# Patient Record
Sex: Male | Born: 1950
Health system: Southern US, Community
[De-identification: ages and names within clinical notes are randomized; demographics above are authoritative.]

## PROBLEM LIST (undated history)

## (undated) DIAGNOSIS — E785 Hyperlipidemia, unspecified: Secondary | ICD-10-CM

## (undated) DIAGNOSIS — Z972 Presence of dental prosthetic device (complete) (partial): Secondary | ICD-10-CM

## (undated) DIAGNOSIS — I10 Essential (primary) hypertension: Secondary | ICD-10-CM

## (undated) DIAGNOSIS — C801 Malignant (primary) neoplasm, unspecified: Secondary | ICD-10-CM

## (undated) DIAGNOSIS — R310 Gross hematuria: Secondary | ICD-10-CM

## (undated) DIAGNOSIS — M1990 Unspecified osteoarthritis, unspecified site: Secondary | ICD-10-CM

## (undated) DIAGNOSIS — S8000XA Contusion of unspecified knee, initial encounter: Secondary | ICD-10-CM

## (undated) DIAGNOSIS — C61 Malignant neoplasm of prostate: Secondary | ICD-10-CM

## (undated) DIAGNOSIS — G629 Polyneuropathy, unspecified: Secondary | ICD-10-CM

## (undated) DIAGNOSIS — D869 Sarcoidosis, unspecified: Secondary | ICD-10-CM

## (undated) DIAGNOSIS — N261 Atrophy of kidney (terminal): Secondary | ICD-10-CM

## (undated) DIAGNOSIS — K219 Gastro-esophageal reflux disease without esophagitis: Secondary | ICD-10-CM

## (undated) DIAGNOSIS — N200 Calculus of kidney: Secondary | ICD-10-CM

## (undated) DIAGNOSIS — E119 Type 2 diabetes mellitus without complications: Secondary | ICD-10-CM

## (undated) DIAGNOSIS — Z974 Presence of external hearing-aid: Secondary | ICD-10-CM

## (undated) DIAGNOSIS — Z9889 Other specified postprocedural states: Secondary | ICD-10-CM

## (undated) DIAGNOSIS — N529 Male erectile dysfunction, unspecified: Secondary | ICD-10-CM

## (undated) DIAGNOSIS — J449 Chronic obstructive pulmonary disease, unspecified: Secondary | ICD-10-CM

## (undated) DIAGNOSIS — E041 Nontoxic single thyroid nodule: Secondary | ICD-10-CM

## (undated) DIAGNOSIS — Z903 Acquired absence of stomach [part of]: Secondary | ICD-10-CM

## (undated) DIAGNOSIS — K279 Peptic ulcer, site unspecified, unspecified as acute or chronic, without hemorrhage or perforation: Secondary | ICD-10-CM

## (undated) HISTORY — PX: APPENDECTOMY: SHX54

## (undated) HISTORY — DX: Atrophy of kidney (terminal): N26.1

## (undated) HISTORY — DX: Other specified postprocedural states: Z98.890

## (undated) HISTORY — DX: Gastro-esophageal reflux disease without esophagitis: K21.9

## (undated) HISTORY — PX: EYE SURGERY: SHX253

## (undated) HISTORY — PX: OTHER SURGICAL HISTORY: SHX169

## (undated) HISTORY — DX: Sarcoidosis, unspecified: D86.9

## (undated) HISTORY — DX: Peptic ulcer, site unspecified, unspecified as acute or chronic, without hemorrhage or perforation: K27.9

## (undated) HISTORY — DX: Acquired absence of stomach (part of): Z90.3

## (undated) HISTORY — DX: Hyperlipidemia, unspecified: E78.5

## (undated) HISTORY — DX: Contusion of unspecified knee, initial encounter: S80.00XA

## (undated) HISTORY — DX: Male erectile dysfunction, unspecified: N52.9

## (undated) HISTORY — DX: Polyneuropathy, unspecified: G62.9

## (undated) HISTORY — DX: Gross hematuria: R31.0

## (undated) HISTORY — DX: Nontoxic single thyroid nodule: E04.1

## (undated) HISTORY — DX: Essential (primary) hypertension: I10

## (undated) HISTORY — DX: Chronic obstructive pulmonary disease, unspecified: J44.9

## (undated) HISTORY — DX: Calculus of kidney: N20.0

## (undated) HISTORY — DX: Unspecified osteoarthritis, unspecified site: M19.90

## (undated) HISTORY — PX: CHOLECYSTECTOMY: SHX55

## (undated) HISTORY — DX: Type 2 diabetes mellitus without complications: E11.9

## (undated) HISTORY — PX: GASTRECTOMY: SHX58

---

## 2000-05-04 HISTORY — PX: INSERTION PROSTATE RADIATION SEED: SUR718

## 2012-10-11 DIAGNOSIS — Z903 Acquired absence of stomach [part of]: Secondary | ICD-10-CM

## 2012-10-11 DIAGNOSIS — Z9889 Other specified postprocedural states: Secondary | ICD-10-CM | POA: Insufficient documentation

## 2012-10-11 HISTORY — DX: Acquired absence of stomach (part of): Z90.3

## 2012-10-11 HISTORY — DX: Other specified postprocedural states: Z98.890

## 2013-01-13 DIAGNOSIS — N261 Atrophy of kidney (terminal): Secondary | ICD-10-CM

## 2013-01-13 HISTORY — DX: Atrophy of kidney (terminal): N26.1

## 2013-07-12 DIAGNOSIS — E559 Vitamin D deficiency, unspecified: Secondary | ICD-10-CM | POA: Insufficient documentation

## 2013-10-20 DIAGNOSIS — S8000XA Contusion of unspecified knee, initial encounter: Secondary | ICD-10-CM

## 2013-10-20 HISTORY — DX: Contusion of unspecified knee, initial encounter: S80.00XA

## 2014-07-17 DIAGNOSIS — N200 Calculus of kidney: Secondary | ICD-10-CM | POA: Insufficient documentation

## 2015-04-04 DIAGNOSIS — E785 Hyperlipidemia, unspecified: Secondary | ICD-10-CM | POA: Insufficient documentation

## 2015-04-04 DIAGNOSIS — D869 Sarcoidosis, unspecified: Secondary | ICD-10-CM

## 2015-04-04 HISTORY — DX: Sarcoidosis, unspecified: D86.9

## 2016-03-16 ENCOUNTER — Ambulatory Visit
Admission: EM | Admit: 2016-03-16 | Discharge: 2016-03-16 | Disposition: A | Discharge status: Home / Self Care | Payer: Medicare Other | Attending: Family Medicine | Admitting: Family Medicine

## 2016-03-16 ENCOUNTER — Encounter: Payer: Self-pay | Admitting: *Deleted

## 2016-03-16 DIAGNOSIS — R109 Unspecified abdominal pain: Secondary | ICD-10-CM | POA: Insufficient documentation

## 2016-03-16 DIAGNOSIS — Z8546 Personal history of malignant neoplasm of prostate: Secondary | ICD-10-CM | POA: Diagnosis not present

## 2016-03-16 DIAGNOSIS — N3090 Cystitis, unspecified without hematuria: Secondary | ICD-10-CM

## 2016-03-16 DIAGNOSIS — N3091 Cystitis, unspecified with hematuria: Secondary | ICD-10-CM | POA: Insufficient documentation

## 2016-03-16 DIAGNOSIS — Z87891 Personal history of nicotine dependence: Secondary | ICD-10-CM | POA: Insufficient documentation

## 2016-03-16 DIAGNOSIS — Z87442 Personal history of urinary calculi: Secondary | ICD-10-CM | POA: Diagnosis not present

## 2016-03-16 DIAGNOSIS — Z9049 Acquired absence of other specified parts of digestive tract: Secondary | ICD-10-CM | POA: Diagnosis not present

## 2016-03-16 DIAGNOSIS — Z79899 Other long term (current) drug therapy: Secondary | ICD-10-CM | POA: Diagnosis not present

## 2016-03-16 DIAGNOSIS — Z9889 Other specified postprocedural states: Secondary | ICD-10-CM | POA: Insufficient documentation

## 2016-03-16 HISTORY — DX: Malignant neoplasm of prostate: C61

## 2016-03-16 HISTORY — DX: Malignant (primary) neoplasm, unspecified: C80.1

## 2016-03-16 LAB — URINALYSIS COMPLETE WITH MICROSCOPIC (ARMC ONLY)
GLUCOSE, UA: NEGATIVE mg/dL
KETONES UR: NEGATIVE mg/dL
NITRITE: NEGATIVE
Protein, ur: >300 mg/dL — AB
Specific Gravity, Urine: 1.015 (ref 1.005–1.030)
Squamous Epithelial / LPF: NONE SEEN
pH: 5 (ref 5.0–8.0)

## 2016-03-16 LAB — BASIC METABOLIC PANEL
Anion gap: 9 (ref 5–15)
BUN: 21 mg/dL — ABNORMAL HIGH (ref 6–20)
CHLORIDE: 102 mmol/L (ref 101–111)
CO2: 27 mmol/L (ref 22–32)
CREATININE: 1.36 mg/dL — AB (ref 0.61–1.24)
Calcium: 9.6 mg/dL (ref 8.9–10.3)
GFR calc non Af Amer: 52 mL/min — ABNORMAL LOW (ref >60)
GLUCOSE: 83 mg/dL (ref 65–99)
Potassium: 4 mmol/L (ref 3.5–5.1)
Sodium: 138 mmol/L (ref 135–145)

## 2016-03-16 MED ORDER — CIPROFLOXACIN HCL 500 MG PO TABS: 500.0000 mg | ORAL_TABLET | Freq: Two times a day (BID) | ORAL | 0 refills | Status: DC

## 2016-03-16 MED ORDER — CIPROFLOXACIN HCL 500 MG PO TABS: ORAL_TABLET | ORAL | 0 refills | Status: DC

## 2016-03-16 NOTE — ED Provider Notes (Signed)
[facility]    CSN: [id number] Arrival date & time: 03/16/16  0854     History   Chief Complaint Chief Complaint  Patient presents with  . Hematuria  . Flank Pain    HPI Juan [name] is a 65 y.o. male.   The history is provided by the patient.  Hematuria  This is a new problem. The current episode started 2 days ago. The problem occurs constantly. The problem has not changed since onset.Associated symptoms comments: Denies any fevers, chills, dysuria, vomiting, abdominal pain, flank pain.   Patient reports a h/o kidney stones but symptoms different (pain present) in the past. Also has a history of prostate cancer and receives Lupron treatments. . He has tried nothing for the symptoms.  Flank Pain     Past Medical History:  Diagnosis Date  . Cancer ([place])   . Prostate cancer ([place])     There are no active problems to display for this patient.   Past Surgical History:  Procedure Laterality Date  . APPENDECTOMY    . CHOLECYSTECTOMY    . GASTRECTOMY    . nasal tumor         Home Medications    Prior to Admission medications   Medication Sig Start Date End Date Taking? Authorizing Provider  atorvastatin (LIPITOR) 10 MG tablet Take 10 mg by mouth daily.   Yes Historical Provider, MD  dutasteride (AVODART) 0.5 MG capsule Take 0.5 mg by mouth daily.   Yes Historical Provider, MD  leuprolide (LUPRON) 3.75 MG injection Inject 3.75 mg into the muscle once.   Yes Historical Provider, MD  pantoprazole (PROTONIX) 40 MG tablet Take 40 mg by mouth daily.   Yes Historical Provider, MD  ciprofloxacin (CIPRO) 500 MG tablet 1 tab po qd 03/16/16   Norval Gable, MD    Family History History reviewed. No pertinent family history.  Social History Social History  Substance Use Topics  . Smoking status: Former Research scientist (life sciences)  . Smokeless tobacco: Never Used  . Alcohol use Yes     Allergies   Nsaids   Review of Systems Review of Systems  Genitourinary:  Positive for flank pain and hematuria.     Physical Exam Triage Vital Signs ED Triage Vitals  Enc Vitals Group     BP 03/16/16 0944 (!) 166/78     Pulse Rate 03/16/16 0944 (!) 54     Resp 03/16/16 0944 16     Temp 03/16/16 0944 98.4 F (36.9 C)     Temp Source 03/16/16 0944 Oral     SpO2 03/16/16 0944 98 %     Weight 03/16/16 0946 215 lb (97.5 kg)     Height 03/16/16 0946 5\' 8"  (1.727 m)     Head Circumference --      Peak Flow --      Pain Score 03/16/16 1100 1     Pain Loc --      Pain Edu? --      Excl. in [place]? --    No data found.   Updated Vital Signs BP (!) 166/78 (BP Location: Right Arm)   Pulse (!) 54   Temp 98.4 F (36.9 C) (Oral)   Resp 16   Ht 5\' 8"  (1.727 m)   Wt 215 lb (97.5 kg)   SpO2 98%   BMI 32.69 kg/m   Visual Acuity Right Eye Distance:   Left Eye Distance:   Bilateral Distance:    Right Eye Near:   Left  Eye Near:    Bilateral Near:     Physical Exam  Constitutional: He is oriented to person, place, and time. He appears well-developed and well-nourished. No distress.  HENT:  Head: Normocephalic and atraumatic.  Cardiovascular: Normal rate.   No murmur heard. Pulmonary/Chest: Effort normal. No respiratory distress.  Abdominal: Soft. Bowel sounds are normal. He exhibits no distension and no mass. There is no tenderness. There is no rebound and no guarding.  Genitourinary: Prostate is tender.  Neurological: He is alert and oriented to person, place, and time.  Skin: No rash noted. He is not diaphoretic.  Nursing note and vitals reviewed.    UC Treatments / Results  Labs (all labs ordered are listed, but only abnormal results are displayed) Labs Reviewed  URINALYSIS COMPLETEWITH MICROSCOPIC ([place]) - Abnormal; Notable for the following:       Result Value   Color, Urine BROWN (*)    APPearance TURBID (*)    Bilirubin Urine SMALL (*)    Hgb urine dipstick LARGE (*)    Protein, ur >300 (*)    Leukocytes, UA SMALL (*)     Bacteria, UA FEW (*)    All other components within normal limits  BASIC METABOLIC PANEL - Abnormal; Notable for the following:    BUN 21 (*)    Creatinine, Ser 1.36 (*)    GFR calc non Af Amer 52 (*)    All other components within normal limits    EKG  EKG Interpretation None       Radiology No results found.  Procedures Procedures (including critical care time)  Medications Ordered in UC Medications - No data to display   Initial Impression / Assessment and Plan / UC Course  I have reviewed the triage vital signs and the nursing notes.  Pertinent labs & imaging results that were available during my care of the patient were reviewed by me and considered in my medical decision making (see chart for details).  Clinical Course       Final Clinical Impressions(s) / UC Diagnoses   Final diagnoses:  Cystitis    New Prescriptions Discharge Medication List as of 03/16/2016 10:55 AM    START taking these medications   Details  ciprofloxacin (CIPRO) 500 MG tablet Take 1 tablet (500 mg total) by mouth every 12 (twelve) hours., Starting Mon 03/16/2016, Normal       1. Lab results and diagnosis reviewed with patient 2. rx as per orders above; reviewed possible side effects, interactions, risks and benefits  3. Recommend supportive treatment with increased fluids 4. Follow-up prn if symptoms worsen or don't improve   Norval Gable, MD 03/16/16 1140

## 2016-03-16 NOTE — ED Triage Notes (Signed)
Hx of prostate cancer.

## 2016-03-16 NOTE — ED Triage Notes (Signed)
Hematuria since yesterday, mild left flank pain this am. Hx of previous kidney stones. Left kidney non-functional from repeated stones.

## 2016-05-13 DIAGNOSIS — N390 Urinary tract infection, site not specified: Secondary | ICD-10-CM | POA: Diagnosis not present

## 2016-05-28 DIAGNOSIS — E559 Vitamin D deficiency, unspecified: Secondary | ICD-10-CM | POA: Diagnosis not present

## 2016-05-28 DIAGNOSIS — C61 Malignant neoplasm of prostate: Secondary | ICD-10-CM | POA: Diagnosis not present

## 2016-07-09 DIAGNOSIS — Z87891 Personal history of nicotine dependence: Secondary | ICD-10-CM | POA: Diagnosis not present

## 2016-07-09 DIAGNOSIS — E559 Vitamin D deficiency, unspecified: Secondary | ICD-10-CM | POA: Diagnosis not present

## 2016-07-09 DIAGNOSIS — C61 Malignant neoplasm of prostate: Secondary | ICD-10-CM | POA: Diagnosis not present

## 2016-07-10 DIAGNOSIS — K219 Gastro-esophageal reflux disease without esophagitis: Secondary | ICD-10-CM | POA: Diagnosis not present

## 2016-07-10 DIAGNOSIS — E669 Obesity, unspecified: Secondary | ICD-10-CM | POA: Diagnosis not present

## 2016-07-10 DIAGNOSIS — E559 Vitamin D deficiency, unspecified: Secondary | ICD-10-CM | POA: Diagnosis not present

## 2016-07-10 DIAGNOSIS — K912 Postsurgical malabsorption, not elsewhere classified: Secondary | ICD-10-CM | POA: Diagnosis not present

## 2016-07-10 DIAGNOSIS — Z713 Dietary counseling and surveillance: Secondary | ICD-10-CM | POA: Diagnosis not present

## 2016-07-10 DIAGNOSIS — Z9884 Bariatric surgery status: Secondary | ICD-10-CM | POA: Diagnosis not present

## 2016-07-10 DIAGNOSIS — Z9889 Other specified postprocedural states: Secondary | ICD-10-CM | POA: Diagnosis not present

## 2016-07-10 DIAGNOSIS — Z903 Acquired absence of stomach [part of]: Secondary | ICD-10-CM | POA: Diagnosis not present

## 2016-07-21 DIAGNOSIS — R8290 Unspecified abnormal findings in urine: Secondary | ICD-10-CM | POA: Diagnosis not present

## 2016-07-21 DIAGNOSIS — N200 Calculus of kidney: Secondary | ICD-10-CM | POA: Diagnosis not present

## 2016-07-21 DIAGNOSIS — R310 Gross hematuria: Secondary | ICD-10-CM | POA: Diagnosis not present

## 2016-07-22 DIAGNOSIS — R310 Gross hematuria: Secondary | ICD-10-CM | POA: Insufficient documentation

## 2016-07-22 HISTORY — DX: Gross hematuria: R31.0

## 2016-08-07 DIAGNOSIS — H35342 Macular cyst, hole, or pseudohole, left eye: Secondary | ICD-10-CM | POA: Diagnosis not present

## 2016-08-20 DIAGNOSIS — E559 Vitamin D deficiency, unspecified: Secondary | ICD-10-CM | POA: Diagnosis not present

## 2016-08-20 DIAGNOSIS — C61 Malignant neoplasm of prostate: Secondary | ICD-10-CM | POA: Diagnosis not present

## 2016-08-27 ENCOUNTER — Other Ambulatory Visit: Payer: Self-pay

## 2016-08-27 DIAGNOSIS — Z87442 Personal history of urinary calculi: Secondary | ICD-10-CM

## 2016-08-28 ENCOUNTER — Other Ambulatory Visit
Admission: RE | Admit: 2016-08-28 | Discharge: 2016-08-28 | Disposition: A | Discharge status: Home / Self Care | Payer: Medicare Other | Source: Ambulatory Visit | Attending: Urology | Admitting: Urology

## 2016-08-28 ENCOUNTER — Encounter: Payer: Self-pay | Admitting: Urology

## 2016-08-28 ENCOUNTER — Ambulatory Visit (INDEPENDENT_AMBULATORY_CARE_PROVIDER_SITE_OTHER): Payer: Medicare Other | Admitting: Urology

## 2016-08-28 VITALS — BP 175/93 | HR 53 | Ht 68.0 in | Wt 218.0 lb

## 2016-08-28 DIAGNOSIS — R310 Gross hematuria: Secondary | ICD-10-CM

## 2016-08-28 DIAGNOSIS — Z8719 Personal history of other diseases of the digestive system: Secondary | ICD-10-CM | POA: Insufficient documentation

## 2016-08-28 DIAGNOSIS — N261 Atrophy of kidney (terminal): Secondary | ICD-10-CM | POA: Diagnosis not present

## 2016-08-28 DIAGNOSIS — C61 Malignant neoplasm of prostate: Secondary | ICD-10-CM | POA: Diagnosis not present

## 2016-08-28 DIAGNOSIS — E119 Type 2 diabetes mellitus without complications: Secondary | ICD-10-CM

## 2016-08-28 DIAGNOSIS — N200 Calculus of kidney: Secondary | ICD-10-CM | POA: Insufficient documentation

## 2016-08-28 DIAGNOSIS — K279 Peptic ulcer, site unspecified, unspecified as acute or chronic, without hemorrhage or perforation: Secondary | ICD-10-CM

## 2016-08-28 DIAGNOSIS — Z87442 Personal history of urinary calculi: Secondary | ICD-10-CM | POA: Diagnosis not present

## 2016-08-28 DIAGNOSIS — J449 Chronic obstructive pulmonary disease, unspecified: Secondary | ICD-10-CM

## 2016-08-28 DIAGNOSIS — I10 Essential (primary) hypertension: Secondary | ICD-10-CM

## 2016-08-28 DIAGNOSIS — E669 Obesity, unspecified: Secondary | ICD-10-CM | POA: Insufficient documentation

## 2016-08-28 DIAGNOSIS — E1140 Type 2 diabetes mellitus with diabetic neuropathy, unspecified: Secondary | ICD-10-CM | POA: Insufficient documentation

## 2016-08-28 DIAGNOSIS — K219 Gastro-esophageal reflux disease without esophagitis: Secondary | ICD-10-CM | POA: Insufficient documentation

## 2016-08-28 DIAGNOSIS — N529 Male erectile dysfunction, unspecified: Secondary | ICD-10-CM | POA: Insufficient documentation

## 2016-08-28 DIAGNOSIS — M1990 Unspecified osteoarthritis, unspecified site: Secondary | ICD-10-CM

## 2016-08-28 DIAGNOSIS — E785 Hyperlipidemia, unspecified: Secondary | ICD-10-CM | POA: Insufficient documentation

## 2016-08-28 DIAGNOSIS — Z8639 Personal history of other endocrine, nutritional and metabolic disease: Secondary | ICD-10-CM | POA: Insufficient documentation

## 2016-08-28 HISTORY — DX: Chronic obstructive pulmonary disease, unspecified: J44.9

## 2016-08-28 HISTORY — DX: Unspecified osteoarthritis, unspecified site: M19.90

## 2016-08-28 HISTORY — DX: Peptic ulcer, site unspecified, unspecified as acute or chronic, without hemorrhage or perforation: K27.9

## 2016-08-28 HISTORY — DX: Calculus of kidney: N20.0

## 2016-08-28 HISTORY — DX: Type 2 diabetes mellitus without complications: E11.9

## 2016-08-28 HISTORY — DX: Male erectile dysfunction, unspecified: N52.9

## 2016-08-28 HISTORY — DX: Essential (primary) hypertension: I10

## 2016-08-28 LAB — URINALYSIS, COMPLETE (UACMP) WITH MICROSCOPIC
Bilirubin Urine: NEGATIVE
GLUCOSE, UA: NEGATIVE mg/dL
KETONES UR: NEGATIVE mg/dL
Nitrite: NEGATIVE
PROTEIN: NEGATIVE mg/dL
SQUAMOUS EPITHELIAL / LPF: NONE SEEN
Specific Gravity, Urine: 1.01 (ref 1.005–1.030)
pH: 5.5 (ref 5.0–8.0)

## 2016-08-28 NOTE — Progress Notes (Signed)
08/28/2016 2:17 PM   Juan [name] 1950/06/10 [id number]  Referring provider: Donzetta Kohut, DO [address] [place], [place] [id number]  Chief Complaint  Patient presents with  . Prostate Cancer    New Patient  . Establish Care    HPI: 65 year old male here to establish care for history of prostate cancer. He was previously seeing a urologist in [place] (Dr. Edwin Dada) but recently moved to [facility] and seeking local care.    He has a personal history of a nonfunctioning left kidney, possibly secondary to an embolic event. He also has a history of left ureteral stones and stricture without return of function following stricture dilations and stent placement. Based on a Lasix renogram from 9/14, left kidney provides only approximate 5% of overall function. He has a known 5 mm right lower pole stone.   He also has a personal history of castrate resistant prostate cancer. He underwent primary brachytherapy in 2003 and has been managed since with intermittent ADT and is followed by Dr. Alvester Chou.  He is currently on Casodex, Avodart, and Lupron.    Most recently, he had 2 episodes of painless gross hematuria this past December and January. He had no associated dysuria, or flank pain. He is counseled to undergo workup for his hematuria which has not yet been performed.  He does have a personal history of CKD stage III. His most recent creatinine was 1.29 on 07/21/2016.   PMH: Past Medical History:  Diagnosis Date  . Arthritis 08/28/2016  . Atrophic kidney 01/13/2013   Last Assessment & Plan:  Atrophic and nonfunctional LEFT kidney.  Discussed at length today. Asymptomatic. No intervention needed.  Overview:  Last Assessment & Plan:  Atrophic and nonfunctional LEFT kidney.  Discussed at length today. Asymptomatic. No intervention needed. Last Assessment & Plan:  Atrophic and nonfunctional LEFT kidney.  Discussed at length today. Asymptomatic. No intervention needed.  . Cancer  ([place])   . COPD (chronic obstructive pulmonary disease) ([place]) 08/28/2016  . Diabetes mellitus type 2, uncomplicated ([place]) 06/27/8248  . ED (erectile dysfunction) 08/28/2016  . GERD (gastroesophageal reflux disease)   . Gross hematuria 07/22/2016   Overview:  Last Assessment & Plan:  The differential diagnosis for hematuria was reviewed.  Possible etiologies include, but are not limited to urolithiasis, infection, urothelial or renal malignancies, medical renal disease, and benign idiopathic hematuria.  The workup was reviewed and he will need a urinary cytology testing, a CT urogram, and Cystourethroscopy.  He has moved to [place], [place] and would like to have all this done with a local urologist since it is an hour and a half drive. Will send records there and have the work performed locally.   > 15 minutes were spent in face to face contact with the patient, >50% of which was spent counseling about about the diagnosis and plan.  Last Assessment & Plan:  The differential diagnosis for hematuria was reviewed.  Possible etiologies include, but are not limited to urolithiasis, infection, urothelial or renal malignancies, medical renal disease, and benign idiopathic hematuria.  The workup was reviewed and he will need a urinary cytology testing,   . H/O vagotomy 10/11/2012  . Hyperlipemia   . Hypertension 08/28/2016  . Kidney stones 08/28/2016   Last Assessment & Plan:  Can't see stone on KUB today again  Pt instructed to call or go to ER for acute flank pain since he only has one functioning kidney.  Currently no evidence of any obstructive  uropathy.  Check chem 8 and call with results.  F/u one year for KUB  . Knee contusion 10/20/2013  . Peptic ulcer 08/28/2016  . Prostate cancer ([place])   . Sarcoidosis 04/04/2015  . Status post partial gastrectomy 10/11/2012    Surgical History: Past Surgical History:  Procedure Laterality Date  . APPENDECTOMY    . CHOLECYSTECTOMY    . GASTRECTOMY    . INSERTION  PROSTATE RADIATION SEED  2002   Brachytherapy  . nasal tumor      Home Medications:  Allergies as of 08/28/2016      Reactions   Nsaids Other (See Comments)   Hx of PUD      Medication List       Accurate as of 08/28/16 11:59 PM. Always use your most recent med list.          atorvastatin 10 MG tablet Commonly known as:  LIPITOR Take 10 mg by mouth daily.   bicalutamide 50 MG tablet Commonly known as:  CASODEX   dutasteride 0.5 MG capsule Commonly known as:  AVODART Take 0.5 mg by mouth daily.   leuprolide 3.75 MG injection Commonly known as:  LUPRON Inject 3.75 mg into the muscle once.   pantoprazole 40 MG tablet Commonly known as:  PROTONIX Take 40 mg by mouth daily.       Allergies:  Allergies  Allergen Reactions  . Nsaids Other (See Comments)    Hx of PUD    Family History: Family History  Problem Relation Age of Onset  . Prostate cancer Neg Hx   . Kidney cancer Neg Hx     Social History:  reports that he has quit smoking. He has never used smokeless tobacco. He reports that he drinks alcohol. He reports that he does not use drugs.  ROS: UROLOGY Frequent Urination?: No Hard to postpone urination?: No Burning/pain with urination?: No Get up at night to urinate?: Yes Leakage of urine?: No Urine stream starts and stops?: Yes Trouble starting stream?: No Do you have to strain to urinate?: No Blood in urine?: Yes Urinary tract infection?: Yes Sexually transmitted disease?: No Injury to kidneys or bladder?: No Painful intercourse?: No Weak stream?: No Erection problems?: No Penile pain?: No  Gastrointestinal Nausea?: No Vomiting?: No Indigestion/heartburn?: No Diarrhea?: No Constipation?: No  Constitutional Fever: No Night sweats?: No Weight loss?: No Fatigue?: No  Skin Skin rash/lesions?: No Itching?: No  Eyes Blurred vision?: Yes Double vision?: No  Ears/Nose/Throat Sore throat?: No Sinus problems?:  No  Hematologic/Lymphatic Swollen glands?: No Easy bruising?: Yes  Cardiovascular Leg swelling?: No Chest pain?: No  Respiratory Cough?: No Shortness of breath?: No  Endocrine Excessive thirst?: No  Musculoskeletal Back pain?: Yes Joint pain?: Yes  Neurological Headaches?: No Dizziness?: No  Psychologic Depression?: No Anxiety?: No  Physical Exam: BP (!) 175/93   Pulse (!) 53   Ht 5\' 8"  (1.727 m)   Wt 218 lb (98.9 kg)   BMI 33.15 kg/m   Constitutional:  Alert and oriented, No acute distress. HEENT: [place] AT, moist mucus membranes.  Trachea midline, no masses. Cardiovascular: No clubbing, cyanosis, or edema. Respiratory: Normal respiratory effort, no increased work of breathing. GI: Abdomen is soft, nontender, nondistended, no abdominal masses GU: No CVA tenderness.  Skin: No rashes, bruises or suspicious lesions. Lymph: No cervical or inguinal adenopathy. Neurologic: Grossly intact, no focal deficits, moving all 4 extremities. Psychiatric: Normal mood and affect.  Laboratory Data:  Lab Results  Component Value Date  CREATININE 1.36 (H) 03/16/2016   PSA 0.61 on 08/20/16  Urinalysis Results for orders placed or performed during the hospital encounter of 08/28/16  Urinalysis, Complete w Microscopic  Result Value Ref Range   Color, Urine YELLOW YELLOW   APPearance CLEAR CLEAR   Specific Gravity, Urine 1.010 1.005 - 1.030   pH 5.5 5.0 - 8.0   Glucose, UA NEGATIVE NEGATIVE mg/dL   Hgb urine dipstick TRACE (A) NEGATIVE   Bilirubin Urine NEGATIVE NEGATIVE   Ketones, ur NEGATIVE NEGATIVE mg/dL   Protein, ur NEGATIVE NEGATIVE mg/dL   Nitrite NEGATIVE NEGATIVE   Leukocytes, UA TRACE (A) NEGATIVE   Squamous Epithelial / LPF NONE SEEN NONE SEEN   WBC, UA 6-30 0 - 5 WBC/hpf   RBC / HPF 0-5 0 - 5 RBC/hpf   Bacteria, UA RARE (A) NONE SEEN    Pertinent Imaging: n/a  Assessment & Plan:    1. Gross hematuria Episodes of painless gross hematuria, one episode  presumably related to infection although urine culture was not sent  Given his history of radiation and gross hematuria, I have recommended proceeding with gross hematuria workup including CT urogram, cystoscopy, and possibly urine cytology. He is agreeable with this plan.  - CT HEMATURIA WORKUP; Future  2. Prostate cancer ([place]) Castrate resistant prostate cancer managed on intermittent ADT Followed closely by Dr. Gwenlyn Found at [facility]  3. History of nephrolithiasis Known 5 mm nonobstructing calculus Will assess for any further stones on CT urogram Current asymptomatic  4. Left renal atrophy Per report, less than 5% function despite ureteral stenting Continue conservative management   Return in about 2 weeks (around 09/11/2016) for cystoscopy/ follow up [place], [place] [address], [place], [place] [address] [phone]

## 2016-09-01 DIAGNOSIS — H34831 Tributary (branch) retinal vein occlusion, right eye, with macular edema: Secondary | ICD-10-CM | POA: Diagnosis not present

## 2016-09-01 DIAGNOSIS — H35342 Macular cyst, hole, or pseudohole, left eye: Secondary | ICD-10-CM | POA: Diagnosis not present

## 2016-09-02 DIAGNOSIS — E1122 Type 2 diabetes mellitus with diabetic chronic kidney disease: Secondary | ICD-10-CM | POA: Diagnosis not present

## 2016-09-02 DIAGNOSIS — N182 Chronic kidney disease, stage 2 (mild): Secondary | ICD-10-CM | POA: Diagnosis not present

## 2016-09-10 DIAGNOSIS — Z0000 Encounter for general adult medical examination without abnormal findings: Secondary | ICD-10-CM | POA: Diagnosis not present

## 2016-09-10 DIAGNOSIS — E118 Type 2 diabetes mellitus with unspecified complications: Secondary | ICD-10-CM | POA: Diagnosis not present

## 2016-09-10 DIAGNOSIS — Z6834 Body mass index (BMI) 34.0-34.9, adult: Secondary | ICD-10-CM | POA: Diagnosis not present

## 2016-09-10 DIAGNOSIS — Z9289 Personal history of other medical treatment: Secondary | ICD-10-CM | POA: Diagnosis not present

## 2016-09-14 ENCOUNTER — Ambulatory Visit
Admission: RE | Admit: 2016-09-14 | Discharge: 2016-09-14 | Disposition: A | Discharge status: Home / Self Care | Payer: Medicare Other | Source: Ambulatory Visit | Attending: Urology | Admitting: Urology

## 2016-09-14 DIAGNOSIS — I700 Atherosclerosis of aorta: Secondary | ICD-10-CM | POA: Insufficient documentation

## 2016-09-14 DIAGNOSIS — I2510 Atherosclerotic heart disease of native coronary artery without angina pectoris: Secondary | ICD-10-CM | POA: Diagnosis not present

## 2016-09-14 DIAGNOSIS — N200 Calculus of kidney: Secondary | ICD-10-CM | POA: Insufficient documentation

## 2016-09-14 DIAGNOSIS — R310 Gross hematuria: Secondary | ICD-10-CM | POA: Diagnosis not present

## 2016-09-14 DIAGNOSIS — C61 Malignant neoplasm of prostate: Secondary | ICD-10-CM | POA: Diagnosis not present

## 2016-09-14 MED ORDER — IOPAMIDOL (ISOVUE-300) INJECTION 61%
125.0000 mL | Freq: Once | INTRAVENOUS | Status: AC | PRN
  Administered 2016-09-14: 125 mL via INTRAVENOUS

## 2016-09-17 ENCOUNTER — Other Ambulatory Visit: Payer: Self-pay

## 2016-09-17 DIAGNOSIS — R310 Gross hematuria: Secondary | ICD-10-CM

## 2016-09-18 ENCOUNTER — Encounter: Payer: Self-pay | Admitting: Urology

## 2016-09-18 ENCOUNTER — Ambulatory Visit (INDEPENDENT_AMBULATORY_CARE_PROVIDER_SITE_OTHER): Payer: Medicare Other | Admitting: Urology

## 2016-09-18 ENCOUNTER — Other Ambulatory Visit
Admission: RE | Admit: 2016-09-18 | Discharge: 2016-09-18 | Disposition: A | Discharge status: Home / Self Care | Payer: Medicare Other | Source: Ambulatory Visit | Attending: Urology | Admitting: Urology

## 2016-09-18 VITALS — BP 177/98 | HR 60 | Ht 68.0 in | Wt 215.0 lb

## 2016-09-18 DIAGNOSIS — R310 Gross hematuria: Secondary | ICD-10-CM | POA: Diagnosis not present

## 2016-09-18 DIAGNOSIS — C61 Malignant neoplasm of prostate: Secondary | ICD-10-CM

## 2016-09-18 DIAGNOSIS — N261 Atrophy of kidney (terminal): Secondary | ICD-10-CM

## 2016-09-18 DIAGNOSIS — Z87442 Personal history of urinary calculi: Secondary | ICD-10-CM

## 2016-09-18 MED ORDER — LIDOCAINE HCL 2 % EX GEL
1.0000 "application " | Freq: Once | CUTANEOUS | Status: AC
  Administered 2016-09-18: 1 via URETHRAL

## 2016-09-18 MED ORDER — CIPROFLOXACIN HCL 500 MG PO TABS
500.0000 mg | ORAL_TABLET | Freq: Once | ORAL | Status: AC
  Administered 2016-09-18: 500 mg via ORAL

## 2016-09-18 NOTE — Progress Notes (Signed)
09/18/16  CC: cystoscopy  HPI: 66 year old male who recently established care in our office undergoing  hematuria workup.  He had 2 episodes of painless gross hematuria in December and January.  He has a personal history of a nonfunctioning left kidney, castrate resistant prostate cancer managed by Dr. Alvester Chou at [facility], and known nephrolithiasis.  CT urogram personally reviewed today as below.  Blood pressure (!) 177/98, pulse 60, height 5\' 8"  (1.727 m), weight 215 lb (97.5 kg). NED. A&Ox3.   No respiratory distress   Abd soft, NT, ND Normal phallus with bilateral descended testicles  CT Urogram CLINICAL DATA:  Gross hematuria, sarcoid.  Prostate cancer.  EXAM: CT ABDOMEN AND PELVIS WITHOUT AND WITH CONTRAST  TECHNIQUE: Multidetector CT imaging of the abdomen and pelvis was performed following the standard protocol before and following the bolus administration of intravenous contrast.  CONTRAST:  110mL ISOVUE-300 IOPAMIDOL (ISOVUE-300) INJECTION 61%  COMPARISON:  None.  FINDINGS: Lower chest: Few scattered millimetric pulmonary nodules are nonspecific. Heart size normal. Coronary artery calcification. No pericardial or pleural effusion. Postoperative changes at the gastroesophageal junction.  Hepatobiliary: The liver is unremarkable. Cholecystectomy. No biliary ductal dilatation.  Pancreas: Negative.  Spleen: Negative.  Adrenals/Urinary Tract: Adrenal glands are unremarkable. Stones are seen in the kidneys bilaterally. Left kidney is atrophic. 1.6 cm low-attenuation lesion in the left kidney shows no enhancement, consistent with a cyst. Ureters are decompressed. No filling defects in the right intrarenal collecting system or right ureter. Left intrarenal collecting system and left ureter are poorly opacified. Bladder is unremarkable.  Stomach/Bowel: Gastric bypass. Stomach, small bowel and colon are otherwise unremarkable. Appendix is not readily  visualized.  Vascular/Lymphatic: Atherosclerotic calcification of the arterial vasculature without abdominal aortic aneurysm. No pathologically enlarged lymph nodes.  Reproductive: Brachytherapy seeds are seen in the prostate.  Other: No free fluid.  Mesenteries and peritoneum are unremarkable.  Musculoskeletal: Degenerative changes in the spine. No worrisome lytic or sclerotic lesions.  IMPRESSION: 1. Bilateral renal stones. Left intrarenal collecting system and left ureter are poorly opacified, limiting evaluation. Otherwise, no findings to explain the patient's hematuria. 2. Aortic atherosclerosis (ICD10-170.0). Coronary artery calcification.   Electronically Signed   By: Lorin Picket M.D.   On: 09/14/2016 11:41  Cystoscopy Procedure Note  Patient identification was confirmed, informed consent was obtained, and patient was prepped using Betadine solution.  Lidocaine jelly was administered per urethral meatus.    Preoperative abx where received prior to procedure.     Pre-Procedure: - Inspection reveals a normal caliber ureteral meatus.  Procedure: The flexible cystoscope was introduced without difficulty - No urethral strictures/lesions are present. - Enlarged prostate with irregular contour, whitish in appearance consistent with known history of prostate cancer status post brachytherapy - Elevated bladder neck - Bilateral ureteral orifices identified - Bladder mucosa  reveals no ulcers, tumors, or lesions - No bladder stones - Minimal trabeculation  Retroflexion shows intravesical protrusion of prostate circumferentially without discrete median lobe.   Post-Procedure: - Patient tolerated the procedure well  Assessment/ Plan:  1. Gross hematuria Episodes of painless gross hematuria, one episode presumably related to infection although urine culture was not sent  Bladder findings as above, consistent with prostate cancer without bladder tumors  CT  urogram as above   2. Prostate cancer ([place]) Castrate resistant prostate cancer managed on intermittent ADT Followed closely by Dr. Gwenlyn Found at [facility]  3. History of nephrolithiasis Small bilateral renal stones on CT Current asymptomatic Will follow conservateyl  4. Left renal atrophy  Per report, less than 5% function despite ureteral stenting Continue conservative management  Return in about 6 months (around 03/21/2017) for UA, IPSS.  Hollice Espy, MD

## 2016-10-01 DIAGNOSIS — C61 Malignant neoplasm of prostate: Secondary | ICD-10-CM | POA: Diagnosis not present

## 2016-10-01 DIAGNOSIS — Z87891 Personal history of nicotine dependence: Secondary | ICD-10-CM | POA: Diagnosis not present

## 2016-10-01 DIAGNOSIS — Z79899 Other long term (current) drug therapy: Secondary | ICD-10-CM | POA: Diagnosis not present

## 2016-10-01 DIAGNOSIS — Z923 Personal history of irradiation: Secondary | ICD-10-CM | POA: Diagnosis not present

## 2016-10-01 DIAGNOSIS — E559 Vitamin D deficiency, unspecified: Secondary | ICD-10-CM | POA: Diagnosis not present

## 2016-10-13 DIAGNOSIS — H34831 Tributary (branch) retinal vein occlusion, right eye, with macular edema: Secondary | ICD-10-CM | POA: Diagnosis not present

## 2016-10-21 DIAGNOSIS — J449 Chronic obstructive pulmonary disease, unspecified: Secondary | ICD-10-CM | POA: Diagnosis not present

## 2016-10-21 DIAGNOSIS — H3589 Other specified retinal disorders: Secondary | ICD-10-CM | POA: Diagnosis not present

## 2016-10-21 DIAGNOSIS — I10 Essential (primary) hypertension: Secondary | ICD-10-CM | POA: Diagnosis not present

## 2016-10-21 DIAGNOSIS — E119 Type 2 diabetes mellitus without complications: Secondary | ICD-10-CM | POA: Diagnosis not present

## 2016-10-21 DIAGNOSIS — Z87891 Personal history of nicotine dependence: Secondary | ICD-10-CM | POA: Diagnosis not present

## 2016-10-21 DIAGNOSIS — H35342 Macular cyst, hole, or pseudohole, left eye: Secondary | ICD-10-CM | POA: Diagnosis not present

## 2016-10-21 DIAGNOSIS — E785 Hyperlipidemia, unspecified: Secondary | ICD-10-CM | POA: Diagnosis not present

## 2016-11-18 DIAGNOSIS — E559 Vitamin D deficiency, unspecified: Secondary | ICD-10-CM | POA: Diagnosis not present

## 2016-11-18 DIAGNOSIS — C61 Malignant neoplasm of prostate: Secondary | ICD-10-CM | POA: Diagnosis not present

## 2016-11-30 DIAGNOSIS — H34831 Tributary (branch) retinal vein occlusion, right eye, with macular edema: Secondary | ICD-10-CM | POA: Diagnosis not present

## 2016-12-21 ENCOUNTER — Telehealth: Payer: Self-pay | Admitting: Urology

## 2016-12-21 ENCOUNTER — Ambulatory Visit (INDEPENDENT_AMBULATORY_CARE_PROVIDER_SITE_OTHER): Payer: Medicare Other

## 2016-12-21 VITALS — BP 174/106 | HR 62 | Ht 67.0 in | Wt 208.7 lb

## 2016-12-21 DIAGNOSIS — R310 Gross hematuria: Secondary | ICD-10-CM

## 2016-12-21 LAB — MICROSCOPIC EXAMINATION
Epithelial Cells (non renal): NONE SEEN /hpf (ref 0–10)
RBC, UA: >30 /hpf — ABNORMAL HIGH (ref 0–?)

## 2016-12-21 LAB — URINALYSIS, COMPLETE
Bilirubin, UA: NEGATIVE
GLUCOSE, UA: NEGATIVE
Ketones, UA: NEGATIVE
Nitrite, UA: NEGATIVE
Specific Gravity, UA: <1.005 — ABNORMAL LOW (ref 1.005–1.030)
Urobilinogen, Ur: 0.2 mg/dL (ref 0.2–1.0)
pH, UA: 5 (ref 5.0–7.5)

## 2016-12-21 NOTE — Telephone Encounter (Signed)
Patient called and left message on the voicemail asking for a return call. Did not say what he wanted. I called back and had to leave a message for him to call back.   Sharyn Lull

## 2016-12-21 NOTE — Progress Notes (Addendum)
Pt presented today with c/o gross hematuria, urinary frequency, and slight dysuria. A clean catch specimen was obtained for u/a and cx.   Blood pressure (!) 174/106, pulse 62, height 5\' 7"  (1.702 m), weight 208 lb 11.2 oz (94.7 kg).  Per Larene Beach no abx will be given until ucx results are back.

## 2016-12-21 NOTE — Telephone Encounter (Signed)
Pt returned nurse call. Pt stated that he is having gross hematuria and the only other time this has happened he had a UTI. Pt stated he has a slight burning sensation with urination. Pt denied n/v, f/c. Pt was added to nurse schedule for a urine check for today.

## 2016-12-21 NOTE — Telephone Encounter (Signed)
LMOM on nurse line about gross hematuria. Attempted to return pt call and had to leave a message.

## 2016-12-25 ENCOUNTER — Telehealth: Payer: Self-pay

## 2016-12-25 DIAGNOSIS — N390 Urinary tract infection, site not specified: Secondary | ICD-10-CM

## 2016-12-25 LAB — CULTURE, URINE COMPREHENSIVE

## 2016-12-25 MED ORDER — CIPROFLOXACIN HCL 250 MG PO TABS: 250.0000 mg | ORAL_TABLET | Freq: Two times a day (BID) | ORAL | 0 refills | Status: AC

## 2016-12-25 NOTE — Telephone Encounter (Signed)
-----   Message from Nori Riis, PA-C sent at 12/25/2016  2:29 PM EDT ----- Please let Mr. Demario know that his urine culture is positive.  He needs to start Cipro 250 mg, one bid for seven days.

## 2016-12-30 DIAGNOSIS — Z87891 Personal history of nicotine dependence: Secondary | ICD-10-CM | POA: Diagnosis not present

## 2016-12-30 DIAGNOSIS — E559 Vitamin D deficiency, unspecified: Secondary | ICD-10-CM | POA: Diagnosis not present

## 2016-12-30 DIAGNOSIS — Z923 Personal history of irradiation: Secondary | ICD-10-CM | POA: Diagnosis not present

## 2016-12-30 DIAGNOSIS — C61 Malignant neoplasm of prostate: Secondary | ICD-10-CM | POA: Diagnosis not present

## 2017-01-25 DIAGNOSIS — H34831 Tributary (branch) retinal vein occlusion, right eye, with macular edema: Secondary | ICD-10-CM | POA: Diagnosis not present

## 2017-01-25 DIAGNOSIS — H35342 Macular cyst, hole, or pseudohole, left eye: Secondary | ICD-10-CM | POA: Diagnosis not present

## 2017-02-18 DIAGNOSIS — E559 Vitamin D deficiency, unspecified: Secondary | ICD-10-CM | POA: Diagnosis not present

## 2017-02-18 DIAGNOSIS — C61 Malignant neoplasm of prostate: Secondary | ICD-10-CM | POA: Diagnosis not present

## 2017-03-01 ENCOUNTER — Encounter: Payer: Self-pay | Admitting: Family Medicine

## 2017-03-01 ENCOUNTER — Ambulatory Visit (INDEPENDENT_AMBULATORY_CARE_PROVIDER_SITE_OTHER): Payer: Medicare Other | Admitting: Family Medicine

## 2017-03-01 ENCOUNTER — Other Ambulatory Visit
Admission: RE | Admit: 2017-03-01 | Discharge: 2017-03-01 | Disposition: A | Discharge status: Home / Self Care | Payer: Medicare Other | Source: Ambulatory Visit | Attending: Family Medicine | Admitting: Family Medicine

## 2017-03-01 VITALS — BP 138/88 | HR 58 | Resp 16 | Ht 67.0 in | Wt 214.0 lb

## 2017-03-01 DIAGNOSIS — E785 Hyperlipidemia, unspecified: Secondary | ICD-10-CM

## 2017-03-01 DIAGNOSIS — N200 Calculus of kidney: Secondary | ICD-10-CM

## 2017-03-01 DIAGNOSIS — E559 Vitamin D deficiency, unspecified: Secondary | ICD-10-CM

## 2017-03-01 DIAGNOSIS — E538 Deficiency of other specified B group vitamins: Secondary | ICD-10-CM | POA: Insufficient documentation

## 2017-03-01 DIAGNOSIS — C61 Malignant neoplasm of prostate: Secondary | ICD-10-CM

## 2017-03-01 DIAGNOSIS — Z8719 Personal history of other diseases of the digestive system: Secondary | ICD-10-CM | POA: Diagnosis not present

## 2017-03-01 DIAGNOSIS — E1140 Type 2 diabetes mellitus with diabetic neuropathy, unspecified: Secondary | ICD-10-CM

## 2017-03-01 DIAGNOSIS — Z23 Encounter for immunization: Secondary | ICD-10-CM

## 2017-03-01 DIAGNOSIS — G629 Polyneuropathy, unspecified: Secondary | ICD-10-CM | POA: Insufficient documentation

## 2017-03-01 LAB — COMPREHENSIVE METABOLIC PANEL
ALBUMIN: 4.6 g/dL (ref 3.5–5.0)
ALK PHOS: 87 U/L (ref 38–126)
ALT: 18 U/L (ref 17–63)
ANION GAP: 9 (ref 5–15)
AST: 26 U/L (ref 15–41)
BILIRUBIN TOTAL: 0.8 mg/dL (ref 0.3–1.2)
BUN: 15 mg/dL (ref 6–20)
CALCIUM: 9.2 mg/dL (ref 8.9–10.3)
CO2: 26 mmol/L (ref 22–32)
Chloride: 101 mmol/L (ref 101–111)
Creatinine, Ser: 1.13 mg/dL (ref 0.61–1.24)
GFR calc Af Amer: >60 mL/min (ref >60)
GFR calc non Af Amer: >60 mL/min (ref >60)
GLUCOSE: 96 mg/dL (ref 65–99)
Potassium: 3.8 mmol/L (ref 3.5–5.1)
SODIUM: 136 mmol/L (ref 135–145)
TOTAL PROTEIN: 8.3 g/dL — AB (ref 6.5–8.1)

## 2017-03-01 LAB — LIPID PANEL
CHOLESTEROL: 146 mg/dL (ref 0–200)
HDL: 57 mg/dL (ref >40)
LDL CALC: 68 mg/dL (ref 0–99)
Total CHOL/HDL Ratio: 2.6 RATIO
Triglycerides: 107 mg/dL (ref <150)
VLDL: 21 mg/dL (ref 0–40)

## 2017-03-01 LAB — CBC
HEMATOCRIT: 43.5 % (ref 40.0–52.0)
HEMOGLOBIN: 15 g/dL (ref 13.0–18.0)
MCH: 31.5 pg (ref 26.0–34.0)
MCHC: 34.4 g/dL (ref 32.0–36.0)
MCV: 91.6 fL (ref 80.0–100.0)
Platelets: 186 10*3/uL (ref 150–440)
RBC: 4.75 MIL/uL (ref 4.40–5.90)
RDW: 13.5 % (ref 11.5–14.5)
WBC: 5.4 10*3/uL (ref 3.8–10.6)

## 2017-03-01 LAB — VITAMIN B12: VITAMIN B 12: 3060 pg/mL — AB (ref 180–914)

## 2017-03-01 LAB — HEMOGLOBIN A1C
HEMOGLOBIN A1C: 5.3 % (ref 4.8–5.6)
MEAN PLASMA GLUCOSE: 105.41 mg/dL

## 2017-03-01 LAB — TSH: TSH: 1.467 u[IU]/mL (ref 0.350–4.500)

## 2017-03-01 NOTE — Progress Notes (Signed)
estab

## 2017-03-01 NOTE — Progress Notes (Signed)
Date:  03/01/2017   Name:  Juan [name]   DOB:  05/24/1950   MRN:  [id number]  PCP:  Adline Potter, MD    Chief Complaint: Establish Care   History of Present Illness:  This is a 66 y.o. male seen for initial visit. T2DM well controlled on diet s/p weight loss, hx neuropathy in feet, no current pain. Prostate ca s/p brachytherapy on Lupron/Avaodart/Casodex followed by oncology. Hx duodenal ulcer s/p vagotomy/bypass, on long tern PPI, no current sxs except intermittent diarrhea. HLD on Lipitor x 2 yrs, dose lowered as caused sugars to go up. Vit D/B12 defs on supplements. Hx lung dz with hypoxemia in past resolved with weight loss. Occ OA pain in hips/knees. Hx ca oxalate kidney stones with L kidney atrophy, followed by urology. Weight decreasing slowly, exercises daily. Hx cataracts, retinal tear followed by optho. Wears hearing aids. Father died 11 CHF/DM/Alz, mother died 66 lung ca, brother s/p THR. Tdap 2105, Pneumovax 2016, zoster 2010. Colonoscopy 2010 normal.  Review of Systems:  Review of Systems  Constitutional: Negative for chills and fever.  HENT: Negative for ear pain, sore throat and trouble swallowing.   Eyes: Negative for pain.  Respiratory: Negative for cough and shortness of breath.   Cardiovascular: Negative for chest pain and leg swelling.  Gastrointestinal: Negative for abdominal pain.  Endocrine: Negative for polydipsia and polyuria.  Genitourinary: Negative for difficulty urinating.  Musculoskeletal: Negative for joint swelling and myalgias.  Neurological: Negative for syncope and light-headedness.  Hematological: Negative for adenopathy.    Patient Active Problem List   Diagnosis Date Noted  . B12 deficiency 03/01/2017  . Prostate cancer ([place]) 08/28/2016  . Obesity (BMI 30-39.9) 08/28/2016  . Kidney stones, calcium oxalate 08/28/2016  . ED (erectile dysfunction) 08/28/2016  . History of duodenal ulcer 08/28/2016  . Type 2 diabetes, controlled, with  neuropathy ([place]) 08/28/2016  . History of cholelithiasis 08/28/2016  . Arthritis 08/28/2016  . Hyperlipidemia, unspecified 04/04/2015  . Vitamin D deficiency, unspecified 07/12/2013  . Atrophic kidney 01/13/2013  . Status post partial gastrectomy 10/11/2012  . H/O vagotomy 10/11/2012    Prior to Admission medications   Medication Sig Start Date End Date Taking? Authorizing Provider  bicalutamide (CASODEX) 50 MG tablet  07/30/16  Yes [provider]  Cholecalciferol (GNP VITAMIN D MAXIMUM STRENGTH) 2000 units TABS Take 1 capsule by mouth 2 (two) times daily.   Yes [provider]  dutasteride (AVODART) 0.5 MG capsule Take 0.5 mg by mouth daily.   Yes [provider]  leuprolide (LUPRON) 3.75 MG injection Inject 3.75 mg into the muscle once.   Yes [provider]  pantoprazole (PROTONIX) 40 MG tablet Take 40 mg by mouth daily.   Yes [provider]  vitamin B-12 (CYANOCOBALAMIN) 1000 MCG tablet Take 1,000 mcg by mouth daily.   Yes [provider]    Allergies  Allergen Reactions  . Nsaids Other (See Comments)    Hx of PUD  . Statins Rash    Hyperglycemia    Past Surgical History:  Procedure Laterality Date  . APPENDECTOMY    . CHOLECYSTECTOMY    . GASTRECTOMY    . INSERTION PROSTATE RADIATION SEED  2002   Brachytherapy  . nasal tumor      Social History  Substance Use Topics  . Smoking status: Former Research scientist (life sciences)  . Smokeless tobacco: Never Used  . Alcohol use Yes    Family History  Problem Relation Age of Onset  . Cancer Mother   .  Diabetes Father   . Prostate cancer Neg Hx   . Kidney cancer Neg Hx     Medication list has been reviewed and updated.  Physical Examination: BP 138/88   Pulse (!) 58   Resp 16   Ht 5\' 7"  (1.702 m)   Wt 214 lb (97.1 kg)   SpO2 96%   BMI 33.52 kg/m   Physical Exam  Constitutional: He is oriented to person, place, and time. He appears well-developed and well-nourished.  HENT:   Head: Normocephalic and atraumatic.  Right Ear: External ear normal.  Left Ear: External ear normal.  Nose: Nose normal.  Mouth/Throat: Oropharynx is clear and moist.  TMs clear  Eyes: Pupils are equal, round, and reactive to light. Conjunctivae and EOM are normal. No scleral icterus.  Neck: Neck supple. No thyromegaly present.  Cardiovascular: Normal rate, regular rhythm, normal heart sounds and intact distal pulses.   Pulmonary/Chest: Effort normal and breath sounds normal.  Abdominal: Soft. He exhibits no distension and no mass. There is no tenderness.  Musculoskeletal: He exhibits no edema.  Lymphadenopathy:    He has no cervical adenopathy.  Neurological: He is alert and oriented to person, place, and time. Coordination normal.  Romberg sl unsteady, gait normal  Skin: Skin is warm and dry.  Psychiatric: He has a normal mood and affect. His behavior is normal.  Nursing note and vitals reviewed.   Assessment and Plan:  1. Type 2 diabetes mellitus with diabetic neuropathy, without long-term current use of insulin (HCC) Well controlled off meds with weight loss, check feet daily - Comprehensive Metabolic Panel (CMET) - CBC - Lipid Profile - TSH - HgB A1c - Urine Microalbumin w/creat. ratio  2. Prostate cancer ([place]) Stable on Lupron/Avaodart/Casodex, followed by oncology  3. Kidney stones, calcium oxalate With L kidney atrophy, maintain hydration, followed by urology  4. Hyperlipidemia, unspecified hyperlipidemia type On Lipitor, discussed risks/benefits, will d/c  5. B12 deficiency On supplement - B12  6. Vitamin D deficiency, unspecified On supplement - Vitamin D (25 hydroxy)  7. History of duodenal ulcer Cont long term PPI  8. Need for immunization against influenza - Flu vaccine HIGH DOSE PF (Fluzone High dose)  9. Need for pneumococcal vaccination - Pneumococcal conjugate vaccine 13-valent  Return in about 4 weeks (around 03/29/2017).  45 mins spent  with pt over half in counseling  Satira Anis. [facility]  03/01/2017

## 2017-03-01 NOTE — Patient Instructions (Signed)

## 2017-03-02 ENCOUNTER — Other Ambulatory Visit: Payer: Self-pay | Admitting: Family Medicine

## 2017-03-02 LAB — MICROALBUMIN / CREATININE URINE RATIO
Creatinine, Urine: 76.7 mg/dL
MICROALB UR: 47.3 ug/mL — AB
MICROALB/CREAT RATIO: 61.7 mg/g{creat} — AB (ref 0.0–30.0)

## 2017-03-02 LAB — VITAMIN D 25 HYDROXY (VIT D DEFICIENCY, FRACTURES): Vit D, 25-Hydroxy: 50.6 ng/mL (ref 30.0–100.0)

## 2017-03-02 MED ORDER — B-12 500 MCG PO TABS: 1.0000 | ORAL_TABLET | Freq: Every day | ORAL | Status: AC

## 2017-03-02 MED ORDER — LISINOPRIL 5 MG PO TABS: 5.0000 mg | ORAL_TABLET | Freq: Every day | ORAL | 2 refills | Status: DC

## 2017-03-02 MED ORDER — CHOLECALCIFEROL 50 MCG (2000 UT) PO TABS: 2000.0000 [IU] | ORAL_TABLET | Freq: Every day | ORAL | Status: AC

## 2017-03-19 ENCOUNTER — Other Ambulatory Visit: Payer: Self-pay

## 2017-03-19 ENCOUNTER — Other Ambulatory Visit
Admission: RE | Admit: 2017-03-19 | Discharge: 2017-03-19 | Disposition: A | Discharge status: Home / Self Care | Payer: Medicare Other | Source: Ambulatory Visit | Attending: Family Medicine | Admitting: Family Medicine

## 2017-03-19 ENCOUNTER — Encounter: Payer: Self-pay | Admitting: Urology

## 2017-03-19 ENCOUNTER — Ambulatory Visit (INDEPENDENT_AMBULATORY_CARE_PROVIDER_SITE_OTHER): Payer: Medicare Other | Admitting: Urology

## 2017-03-19 VITALS — BP 147/79 | HR 66 | Ht 68.0 in | Wt 200.0 lb

## 2017-03-19 DIAGNOSIS — R3129 Other microscopic hematuria: Secondary | ICD-10-CM | POA: Insufficient documentation

## 2017-03-19 DIAGNOSIS — Z87442 Personal history of urinary calculi: Secondary | ICD-10-CM

## 2017-03-19 DIAGNOSIS — N261 Atrophy of kidney (terminal): Secondary | ICD-10-CM

## 2017-03-19 DIAGNOSIS — N390 Urinary tract infection, site not specified: Secondary | ICD-10-CM

## 2017-03-19 DIAGNOSIS — R339 Retention of urine, unspecified: Secondary | ICD-10-CM

## 2017-03-19 DIAGNOSIS — C61 Malignant neoplasm of prostate: Secondary | ICD-10-CM | POA: Diagnosis not present

## 2017-03-19 LAB — URINALYSIS, COMPLETE (UACMP) WITH MICROSCOPIC
BACTERIA UA: NONE SEEN
Bilirubin Urine: NEGATIVE
Glucose, UA: NEGATIVE mg/dL
Ketones, ur: NEGATIVE mg/dL
Nitrite: NEGATIVE
PROTEIN: NEGATIVE mg/dL
RBC / HPF: NONE SEEN RBC/hpf (ref 0–5)
Squamous Epithelial / LPF: NONE SEEN
pH: 5.5 (ref 5.0–8.0)

## 2017-03-19 LAB — BLADDER SCAN AMB NON-IMAGING

## 2017-03-19 NOTE — Progress Notes (Signed)
03/19/2017 2:47 PM   Juan [name] 01-03-1951 [id number]   Referring provider: Donzetta Kohut, DO [address] [place], [place] [address]  Chief Complaint  Patient presents with  . Hematuria    56month    HPI: 66 year old male here for routine follow up for multiple GU issues.    Atrophic left kidney He has a personal history of a nonfunctioning left kidney, possibly secondary to an embolic event. He also has a history of left ureteral stones and stricture without return of function following stricture dilations and stent placement. Based on a Lasix renogram from 9/14, left kidney provides only approximate 5% of overall function. He has a known 5 mm right lower pole stone.   Prostate cancer He also has a personal history of castrate resistant prostate cancer. He underwent primary brachytherapy in 2003 and has been managed since with intermittent ADT and is followed by Dr. Alvester Chou.  He is currently on Casodex, Avodart, and Lupron.     Gross hematuria/ UTI Status post gross hematuria workup 09/2016 including CT urogram as well as cystoscopy.    He did have recurrent episode of gross hematuria since last visit in 12/2016.  This was associated with dysuria.  Urine culture ultimately grew enterococcus that he was treated with Cipro for UTI.  PVR 234 cc  IPSS as below  CKD He does have a personal history of CKD stage III. His most recent creatinine was on 1.13 10/18.  Kidney stones Incidental small renal calculi bilaterally on CT urogram 09/2016.  Denies flank pain.   IPSS    Row Name 03/19/17 1400         International Prostate Symptom Score   How often have you had the sensation of not emptying your bladder?  Not at All     How often have you had to urinate less than every two hours?  Less than half the time     How often have you found you stopped and started again several times when you urinated?  More than half the time     How often have you found it  difficult to postpone urination?  About half the time     How often have you had a weak urinary stream?  Not at All     How often have you had to strain to start urination?  Not at All     How many times did you typically get up at night to urinate?  1 Time     Total IPSS Score  10       Quality of Life due to urinary symptoms   If you were to spend the rest of your life with your urinary condition just the way it is now how would you feel about that?  Pleased        Score:  1-7 Mild 8-19 Moderate 20-35 Severe   PMH: Past Medical History:  Diagnosis Date  . Arthritis 08/28/2016  . Atrophic kidney 01/13/2013   Last Assessment & Plan:  Atrophic and nonfunctional LEFT kidney.  Discussed at length today. Asymptomatic. No intervention needed.  Overview:  Last Assessment & Plan:  Atrophic and nonfunctional LEFT kidney.  Discussed at length today. Asymptomatic. No intervention needed. Last Assessment & Plan:  Atrophic and nonfunctional LEFT kidney.  Discussed at length today. Asymptomatic. No intervention needed.  . Cancer ([place])   . COPD (chronic obstructive pulmonary disease) ([place]) 08/28/2016  . Diabetes mellitus type 2, uncomplicated ([place]) 7/61/6073  .  ED (erectile dysfunction) 08/28/2016  . GERD (gastroesophageal reflux disease)   . Gross hematuria 07/22/2016   Overview:  Last Assessment & Plan:  The differential diagnosis for hematuria was reviewed.  Possible etiologies include, but are not limited to urolithiasis, infection, urothelial or renal malignancies, medical renal disease, and benign idiopathic hematuria.  The workup was reviewed and he will need a urinary cytology testing, a CT urogram, and Cystourethroscopy.  He has moved to [place], [place] and would like to have all this done with a local urologist since it is an hour and a half drive. Will send records there and have the work performed locally.   > 15 minutes were spent in face to face contact with the patient, >50% of which was  spent counseling about about the diagnosis and plan.  Last Assessment & Plan:  The differential diagnosis for hematuria was reviewed.  Possible etiologies include, but are not limited to urolithiasis, infection, urothelial or renal malignancies, medical renal disease, and benign idiopathic hematuria.  The workup was reviewed and he will need a urinary cytology testing,   . H/O vagotomy 10/11/2012  . Hyperlipemia   . Hypertension 08/28/2016  . Kidney stones 08/28/2016   Last Assessment & Plan:  Can't see stone on KUB today again  Pt instructed to call or go to ER for acute flank pain since he only has one functioning kidney.  Currently no evidence of any obstructive uropathy.  Check chem 8 and call with results.  F/u one year for KUB  . Knee contusion 10/20/2013  . Peptic ulcer 08/28/2016  . Prostate cancer ([place])   . Sarcoidosis 04/04/2015  . Status post partial gastrectomy 10/11/2012    Surgical History: Past Surgical History:  Procedure Laterality Date  . APPENDECTOMY    . CHOLECYSTECTOMY    . GASTRECTOMY    . INSERTION PROSTATE RADIATION SEED  2002   Brachytherapy  . nasal tumor      Home Medications:  Allergies as of 03/19/2017      Reactions   Nsaids Other (See Comments)   Hx of PUD   Statins Rash   Hyperglycemia      Medication List        Accurate as of 03/19/17  2:47 PM. Always use your most recent med list.          B-12 500 MCG Tabs Take 1 tablet by mouth daily.   bicalutamide 50 MG tablet Commonly known as:  CASODEX   Cholecalciferol 2000 units Tabs Commonly known as:  GNP VITAMIN D MAXIMUM STRENGTH Take 1 tablet (2,000 Units total) by mouth daily.   dutasteride 0.5 MG capsule Commonly known as:  AVODART Take 0.5 mg by mouth daily.   lisinopril 5 MG tablet Commonly known as:  PRINIVIL,ZESTRIL Take 1 tablet (5 mg total) by mouth daily.   LUPRON DEPOT (89-MONTH) 22.5 MG injection Generic drug:  leuprolide Inject into the muscle.   MULTI-VITAMINS  Tabs Take by mouth.   pantoprazole 40 MG tablet Commonly known as:  PROTONIX Take 40 mg by mouth daily.       Allergies:  Allergies  Allergen Reactions  . Nsaids Other (See Comments)    Hx of PUD  . Statins Rash    Hyperglycemia    Family History: Family History  Problem Relation Age of Onset  . Cancer Mother   . Diabetes Father   . Prostate cancer Neg Hx   . Kidney cancer Neg Hx     Social History:  reports that he has quit smoking. he has never used smokeless tobacco. He reports that he drinks alcohol. He reports that he does not use drugs.  ROS: UROLOGY Frequent Urination?: No Hard to postpone urination?: No Burning/pain with urination?: No Get up at night to urinate?: Yes Leakage of urine?: Yes Urine stream starts and stops?: No Trouble starting stream?: No Do you have to strain to urinate?: No Blood in urine?: No Urinary tract infection?: No Sexually transmitted disease?: No Injury to kidneys or bladder?: No Painful intercourse?: No Weak stream?: No Erection problems?: No Penile pain?: No  Gastrointestinal Nausea?: No Vomiting?: No Indigestion/heartburn?: No Diarrhea?: No Constipation?: No  Constitutional Fever: No Night sweats?: No Weight loss?: No Fatigue?: No  Skin Skin rash/lesions?: No Itching?: No  Eyes Blurred vision?: No Double vision?: No  Ears/Nose/Throat Sore throat?: No Sinus problems?: No  Hematologic/Lymphatic Swollen glands?: No Easy bruising?: No  Cardiovascular Leg swelling?: No Chest pain?: No  Respiratory Cough?: No Shortness of breath?: No  Endocrine Excessive thirst?: No  Musculoskeletal Back pain?: No Joint pain?: No  Neurological Headaches?: No Dizziness?: No  Psychologic Depression?: No Anxiety?: No  Physical Exam: BP (!) 147/79   Pulse 66   Ht 5\' 8"  (1.727 m)   Wt 200 lb (90.7 kg)   BMI 30.41 kg/m   Constitutional:  Alert and oriented, No acute distress. HEENT: [place] AT, moist mucus  membranes.  Trachea midline, no masses. Cardiovascular: No clubbing, cyanosis, or edema. Respiratory: Normal respiratory effort, no increased work of breathing. GI: Abdomen is soft, nontender, nondistended, no abdominal masses GU: No CVA tenderness.  Skin: No rashes, bruises or suspicious lesions. Neurologic: Grossly intact, no focal deficits, moving all 4 extremities. Psychiatric: Normal mood and affect.  Laboratory Data: Lab Results  Component Value Date   CREATININE 1.13 03/01/2017   Urinalysis Results for orders placed or performed in visit on 03/19/17  BLADDER SCAN AMB NON-IMAGING  Result Value Ref Range   Scan Result 244ml     Pertinent Imaging: n/a  Assessment & Plan:    1. Recurrent UTI/ incomplete bladder emptying Currently asymptomatic  2 UTIs over the past 12 Elevated postvoid residual today concerning for incomplete bladder emptying which may be an underlying cause of his recurrent infections We will have him follow closely, repeat PVR in 3 months Advised to double void If he continues to have incomplete bladder emptying, may consider initiation of clean intermittent catheterization versus channel TURP in the future  2. Prostate cancer ([place]) Castrate resistant prostate cancer managed on intermittent ADT Followed closely by Dr. Gwenlyn Found at [facility].    3. History of nephrolithiasis Asymptomatic bilateral nonobstructing stones, left greater than right Nidus for?  UTI Would like to continue to follow  4. Left renal atrophy Per report, less than 5% function despite ureteral stenting Continue conservative management    Return in about 3 months (around 06/19/2017) for PVR.  Hollice Espy, MD  [facility] [address] [place], [place] [address] [phone]

## 2017-03-22 ENCOUNTER — Telehealth: Payer: Self-pay

## 2017-03-22 NOTE — Telephone Encounter (Signed)
Pt left a message on triage line that Sunday morning he noticed bright red blood in urine but as the day went on the blood became more pink. Pt then stated that today he has not seen any blood. Pt stated on message that he did not need a call back but did want to make Dr. Erlene Quan aware.

## 2017-03-29 ENCOUNTER — Other Ambulatory Visit: Payer: Self-pay | Admitting: Family Medicine

## 2017-03-29 MED ORDER — LISINOPRIL 5 MG PO TABS: 5.0000 mg | ORAL_TABLET | Freq: Every day | ORAL | 3 refills | Status: DC

## 2017-03-31 ENCOUNTER — Encounter: Payer: Self-pay | Admitting: Family Medicine

## 2017-03-31 ENCOUNTER — Ambulatory Visit (INDEPENDENT_AMBULATORY_CARE_PROVIDER_SITE_OTHER): Payer: Medicare Other | Admitting: Family Medicine

## 2017-03-31 VITALS — BP 118/76 | HR 58 | Resp 16 | Ht 68.0 in | Wt 204.0 lb

## 2017-03-31 DIAGNOSIS — E559 Vitamin D deficiency, unspecified: Secondary | ICD-10-CM | POA: Diagnosis not present

## 2017-03-31 DIAGNOSIS — R809 Proteinuria, unspecified: Secondary | ICD-10-CM | POA: Diagnosis not present

## 2017-03-31 DIAGNOSIS — C61 Malignant neoplasm of prostate: Secondary | ICD-10-CM | POA: Diagnosis not present

## 2017-03-31 DIAGNOSIS — E538 Deficiency of other specified B group vitamins: Secondary | ICD-10-CM | POA: Diagnosis not present

## 2017-03-31 DIAGNOSIS — E1140 Type 2 diabetes mellitus with diabetic neuropathy, unspecified: Secondary | ICD-10-CM | POA: Diagnosis not present

## 2017-03-31 NOTE — Progress Notes (Signed)
Date:  03/31/2017   Name:  Juan [name]   DOB:  [birth year]   MRN:  [id number]  PCP:  Adline Potter, MD    Chief Complaint: Diabetes (f/u )   History of Present Illness:  This is a 66 y.o. male seen for one month f/u from initial visit. Lisinopril started for increased MCR, tolerating well. B12 and vit D doses decreased, off Lipitor. Neuropathy stable. Urology monitoring prostate ca/urinary retention/recurrent UTI.  Review of Systems:  Review of Systems  Constitutional: Negative for chills and fever.  Respiratory: Negative for cough and shortness of breath.   Cardiovascular: Negative for chest pain and leg swelling.  Genitourinary: Negative for difficulty urinating.  Neurological: Negative for syncope and light-headedness.    Patient Active Problem List   Diagnosis Date Noted  . Microalbuminuria 03/31/2017  . B12 deficiency 03/01/2017  . Prostate cancer ([place]) 08/28/2016  . Obesity (BMI 30-39.9) 08/28/2016  . Kidney stones, calcium oxalate 08/28/2016  . ED (erectile dysfunction) 08/28/2016  . History of duodenal ulcer 08/28/2016  . Type 2 diabetes, controlled, with neuropathy ([place]) 08/28/2016  . History of cholelithiasis 08/28/2016  . Arthritis 08/28/2016  . Hyperlipidemia, unspecified 04/04/2015  . Vitamin D deficiency, unspecified 07/12/2013  . Atrophic kidney 01/13/2013  . Status post partial gastrectomy 10/11/2012  . H/O vagotomy 10/11/2012    Prior to Admission medications   Medication Sig Start Date End Date Taking? Authorizing Provider  bicalutamide (CASODEX) 50 MG tablet  07/30/16  Yes [provider]  Cholecalciferol (GNP VITAMIN D MAXIMUM STRENGTH) 2000 units TABS Take 1 tablet (2,000 Units total) by mouth daily. 03/02/17  Yes Kaeya Schiffer, Gwyndolyn Saxon, MD  Cyanocobalamin (B-12) 500 MCG TABS Take 1 tablet by mouth daily. 03/02/17  Yes Seara Hinesley, Gwyndolyn Saxon, MD  dutasteride (AVODART) 0.5 MG capsule Take 0.5 mg by mouth daily.   Yes [provider]  leuprolide  (LUPRON DEPOT, 108-MONTH,) 22.5 MG injection Inject into the muscle.   Yes [provider]  lisinopril (PRINIVIL,ZESTRIL) 5 MG tablet Take 1 tablet (5 mg total) by mouth daily. 03/29/17  Yes Raesha Coonrod, Gwyndolyn Saxon, MD  Multiple Vitamin (MULTI-VITAMINS) TABS Take by mouth.   Yes [provider]  pantoprazole (PROTONIX) 40 MG tablet Take 40 mg by mouth daily.   Yes [provider]    Allergies  Allergen Reactions  . Nsaids Other (See Comments)    Hx of PUD  . Statins Rash    Hyperglycemia    Past Surgical History:  Procedure Laterality Date  . APPENDECTOMY    . CHOLECYSTECTOMY    . GASTRECTOMY    . INSERTION PROSTATE RADIATION SEED  2002   Brachytherapy  . nasal tumor      Social History   Tobacco Use  . Smoking status: Former Research scientist (life sciences)  . Smokeless tobacco: Never Used  Substance Use Topics  . Alcohol use: Yes  . Drug use: No    Family History  Problem Relation Age of Onset  . Cancer Mother   . Diabetes Father   . Prostate cancer Neg Hx   . Kidney cancer Neg Hx     Medication list has been reviewed and updated.  Physical Examination: BP 118/76   Pulse (!) 58   Resp 16   Ht 5\' 8"  (1.727 m)   Wt 204 lb (92.5 kg)   SpO2 97%   BMI 31.02 kg/m   Physical Exam  Constitutional: He appears well-developed and well-nourished.  Cardiovascular: Normal rate, regular rhythm and normal heart sounds.  Pulmonary/Chest: Effort  normal and breath sounds normal.  Musculoskeletal: He exhibits no edema.  Neurological: He is alert.  Skin: Skin is warm and dry.  Psychiatric: He has a normal mood and affect. His behavior is normal.  Nursing note and vitals reviewed.   Assessment and Plan:  1. Type 2 diabetes, controlled, with neuropathy ([place]) Stable, on lisinopril for elevated MCR - Basic Metabolic Panel (BMET)  2. Prostate cancer [facility]) Followed by urology  3. Microalbuminuria - Urine Microalbumin w/creat. ratio  4. B12 deficiency On decreased  supplement - B12  5. Vitamin D deficiency, unspecified On decreased supplement, consider level next visit  Return in about 3 months (around 07/01/2017).  Satira Anis. [place] Burd, [facility]  03/31/2017

## 2017-04-01 DIAGNOSIS — C61 Malignant neoplasm of prostate: Secondary | ICD-10-CM | POA: Diagnosis not present

## 2017-04-01 DIAGNOSIS — E559 Vitamin D deficiency, unspecified: Secondary | ICD-10-CM | POA: Diagnosis not present

## 2017-04-01 LAB — BASIC METABOLIC PANEL
BUN / CREAT RATIO: 20 (ref 10–24)
BUN: 27 mg/dL (ref 8–27)
CO2: 38 mmol/L — ABNORMAL HIGH (ref 20–29)
CREATININE: 1.35 mg/dL — AB (ref 0.76–1.27)
Calcium: 9.7 mg/dL (ref 8.6–10.2)
Chloride: 99 mmol/L (ref 96–106)
GFR, EST AFRICAN AMERICAN: 62 mL/min/{1.73_m2} (ref >59)
GFR, EST NON AFRICAN AMERICAN: 54 mL/min/{1.73_m2} — AB (ref >59)
GLUCOSE: 73 mg/dL (ref 65–99)
Potassium: 4.5 mmol/L (ref 3.5–5.2)
SODIUM: 138 mmol/L (ref 134–144)

## 2017-04-01 LAB — MICROALBUMIN / CREATININE URINE RATIO
CREATININE, UR: 39.7 mg/dL
Microalb/Creat Ratio: 19.9 mg/g creat (ref 0.0–30.0)
Microalbumin, Urine: 7.9 ug/mL

## 2017-04-01 LAB — VITAMIN B12: Vitamin B-12: 1640 pg/mL — ABNORMAL HIGH (ref 232–1245)

## 2017-04-05 DIAGNOSIS — H34831 Tributary (branch) retinal vein occlusion, right eye, with macular edema: Secondary | ICD-10-CM | POA: Diagnosis not present

## 2017-05-13 DIAGNOSIS — C61 Malignant neoplasm of prostate: Secondary | ICD-10-CM | POA: Diagnosis not present

## 2017-05-13 DIAGNOSIS — E559 Vitamin D deficiency, unspecified: Secondary | ICD-10-CM | POA: Diagnosis not present

## 2017-06-17 ENCOUNTER — Telehealth: Payer: Self-pay

## 2017-06-17 NOTE — Telephone Encounter (Signed)
Called pt to sched for AWV w/ [facility]. LVM requesting returned call.

## 2017-06-18 ENCOUNTER — Ambulatory Visit (INDEPENDENT_AMBULATORY_CARE_PROVIDER_SITE_OTHER): Payer: Medicare Other | Admitting: Urology

## 2017-06-18 ENCOUNTER — Encounter: Payer: Self-pay | Admitting: Urology

## 2017-06-18 ENCOUNTER — Other Ambulatory Visit
Admission: RE | Admit: 2017-06-18 | Discharge: 2017-06-18 | Disposition: A | Discharge status: Home / Self Care | Payer: Medicare Other | Source: Ambulatory Visit | Attending: Urology | Admitting: Urology

## 2017-06-18 VITALS — BP 145/86 | HR 57 | Ht 67.0 in | Wt 200.0 lb

## 2017-06-18 DIAGNOSIS — N390 Urinary tract infection, site not specified: Secondary | ICD-10-CM | POA: Diagnosis not present

## 2017-06-18 DIAGNOSIS — N261 Atrophy of kidney (terminal): Secondary | ICD-10-CM | POA: Diagnosis not present

## 2017-06-18 DIAGNOSIS — R339 Retention of urine, unspecified: Secondary | ICD-10-CM | POA: Diagnosis not present

## 2017-06-18 DIAGNOSIS — C61 Malignant neoplasm of prostate: Secondary | ICD-10-CM

## 2017-06-18 LAB — BLADDER SCAN AMB NON-IMAGING

## 2017-06-18 LAB — BASIC METABOLIC PANEL
Anion gap: 9 (ref 5–15)
BUN: 24 mg/dL — ABNORMAL HIGH (ref 6–20)
CHLORIDE: 104 mmol/L (ref 101–111)
CO2: 24 mmol/L (ref 22–32)
CREATININE: 1.11 mg/dL (ref 0.61–1.24)
Calcium: 9.2 mg/dL (ref 8.9–10.3)
GFR calc Af Amer: >60 mL/min (ref >60)
GLUCOSE: 82 mg/dL (ref 65–99)
POTASSIUM: 4.1 mmol/L (ref 3.5–5.1)
Sodium: 137 mmol/L (ref 135–145)

## 2017-06-18 NOTE — Progress Notes (Signed)
06/18/2017 4:18 PM   Juan [name] [birth year] [id number]   Referring provider: Adline Potter, MD [address] [place], [place] [address]  Chief Complaint  Patient presents with  . Benign Prostatic Hypertrophy    44month    HPI: 67 year old male here for routine follow up for multiple GU issues.    Atrophic left kidney He has a personal history of a nonfunctioning left kidney, possibly secondary to an embolic event. He also has a history of left ureteral stones and stricture without return of function following stricture dilations and stent placement. Based on a Lasix renogram from 9/14, left kidney provides only approximate 5% of overall function. He has a known 5 mm right lower pole stone.   Prostate cancer He also has a personal history of castrate resistant prostate cancer. He underwent primary brachytherapy in 2003 and has been managed since with intermittent ADT and is followed by Dr. Alvester Chou.  He is currently on Casodex, Avodart, and Lupron.     Gross hematuria/ UTI Status post gross hematuria workup 09/2016 including CT urogram as well as cystoscopy.    He did have recurrent episode of gross hematuria since last visit in 12/2016.  This was associated with dysuria.  Urine culture ultimately grew enterococcus that he was treated with Cipro for UTI.  CKD He does have a personal history of CKD stage III. His most recent creatinine was on 1.13 10/18.  This was repeated in 03/2017 and noted to have risen to 1.35 in the setting of recently diagnosed incomplete bladder emptying.  Repeat BMP today was obtained.  Kidney stones Incidental small renal calculi bilaterally on CT urogram 09/2016.  Denies flank pain.   Incomplete bladder empting Notably elevated postvoid residual to 243 last visit.  This remains elevated today at 334 cc consistent with chronic incomplete emptying.  He is relatively asymptomatic from this.  IPSS as below.  He does have a personal history of UTIs  as above.    IPSS    Row Name 06/18/17 1500         International Prostate Symptom Score   How often have you had the sensation of not emptying your bladder?  Not at All     How often have you had to urinate less than every two hours?  Less than 1 in 5 times     How often have you found you stopped and started again several times when you urinated?  About half the time     How often have you found it difficult to postpone urination?  Less than half the time     How often have you had a weak urinary stream?  Less than half the time     How often have you had to strain to start urination?  Not at All     How many times did you typically get up at night to urinate?  2 Times     Total IPSS Score  10       Quality of Life due to urinary symptoms   If you were to spend the rest of your life with your urinary condition just the way it is now how would you feel about that?  Mostly Satisfied        Score:  1-7 Mild 8-19 Moderate 20-35 Severe   PMH: Past Medical History:  Diagnosis Date  . Arthritis 08/28/2016  . Atrophic kidney 01/13/2013   Last Assessment & Plan:  Atrophic and nonfunctional LEFT kidney.  Discussed at length today. Asymptomatic. No intervention needed.  Overview:  Last Assessment & Plan:  Atrophic and nonfunctional LEFT kidney.  Discussed at length today. Asymptomatic. No intervention needed. Last Assessment & Plan:  Atrophic and nonfunctional LEFT kidney.  Discussed at length today. Asymptomatic. No intervention needed.  . Cancer ([place])   . COPD (chronic obstructive pulmonary disease) ([place]) 08/28/2016  . Diabetes mellitus type 2, uncomplicated ([place]) 2/45/8099  . ED (erectile dysfunction) 08/28/2016  . GERD (gastroesophageal reflux disease)   . Gross hematuria 07/22/2016   Overview:  Last Assessment & Plan:  The differential diagnosis for hematuria was reviewed.  Possible etiologies include, but are not limited to urolithiasis, infection, urothelial or renal malignancies,  medical renal disease, and benign idiopathic hematuria.  The workup was reviewed and he will need a urinary cytology testing, a CT urogram, and Cystourethroscopy.  He has moved to [place], [place] and would like to have all this done with a local urologist since it is an hour and a half drive. Will send records there and have the work performed locally.   > 15 minutes were spent in face to face contact with the patient, >50% of which was spent counseling about about the diagnosis and plan.  Last Assessment & Plan:  The differential diagnosis for hematuria was reviewed.  Possible etiologies include, but are not limited to urolithiasis, infection, urothelial or renal malignancies, medical renal disease, and benign idiopathic hematuria.  The workup was reviewed and he will need a urinary cytology testing,   . H/O vagotomy 10/11/2012  . Hyperlipemia   . Hypertension 08/28/2016  . Kidney stones 08/28/2016   Last Assessment & Plan:  Can't see stone on KUB today again  Pt instructed to call or go to ER for acute flank pain since he only has one functioning kidney.  Currently no evidence of any obstructive uropathy.  Check chem 8 and call with results.  F/u one year for KUB  . Knee contusion 10/20/2013  . Peptic ulcer 08/28/2016  . Prostate cancer ([place])   . Sarcoidosis 04/04/2015  . Status post partial gastrectomy 10/11/2012    Surgical History: Past Surgical History:  Procedure Laterality Date  . APPENDECTOMY    . CHOLECYSTECTOMY    . GASTRECTOMY    . INSERTION PROSTATE RADIATION SEED  2002   Brachytherapy  . nasal tumor      Home Medications:  Allergies as of 06/18/2017      Reactions   Nsaids Other (See Comments)   Hx of PUD   Statins Rash   Hyperglycemia      Medication List        Accurate as of 06/18/17 11:59 PM. Always use your most recent med list.          B-12 500 MCG Tabs Take 1 tablet by mouth daily.   bicalutamide 50 MG tablet Commonly known as:  CASODEX   Cholecalciferol  2000 units Tabs Commonly known as:  GNP VITAMIN D MAXIMUM STRENGTH Take 1 tablet (2,000 Units total) by mouth daily.   dutasteride 0.5 MG capsule Commonly known as:  AVODART Take 0.5 mg by mouth daily.   lisinopril 5 MG tablet Commonly known as:  PRINIVIL,ZESTRIL Take 1 tablet (5 mg total) by mouth daily.   LUPRON DEPOT (36-MONTH) 22.5 MG injection Generic drug:  leuprolide Inject into the muscle.   MULTI-VITAMINS Tabs Take by mouth.   pantoprazole 40 MG tablet Commonly known as:  PROTONIX Take 40 mg by mouth daily.  Allergies:  Allergies  Allergen Reactions  . Nsaids Other (See Comments)    Hx of PUD  . Statins Rash    Hyperglycemia    Family History: Family History  Problem Relation Age of Onset  . Cancer Mother   . Diabetes Father   . Prostate cancer Neg Hx   . Kidney cancer Neg Hx     Social History:  reports that he has quit smoking. he has never used smokeless tobacco. He reports that he drinks alcohol. He reports that he does not use drugs.  ROS: UROLOGY Frequent Urination?: No Hard to postpone urination?: No Burning/pain with urination?: No Get up at night to urinate?: No Leakage of urine?: No Urine stream starts and stops?: No Trouble starting stream?: No Do you have to strain to urinate?: No Blood in urine?: No Urinary tract infection?: No Sexually transmitted disease?: No Injury to kidneys or bladder?: No Painful intercourse?: No Weak stream?: No Erection problems?: No Penile pain?: No  Gastrointestinal Nausea?: No Vomiting?: No Indigestion/heartburn?: No Diarrhea?: No Constipation?: No  Constitutional Fever: No Night sweats?: No Weight loss?: No Fatigue?: No  Skin Skin rash/lesions?: No Itching?: No  Eyes Blurred vision?: No Double vision?: No  Ears/Nose/Throat Sore throat?: No Sinus problems?: No  Hematologic/Lymphatic Swollen glands?: No Easy bruising?: No  Cardiovascular Leg swelling?: No Chest pain?:  No  Respiratory Cough?: Yes Shortness of breath?: No  Endocrine Excessive thirst?: No  Musculoskeletal Back pain?: No Joint pain?: No  Neurological Headaches?: No Dizziness?: No  Psychologic Depression?: No Anxiety?: No  Physical Exam: BP (!) 145/86   Pulse (!) 57   Ht 5\' 7"  (1.702 m)   Wt 200 lb (90.7 kg)   BMI 31.32 kg/m   Constitutional:  Alert and oriented, No acute distress. HEENT: [place] AT, moist mucus membranes.  Trachea midline, no masses. Cardiovascular: No clubbing, cyanosis, or edema. Respiratory: Normal respiratory effort, no increased work of breathing. Skin: No rashes, bruises or suspicious lesions. Neurologic: Grossly intact, no focal deficits, moving all 4 extremities. Psychiatric: Normal mood and affect.  Laboratory Data: Lab Results  Component Value Date   CREATININE 1.11 06/18/2017   Urinalysis Results for orders placed or performed in visit on 06/18/17  BLADDER SCAN AMB NON-IMAGING  Result Value Ref Range   Scan Result 334lb     Pertinent Imaging: n/a  Assessment & Plan:    1. Recurrent UTI/ incomplete bladder emptying Currently asymptomatic  2 UTIs over the past 12 Elevated postvoid residual today concerning for incomplete bladder emptying which may be an underlying cause of his recurrent infections Persistently elevated postvoid residual today as above Recheck creatinine today to ensure that his recent rise is not upward trending May benefit from channel TURP-would prefer to wait until his next PET scan in several months as this may ultimately need to change in therapy which may help his urinary issues Continue double voiding Signs and symptoms of retention were reviewed in detail  2. Prostate cancer ([place]) Castrate resistant prostate cancer managed on intermittent ADT Followed closely by Dr. Gwenlyn [facility] at [facility].    3. History of nephrolithiasis Asymptomatic bilateral nonobstructing stones, left greater than right N[place]us for?  UTI Would  like to continue to follow  4. Left renal atrophy Per report, less than 5% function despite ureteral stenting Continue conservative management    Follow-up in 3 months after next medical oncology appointment  Hollice Espy, MD  [facility] [address] [place], [place] [address] [phone]  I spent 25 min with this patient of which greater than 50% was spent in counseling and coordination of care with the patient.

## 2017-06-21 ENCOUNTER — Telehealth: Payer: Self-pay

## 2017-06-21 NOTE — Telephone Encounter (Signed)
-----   Message from Hollice Espy, MD sent at 06/20/2017  1:30 PM EST ----- Good news.  Creatinine is back to baseline (improved).  Hollice Espy, MD

## 2017-06-21 NOTE — Telephone Encounter (Signed)
Spoke with pt in reference to lab results. Pt voiced understanding.  

## 2017-06-24 DIAGNOSIS — C61 Malignant neoplasm of prostate: Secondary | ICD-10-CM | POA: Diagnosis not present

## 2017-06-24 DIAGNOSIS — R9721 Rising PSA following treatment for malignant neoplasm of prostate: Secondary | ICD-10-CM | POA: Diagnosis not present

## 2017-06-24 DIAGNOSIS — Z79899 Other long term (current) drug therapy: Secondary | ICD-10-CM | POA: Diagnosis not present

## 2017-06-24 DIAGNOSIS — Z801 Family history of malignant neoplasm of trachea, bronchus and lung: Secondary | ICD-10-CM | POA: Diagnosis not present

## 2017-06-24 DIAGNOSIS — E559 Vitamin D deficiency, unspecified: Secondary | ICD-10-CM | POA: Diagnosis not present

## 2017-06-24 DIAGNOSIS — Z87891 Personal history of nicotine dependence: Secondary | ICD-10-CM | POA: Diagnosis not present

## 2017-06-28 DIAGNOSIS — H34831 Tributary (branch) retinal vein occlusion, right eye, with macular edema: Secondary | ICD-10-CM | POA: Diagnosis not present

## 2017-06-28 DIAGNOSIS — H35342 Macular cyst, hole, or pseudohole, left eye: Secondary | ICD-10-CM | POA: Diagnosis not present

## 2017-06-30 ENCOUNTER — Ambulatory Visit (INDEPENDENT_AMBULATORY_CARE_PROVIDER_SITE_OTHER): Payer: Medicare Other | Admitting: Family Medicine

## 2017-06-30 ENCOUNTER — Other Ambulatory Visit
Admission: RE | Admit: 2017-06-30 | Discharge: 2017-06-30 | Disposition: A | Discharge status: Home / Self Care | Payer: Medicare Other | Source: Ambulatory Visit | Attending: Family Medicine | Admitting: Family Medicine

## 2017-06-30 ENCOUNTER — Ambulatory Visit
Admission: RE | Admit: 2017-06-30 | Discharge: 2017-06-30 | Disposition: A | Discharge status: Home / Self Care | Payer: Medicare Other | Source: Ambulatory Visit | Attending: Family Medicine | Admitting: Family Medicine

## 2017-06-30 ENCOUNTER — Encounter: Payer: Self-pay | Admitting: Family Medicine

## 2017-06-30 VITALS — BP 138/80 | HR 56 | Resp 16 | Ht 67.0 in | Wt 195.0 lb

## 2017-06-30 DIAGNOSIS — M5136 Other intervertebral disc degeneration, lumbar region: Secondary | ICD-10-CM | POA: Insufficient documentation

## 2017-06-30 DIAGNOSIS — M160 Bilateral primary osteoarthritis of hip: Secondary | ICD-10-CM | POA: Insufficient documentation

## 2017-06-30 DIAGNOSIS — C61 Malignant neoplasm of prostate: Secondary | ICD-10-CM | POA: Diagnosis not present

## 2017-06-30 DIAGNOSIS — E669 Obesity, unspecified: Secondary | ICD-10-CM | POA: Diagnosis not present

## 2017-06-30 DIAGNOSIS — E559 Vitamin D deficiency, unspecified: Secondary | ICD-10-CM

## 2017-06-30 DIAGNOSIS — E538 Deficiency of other specified B group vitamins: Secondary | ICD-10-CM

## 2017-06-30 DIAGNOSIS — M25551 Pain in right hip: Secondary | ICD-10-CM

## 2017-06-30 DIAGNOSIS — Z0000 Encounter for general adult medical examination without abnormal findings: Secondary | ICD-10-CM | POA: Diagnosis not present

## 2017-06-30 DIAGNOSIS — R809 Proteinuria, unspecified: Secondary | ICD-10-CM | POA: Diagnosis not present

## 2017-06-30 DIAGNOSIS — E1140 Type 2 diabetes mellitus with diabetic neuropathy, unspecified: Secondary | ICD-10-CM

## 2017-06-30 LAB — HEMOGLOBIN A1C
HEMOGLOBIN A1C: 5.5 % (ref 4.8–5.6)
MEAN PLASMA GLUCOSE: 111.15 mg/dL

## 2017-06-30 LAB — VITAMIN B12: Vitamin B-12: 1175 pg/mL — ABNORMAL HIGH (ref 180–914)

## 2017-06-30 NOTE — Progress Notes (Signed)
Date:  06/30/2017   Name:  Juan [name]   DOB:  December 16, 1950   MRN:  [id number]  PCP:  Adline Potter, MD    Chief Complaint: Diabetes and Hip Pain (1 month of consistant pain R Hip interupting sleep. )   History of Present Illness:  This is a 67 y.o. male seen for three month f/u. Feeling well except progressive R hip pain starting to interfere with activity. Neuropathy sxs mild and stable. Prostate ca followed by urology, consider PET scan soon. No current GI sxs on PPI for hx duodenal ulcer. On vit D and B12 supplements.   Review of Systems:  Review of Systems  Constitutional: Negative for chills and fever.  Respiratory: Negative for cough and shortness of breath.   Cardiovascular: Negative for chest pain and leg swelling.  Genitourinary: Negative for difficulty urinating.  Neurological: Negative for syncope and light-headedness.    Patient Active Problem List   Diagnosis Date Noted  . Microalbuminuria 03/31/2017  . B12 deficiency 03/01/2017  . Prostate cancer ([place]) 08/28/2016  . Obesity (BMI 30-39.9) 08/28/2016  . Kidney stones, calcium oxalate 08/28/2016  . ED (erectile dysfunction) 08/28/2016  . History of duodenal ulcer 08/28/2016  . Type 2 diabetes, controlled, with neuropathy ([place]) 08/28/2016  . History of cholelithiasis 08/28/2016  . Arthritis 08/28/2016  . Hyperlipidemia, unspecified 04/04/2015  . Vitamin D deficiency, unspecified 07/12/2013  . Atrophic kidney 01/13/2013  . Status post partial gastrectomy 10/11/2012  . H/O vagotomy 10/11/2012    Prior to Admission medications   Medication Sig Start Date End Date Taking? Authorizing Provider  acetaminophen (TYLENOL) 500 MG tablet Take 1,000 mg by mouth 2 (two) times daily as needed for pain.   Yes [provider]  bicalutamide (CASODEX) 50 MG tablet  07/30/16  Yes [provider]  Cholecalciferol (GNP VITAMIN D MAXIMUM STRENGTH) 2000 units TABS Take 1 tablet (2,000 Units total) by mouth daily.  03/02/17  Yes Leander Tout, Gwyndolyn Saxon, MD  Cyanocobalamin (B-12) 500 MCG TABS Take 1 tablet by mouth daily. 03/02/17  Yes Korynne Dols, Gwyndolyn Saxon, MD  dutasteride (AVODART) 0.5 MG capsule Take 0.5 mg by mouth daily.   Yes [provider]  leuprolide (LUPRON DEPOT, 1-MONTH,) 22.5 MG injection Inject into the muscle.   Yes [provider]  lisinopril (PRINIVIL,ZESTRIL) 5 MG tablet Take 1 tablet (5 mg total) by mouth daily. 03/29/17  Yes Glessie Eustice, Gwyndolyn Saxon, MD  Multiple Vitamin (MULTI-VITAMINS) TABS Take by mouth.   Yes [provider]  pantoprazole (PROTONIX) 40 MG tablet Take 40 mg by mouth daily.   Yes [provider]    Allergies  Allergen Reactions  . Nsaids Other (See Comments)    Hx of PUD  . Statins Rash    Hyperglycemia    Past Surgical History:  Procedure Laterality Date  . APPENDECTOMY    . CHOLECYSTECTOMY    . GASTRECTOMY    . INSERTION PROSTATE RADIATION SEED  2002   Brachytherapy  . nasal tumor      Social History   Tobacco Use  . Smoking status: Former Research scientist (life sciences)  . Smokeless tobacco: Never Used  Substance Use Topics  . Alcohol use: Yes  . Drug use: No    Family History  Problem Relation Age of Onset  . Cancer Mother   . Diabetes Father   . Prostate cancer Neg Hx   . Kidney cancer Neg Hx     Medication list has been reviewed and updated.  Physical Examination: BP 138/80   Pulse Marland Kitchen)  56   Resp 16   Ht '5\' 7"'  (1.702 m)   Wt 195 lb (88.5 kg)   SpO2 98%   BMI 30.54 kg/m   Physical Exam  Constitutional: He appears well-developed and well-nourished.  Cardiovascular: Normal rate, regular rhythm and normal heart sounds.  Pulmonary/Chest: Effort normal and breath sounds normal.  Musculoskeletal: He exhibits no edema.  Neurological: He is alert.  Skin: Skin is warm and dry.  Psychiatric: He has a normal mood and affect. His behavior is normal.  Nursing note and vitals reviewed.   Assessment and Plan:  1. Type 2 diabetes, controlled, with  neuropathy ([name]) Well controlled on diet, neuropathy sxs stable - HgB A1c  2. Prostate cancer ([place]) Stable on current regimen, followed by urology  3. Right hip pain Likely OA but consider prostate met, Tylenol 1000 mg bid prn - DG HIP UNILAT WITH PELVIS 2-3 VIEWS RIGHT; Future  4. Microalbuminuria On lisinopril - Urine Microalbumin w/creat. ratio  5. Obesity (BMI 30-39.9) Weight down 9#, encouraged exercise/weight loss  6. Vitamin D deficiency, unspecified On supplement - Vitamin D (25 hydroxy)  7. B12 deficiency On supplement - B12  8. Healthcare maintenance - Hepatitis C Antibody  Return in about 3 months (around 09/27/2017).  Satira Anis. [facility]  06/30/2017

## 2017-07-01 LAB — VITAMIN D 25 HYDROXY (VIT D DEFICIENCY, FRACTURES): VIT D 25 HYDROXY: 51.5 ng/mL (ref 30.0–100.0)

## 2017-07-01 LAB — MICROALBUMIN / CREATININE URINE RATIO
CREATININE, UR: 25.8 mg/dL
Microalb Creat Ratio: <11.6 mg/g creat (ref 0.0–30.0)
Microalb, Ur: <3 ug/mL — ABNORMAL HIGH

## 2017-07-01 LAB — HEPATITIS C ANTIBODY: HCV Ab: <0.1 s/co ratio (ref 0.0–0.9)

## 2017-08-05 DIAGNOSIS — E559 Vitamin D deficiency, unspecified: Secondary | ICD-10-CM | POA: Diagnosis not present

## 2017-08-05 DIAGNOSIS — C61 Malignant neoplasm of prostate: Secondary | ICD-10-CM | POA: Diagnosis not present

## 2017-08-23 ENCOUNTER — Encounter: Payer: Self-pay | Admitting: Urology

## 2017-08-27 ENCOUNTER — Ambulatory Visit: Payer: Medicare Other | Admitting: Urology

## 2017-09-16 ENCOUNTER — Telehealth: Payer: Self-pay | Admitting: Family Medicine

## 2017-09-16 NOTE — Telephone Encounter (Signed)
Called to schedule Medicare Annual Wellness Visit with Nurse Health Advisor. If patient returns call, please schedule AWV with NHA any date  ° °Thank you! °For any questions please contact: °Kathryn Brown °[phone]  °Skype [email or web address]  ° °

## 2017-09-20 ENCOUNTER — Ambulatory Visit (INDEPENDENT_AMBULATORY_CARE_PROVIDER_SITE_OTHER): Payer: Medicare Other

## 2017-09-20 VITALS — BP 124/70 | HR 65 | Temp 98.5°F | Resp 12 | Ht 67.0 in | Wt 207.0 lb

## 2017-09-20 DIAGNOSIS — Z0000 Encounter for general adult medical examination without abnormal findings: Secondary | ICD-10-CM | POA: Diagnosis not present

## 2017-09-20 NOTE — Progress Notes (Signed)
Subjective:   Juan [name] is a 67 y.o. male who presents for Medicare Annual/Subsequent preventive examination.  Review of Systems:  N/A Cardiac Risk Factors include: advanced age (>22men, >70 women);diabetes mellitus;dyslipidemia;hypertension;male gender;obesity (BMI >30kg/m2);sedentary lifestyle;smoking/ tobacco exposure     Objective:    Vitals: BP 124/70 (BP Location: Right Arm, Patient Position: Sitting, Cuff Size: Normal)   Pulse 65   Temp 98.5 F (36.9 C) (Oral)   Resp 12   Ht 5\' 7"  (1.702 m)   Wt 207 lb (93.9 kg)   SpO2 94%   BMI 32.42 kg/m   Body mass index is 32.42 kg/m.  Advanced Directives 09/20/2017 03/01/2017  Does Patient Have a Medical Advance Directive? No No  Would patient like information on creating a medical advance directive? Yes (MAU/Ambulatory/Procedural Areas - Information given) -    Tobacco Social History   Tobacco Use  Smoking Status Former Smoker  . Packs/day: 2.00  . Years: 35.00  . Pack years: 70.00  . Types: Cigarettes  . Last attempt to quit: 1998  . Years since quitting: 21.3  Smokeless Tobacco Never Used  Tobacco Comment   smoking cessation materials not required     Counseling given: No Comment: smoking cessation materials not required  Clinical Intake:  Pre-visit preparation completed: Yes  Pain : No/denies pain   BMI - recorded: 30.54 Nutritional Status: BMI > 30  Obese Nutrition Risk Assessment: Has the patient had any N/V/D within the last 2 months?  No Does the patient have any non-healing wounds?  No Has the patient had any unintentional weight loss or weight gain?  No  Is the patient diabetic?  Yes If diabetic, was a CBG obtained today?  No Did the patient bring in their glucometer from home?  No Comments: Pt states he does not monitor CBG's.  Diabetic Exams: Diabetic Eye Exam: 01/04/17 Diabetic Foot Exam: Overdue for diabetic foot exam. Pt has been advised about the importance in completing this exam.  Advised to keep his appointment with Dr. Ronnald Ramp as scheduled. Verbalized acceptance and understanding.  How often do you need to have someone help you when you read instructions, pamphlets, or other written materials from your doctor or pharmacy?: 1 - Never  Interpreter Needed?: No  Information entered by :: Idell Pickles, LPN  Past Medical History:  Diagnosis Date  . Arthritis 08/28/2016  . Atrophic kidney 01/13/2013   Last Assessment & Plan:  Atrophic and nonfunctional LEFT kidney.  Discussed at length today. Asymptomatic. No intervention needed.  Overview:  Last Assessment & Plan:  Atrophic and nonfunctional LEFT kidney.  Discussed at length today. Asymptomatic. No intervention needed. Last Assessment & Plan:  Atrophic and nonfunctional LEFT kidney.  Discussed at length today. Asymptomatic. No intervention needed.  . Cancer ([place])   . COPD (chronic obstructive pulmonary disease) ([place]) 08/28/2016  . Diabetes mellitus type 2, uncomplicated ([place]) 2/72/5366  . ED (erectile dysfunction) 08/28/2016  . GERD (gastroesophageal reflux disease)   . Gross hematuria 07/22/2016   Overview:  Last Assessment & Plan:  The differential diagnosis for hematuria was reviewed.  Possible etiologies include, but are not limited to urolithiasis, infection, urothelial or renal malignancies, medical renal disease, and benign idiopathic hematuria.  The workup was reviewed and he will need a urinary cytology testing, a CT urogram, and Cystourethroscopy.  He has moved to [place], [place] and would like to have all this done with a local urologist since it is an hour and a half drive. Will send records there  and have the work performed locally.   > 15 minutes were spent in face to face contact with the patient, >50% of which was spent counseling about about the diagnosis and plan.  Last Assessment & Plan:  The differential diagnosis for hematuria was reviewed.  Possible etiologies include, but are not limited to urolithiasis, infection,  urothelial or renal malignancies, medical renal disease, and benign idiopathic hematuria.  The workup was reviewed and he will need a urinary cytology testing,   . H/O vagotomy 10/11/2012  . Hyperlipemia   . Hypertension 08/28/2016  . Kidney stones 08/28/2016   Last Assessment & Plan:  Can't see stone on KUB today again  Pt instructed to call or go to ER for acute flank pain since he only has one functioning kidney.  Currently no evidence of any obstructive uropathy.  Check chem 8 and call with results.  F/u one year for KUB  . Knee contusion 10/20/2013  . Peptic ulcer 08/28/2016  . Prostate cancer ([place])   . Sarcoidosis 04/04/2015  . Status post partial gastrectomy 10/11/2012   Past Surgical History:  Procedure Laterality Date  . APPENDECTOMY    . CHOLECYSTECTOMY    . GASTRECTOMY    . INSERTION PROSTATE RADIATION SEED  2002   Brachytherapy  . nasal tumor     Family History  Problem Relation Age of Onset  . Cancer Mother        lung  . Diabetes Father   . Congestive Heart Failure Father   . Prostate cancer Neg Hx   . Kidney cancer Neg Hx    Social History   Socioeconomic History  . Marital status: Divorced    Spouse name: Not on file  . Number of children: 2  . Years of education: Not on file  . Highest education level: Bachelor's degree (e.g., BA, AB, BS)  Occupational History    Employer: [employer]  Social Needs  . Financial resource strain: Not hard at all  . Food insecurity:    Worry: Never true    Inability: Never true  . Transportation needs:    Medical: No    Non-medical: No  Tobacco Use  . Smoking status: Former Smoker    Packs/day: 2.00    Years: 35.00    Pack years: 70.00    Types: Cigarettes    Last attempt to quit: 1998    Years since quitting: 21.3  . Smokeless tobacco: Never Used  . Tobacco comment: smoking cessation materials not required  Substance and Sexual Activity  . Alcohol use: Yes    Comment: 1 beer every other week  . Drug use: No  .  Sexual activity: Not Currently  Lifestyle  . Physical activity:    Days per week: 0 days    Minutes per session: 0 min  . Stress: Not at all  Relationships  . Social connections:    Talks on phone: Patient refused    Gets together: Patient refused    Attends religious service: Patient refused    Active member of club or organization: Patient refused    Attends meetings of clubs or organizations: Patient refused    Relationship status: Divorced  Other Topics Concern  . Not on file  Social History Narrative  . Not on file    Outpatient Encounter Medications as of 09/20/2017  Medication Sig  . acetaminophen (TYLENOL) 500 MG tablet Take 1,000 mg by mouth 2 (two) times daily as needed for pain.  . bicalutamide (CASODEX)  50 MG tablet   . Cholecalciferol (GNP VITAMIN D MAXIMUM STRENGTH) 2000 units TABS Take 1 tablet (2,000 Units total) by mouth daily.  . Cyanocobalamin (B-12) 500 MCG TABS Take 1 tablet by mouth daily.  Marland Kitchen dutasteride (AVODART) 0.5 MG capsule Take 0.5 mg by mouth daily.  Marland Kitchen leuprolide (LUPRON DEPOT, 45-MONTH,) 22.5 MG injection Inject into the muscle.  . lisinopril (PRINIVIL,ZESTRIL) 5 MG tablet Take 1 tablet (5 mg total) by mouth daily.  . Multiple Vitamin (MULTI-VITAMINS) TABS Take by mouth.  . pantoprazole (PROTONIX) 40 MG tablet Take 40 mg by mouth daily.   No facility-administered encounter medications on file as of 09/20/2017.     Activities of Daily Living In your present state of health, do you have any difficulty performing the following activities: 09/20/2017 03/01/2017  Hearing? N Y  Comment B hearing aids; tinnitus hearing aids  Vision? N Y  Comment denies eyeglasses -  Difficulty concentrating or making decisions? N N  Walking or climbing stairs? Y N  Comment joint pain -  Dressing or bathing? N N  Doing errands, shopping? N N  Preparing Food and eating ? N -  Comment partial upper and lower dentures -  Using the Toilet? N -  In the past six months,  have you accidently leaked urine? N -  Do you have problems with loss of bowel control? N -  Managing your Medications? N -  Managing your Finances? N -  Housekeeping or managing your Housekeeping? N -  Some recent data might be hidden    Patient Care Team: Juline Patch, MD as PCP - General (Family Medicine) Elza Rafter, MD as Consulting Physician (Internal Medicine) Hollice Espy, MD as Consulting Physician (Urology)   Assessment:   This is a routine wellness examination for Juan [name].  Exercise Activities and Dietary recommendations Current Exercise Habits: The patient does not participate in regular exercise at present, Exercise limited by: None identified  Goals    . DIET - INCREASE WATER INTAKE     Recommend to drink at least 6-8 8oz glasses of water per day.       Fall Risk Fall Risk  09/20/2017 03/01/2017  Falls in the past year? No No  Risk for fall due to : History of fall(s) -  Risk for fall due to: Comment slipped on deck -   FALL RISK PREVENTION PERTAINING TO HOME: Is your home free of loose throw rugs in walkways, pet beds, electrical cords, etc? Yes Is there adequate lighting in your home to reduce risk of falls?  Yes Are there stairs in or around your home WITH handrails? Yes  ASSISTIVE DEVICES UTILIZED TO PREVENT FALLS: Use of a cane, walker or w/c? No Grab bars in the bathroom? Yes  Shower chair or a place to sit while bathing? Yes An elevated toilet seat or a handicapped toilet? No  Timed Get Up and Go Performed: Yes. Pt ambulated 10 feet within 8 sec. Gait stead-fast and without the use of an assistive device. No intervention required at this time. Fall risk prevention has been discussed.  Community Resource Referral:  Pt declined my offer to send [employer] Referral to Care Guide for an elevated toilet seat.  Depression Screen PHQ 2/9 Scores 09/20/2017 03/01/2017  PHQ - 2 Score 0 0  PHQ- 9 Score 0 -    Cognitive Function       6CIT Screen 09/20/2017  What Year? 0 points  What month? 0 points  What time?  0 points  Count back from 20 0 points  Months in reverse 0 points  Repeat phrase 0 points  Total Score 0    Immunization History  Administered Date(s) Administered  . Influenza, High Dose Seasonal PF 03/01/2017  . Influenza-Unspecified 03/01/2017  . Pneumococcal Conjugate-13 03/01/2017  . Pneumococcal Polysaccharide-23 03/21/2009, 04/01/2015  . Tdap 10/15/2013  . Tetanus 11/09/2006  . Zoster 05/04/2008    Qualifies for Shingles Vaccine? Yes. Zostavax completed 05/04/08. Due for Shingrix. Education has been provided regarding the importance of this vaccine. Pt has been advised to call his insurance company to determine his out of pocket expense. Advised he may also receive this vaccine at his local pharmacy or Health Dept. Verbalized acceptance and understanding.  Screening Tests Health Maintenance  Topic Date Due  . FOOT EXAM  11/09/1960  . INFLUENZA VACCINE  12/02/2017  . HEMOGLOBIN A1C  12/28/2017  . OPHTHALMOLOGY EXAM  01/04/2018  . TETANUS/TDAP  10/16/2023  . COLONOSCOPY  05/04/2025  . Hepatitis C Screening  Completed  . PNA vac Low Risk Adult  Completed   Cancer Screenings: Lung: Low Dose CT Chest recommended if Age 68-80 years, 30 pack-year currently smoking OR have quit w/in 15years. Patient does qualify. Declined to be referred to Burgess Estelle, RN for lung cancer screening. Colorectal: Completed 05/05/15. Repeat every 10 years  Additional Screenings: Hepatitis C Screening: Completed 06/30/17     Plan:  I have personally reviewed and addressed the Medicare Annual Wellness questionnaire and have noted the following in the patient's chart:  A. Medical and social history B. Use of alcohol, tobacco or illicit drugs  C. Current medications and supplements D. Functional ability and status E.  Nutritional status F.  Physical activity G. Advance directives H. List of other physicians I.   Hospitalizations, surgeries, and ER visits in previous 12 months J.  [place] such as hearing and vision if needed, cognitive and depression L. Referrals and appointments  In addition, I have reviewed and discussed with patient certain preventive protocols, quality metrics, and best practice recommendations. A written personalized care plan for preventive services as well as general preventive health recommendations were provided to patient.  Signed,  Aleatha Borer, LPN Nurse Health Advisor  MD Recommendations: Diabetic Foot Exam: Overdue for diabetic foot exam. Pt has been advised about the importance in completing this exam. Advised to keep his appointment with Dr. Ronnald Ramp as scheduled. Verbalized acceptance and understanding.  Zostavax completed 05/04/08. Due for Shingrix. Education has been provided regarding the importance of this vaccine. Pt has been advised to call his insurance company to determine his out of pocket expense. Advised he may also receive this vaccine at his local pharmacy or Health Dept. Verbalized acceptance and understanding.

## 2017-09-20 NOTE — Patient Instructions (Signed)
Mr. Juan [name] , Thank you for taking time to come for your Medicare Wellness Visit. I appreciate your ongoing commitment to your health goals. Please review the following plan we discussed and let me know if I can assist you in the future.   Screening recommendations/referrals: Colorectal Screening: Completed 05/05/15. Repeat every 10 years  Vision and Dental Exams: Recommended annual ophthalmology exams for early detection of glaucoma and other disorders of the eye Recommended annual dental exams for proper oral hygiene  Diabetic Exams: Recommended annual diabetic eye exams for early detection of retinopathy Recommended annual diabetic foot exams for early detection of peripheral neuropathy.  Diabetic Eye Exam: Completed 01/04/17 Diabetic Foot Exam: Please keep your appointment with Dr. Ronnald Ramp  Vaccinations: Influenza vaccine: Up to date Pneumococcal vaccine: Completed series Tdap vaccine: Up to date Shingles vaccine: Please call your insurance company to determine your out of pocket expense for the Shingrix vaccine. You may also receive this vaccine at your local pharmacy or Health Dept.  Advanced directives: Advance directive discussed with you today. I have provided a copy for you to complete at home and have notarized. Once this is complete please bring a copy in to our office so we can scan it into your chart.  Conditions/risks identified: Recommend to drink at least 6-8 8oz glasses of water per day.  Next appointment: Please schedule your Annual Wellness Visit with your Nurse Health Advisor in one year.  Preventive Care 61 Years and Older, Male Preventive care refers to lifestyle choices and visits with your health care provider that can promote health and wellness. What does preventive care include?  A yearly physical exam. This is also called an annual well check.  Dental exams once or twice a year.  Routine eye exams. Ask your health care provider how often you should have  your eyes checked.  Personal lifestyle choices, including:  Daily care of your teeth and gums.  Regular physical activity.  Eating a healthy diet.  Avoiding tobacco and drug use.  Limiting alcohol use.  Practicing safe sex.  Taking low doses of aspirin every day.  Taking vitamin and mineral supplements as recommended by your health care provider. What happens during an annual well check? The services and screenings done by your health care provider during your annual well check will depend on your age, overall health, lifestyle risk factors, and family history of disease. Counseling  Your health care provider may ask you questions about your:  Alcohol use.  Tobacco use.  Drug use.  Emotional well-being.  Home and relationship well-being.  Sexual activity.  Eating habits.  History of falls.  Memory and ability to understand (cognition).  Work and work Statistician. Screening  You may have the following tests or measurements:  Height, weight, and BMI.  Blood pressure.  Lipid and cholesterol levels. These may be checked every 5 years, or more frequently if you are over 34 years old.  Skin check.  Lung cancer screening. You may have this screening every year starting at age 1 if you have a 30-pack-year history of smoking and currently smoke or have quit within the past 15 years.  Fecal occult blood test (FOBT) of the stool. You may have this test every year starting at age 25.  Flexible sigmoidoscopy or colonoscopy. You may have a sigmoidoscopy every 5 years or a colonoscopy every 10 years starting at age 29.  Prostate cancer screening. Recommendations will vary depending on your family history and other risks.  Hepatitis C blood  test.  Hepatitis B blood test.  Sexually transmitted disease (STD) testing.  Diabetes screening. This is done by checking your blood sugar (glucose) after you have not eaten for a while (fasting). You may have this done every  1-3 years.  Abdominal aortic aneurysm (AAA) screening. You may need this if you are a current or former smoker.  Osteoporosis. You may be screened starting at age 75 if you are at high risk. Talk with your health care provider about your test results, treatment options, and if necessary, the need for more tests. Vaccines  Your health care provider may recommend certain vaccines, such as:  Influenza vaccine. This is recommended every year.  Tetanus, diphtheria, and acellular pertussis (Tdap, Td) vaccine. You may need a Td booster every 10 years.  Zoster vaccine. You may need this after age 58.  Pneumococcal 13-valent conjugate (PCV13) vaccine. One dose is recommended after age 65.  Pneumococcal polysaccharide (PPSV23) vaccine. One dose is recommended after age 16. Talk to your health care provider about which screenings and vaccines you need and how often you need them. This information is not intended to replace advice given to you by your health care provider. Make sure you discuss any questions you have with your health care provider. Document Released: 05/17/2015 Document Revised: 01/08/2016 Document Reviewed: 02/19/2015 Elsevier Interactive Patient Education  2017 [place] Prevention in the Home Falls can cause injuries. They can happen to people of all ages. There are many things you can do to make your home safe and to help prevent falls. What can I do on the outside of my home?  Regularly fix the edges of walkways and driveways and fix any cracks.  Remove anything that might make you trip as you walk through a door, such as a raised step or threshold.  Trim any bushes or trees on the path to your home.  Use bright outdoor lighting.  Clear any walking paths of anything that might make someone trip, such as rocks or tools.  Regularly check to see if handrails are loose or broken. Make sure that both sides of any steps have handrails.  Any raised decks and  porches should have guardrails on the edges.  Have any leaves, snow, or ice cleared regularly.  Use sand or salt on walking paths during winter.  Clean up any spills in your garage right away. This includes oil or grease spills. What can I do in the bathroom?  Use night lights.  Install grab bars by the toilet and in the tub and shower. Do not use towel bars as grab bars.  Use non-skid mats or decals in the tub or shower.  If you need to sit down in the shower, use a plastic, non-slip stool.  Keep the floor dry. Clean up any water that spills on the floor as soon as it happens.  Remove soap buildup in the tub or shower regularly.  Attach bath mats securely with double-sided non-slip rug tape.  Do not have throw rugs and other things on the floor that can make you trip. What can I do in the bedroom?  Use night lights.  Make sure that you have a light by your bed that is easy to reach.  Do not use any sheets or blankets that are too big for your bed. They should not hang down onto the floor.  Have a firm chair that has side arms. You can use this for support while you get dressed.  Do not have throw rugs and other things on the floor that can make you trip. What can I do in the kitchen?  Clean up any spills right away.  Avoid walking on wet floors.  Keep items that you use a lot in easy-to-reach places.  If you need to reach something above you, use a strong step stool that has a grab bar.  Keep electrical cords out of the way.  Do not use floor polish or wax that makes floors slippery. If you must use wax, use non-skid floor wax.  Do not have throw rugs and other things on the floor that can make you trip. What can I do with my stairs?  Do not leave any items on the stairs.  Make sure that there are handrails on both sides of the stairs and use them. Fix handrails that are broken or loose. Make sure that handrails are as long as the stairways.  Check any  carpeting to make sure that it is firmly attached to the stairs. Fix any carpet that is loose or worn.  Avoid having throw rugs at the top or bottom of the stairs. If you do have throw rugs, attach them to the floor with carpet tape.  Make sure that you have a light switch at the top of the stairs and the bottom of the stairs. If you do not have them, ask someone to add them for you. What else can I do to help prevent falls?  Wear shoes that:  Do not have high heels.  Have rubber bottoms.  Are comfortable and fit you well.  Are closed at the toe. Do not wear sandals.  If you use a stepladder:  Make sure that it is fully opened. Do not climb a closed stepladder.  Make sure that both sides of the stepladder are locked into place.  Ask someone to hold it for you, if possible.  Clearly mark and make sure that you can see:  Any grab bars or handrails.  First and last steps.  Where the edge of each step is.  Use tools that help you move around (mobility aids) if they are needed. These include:  Canes.  Walkers.  Scooters.  Crutches.  Turn on the lights when you go into a dark area. Replace any light bulbs as soon as they burn out.  Set up your furniture so you have a clear path. Avoid moving your furniture around.  If any of your floors are uneven, fix them.  If there are any pets around you, be aware of where they are.  Review your medicines with your doctor. Some medicines can make you feel dizzy. This can increase your chance of falling. Ask your doctor what other things that you can do to help prevent falls. This information is not intended to replace advice given to you by your health care provider. Make sure you discuss any questions you have with your health care provider. Document Released: 02/14/2009 Document Revised: 09/26/2015 Document Reviewed: 05/25/2014 Elsevier Interactive Patient Education  2017 [employer].

## 2017-09-23 DIAGNOSIS — Z923 Personal history of irradiation: Secondary | ICD-10-CM | POA: Diagnosis not present

## 2017-09-23 DIAGNOSIS — E559 Vitamin D deficiency, unspecified: Secondary | ICD-10-CM | POA: Diagnosis not present

## 2017-09-23 DIAGNOSIS — Z87891 Personal history of nicotine dependence: Secondary | ICD-10-CM | POA: Diagnosis not present

## 2017-09-23 DIAGNOSIS — C61 Malignant neoplasm of prostate: Secondary | ICD-10-CM | POA: Diagnosis not present

## 2017-09-28 DIAGNOSIS — H34831 Tributary (branch) retinal vein occlusion, right eye, with macular edema: Secondary | ICD-10-CM | POA: Diagnosis not present

## 2017-09-29 ENCOUNTER — Ambulatory Visit (INDEPENDENT_AMBULATORY_CARE_PROVIDER_SITE_OTHER): Payer: Medicare Other | Admitting: Family Medicine

## 2017-09-29 ENCOUNTER — Ambulatory Visit: Payer: Medicare Other | Admitting: Family Medicine

## 2017-09-29 ENCOUNTER — Encounter: Payer: Self-pay | Admitting: Family Medicine

## 2017-09-29 VITALS — BP 122/80 | HR 64 | Ht 67.0 in | Wt 206.0 lb

## 2017-09-29 DIAGNOSIS — E1140 Type 2 diabetes mellitus with diabetic neuropathy, unspecified: Secondary | ICD-10-CM

## 2017-09-29 DIAGNOSIS — Z8719 Personal history of other diseases of the digestive system: Secondary | ICD-10-CM

## 2017-09-29 DIAGNOSIS — K3189 Other diseases of stomach and duodenum: Secondary | ICD-10-CM

## 2017-09-29 DIAGNOSIS — K219 Gastro-esophageal reflux disease without esophagitis: Secondary | ICD-10-CM

## 2017-09-29 DIAGNOSIS — R809 Proteinuria, unspecified: Secondary | ICD-10-CM

## 2017-09-29 DIAGNOSIS — Z903 Acquired absence of stomach [part of]: Secondary | ICD-10-CM

## 2017-09-29 DIAGNOSIS — E1310 Other specified diabetes mellitus with ketoacidosis without coma: Secondary | ICD-10-CM | POA: Insufficient documentation

## 2017-09-29 MED ORDER — PANTOPRAZOLE SODIUM 40 MG PO TBEC: 40.0000 mg | DELAYED_RELEASE_TABLET | Freq: Two times a day (BID) | ORAL | 1 refills | Status: DC

## 2017-09-29 MED ORDER — LISINOPRIL 5 MG PO TABS: 5.0000 mg | ORAL_TABLET | Freq: Every day | ORAL | 3 refills | Status: DC

## 2017-09-29 NOTE — Assessment & Plan Note (Signed)
Controlled on pantoprazole 40 mg bid

## 2017-09-29 NOTE — Progress Notes (Signed)
Name: Juan [name]   MRN: [id number]    DOB: Feb 26, 1951   Date:09/29/2017       Progress Note  Subjective  Chief Complaint  Chief Complaint  Patient presents with  . Hypertension  . Gastroesophageal Reflux    Hypertension  This is a chronic problem. The current episode started more than 1 year ago. The problem has been waxing and waning since onset. The problem is controlled. Pertinent negatives include no anxiety, blurred vision, chest pain, headaches, malaise/fatigue, neck pain, orthopnea, palpitations, peripheral edema, PND, shortness of breath or sweats. There are no associated agents to hypertension. There are no known risk factors for coronary artery disease. Past treatments include ACE inhibitors. The current treatment provides moderate improvement. There are no compliance problems.  There is no history of angina, kidney disease, CAD/MI, CVA, heart failure, left ventricular hypertrophy, PVD or retinopathy. There is no history of chronic renal disease, a hypertension causing med or renovascular disease.  Gastroesophageal Reflux  He reports no abdominal pain, no belching, no chest pain, no choking, no coughing, no dysphagia, no early satiety, no globus sensation, no heartburn, no hoarse voice, no nausea, no sore throat, no stridor, no tooth decay, no water brash or no wheezing. This is a chronic problem. The current episode started more than 1 year ago. The problem occurs constantly. The problem has been unchanged. Nothing aggravates the symptoms. Pertinent negatives include no anemia, fatigue, melena, muscle weakness, orthopnea or weight loss. He has tried a PPI for the symptoms. The treatment provided moderate relief. Past procedures do not include an abdominal ultrasound, an EGD, esophageal manometry, esophageal pH monitoring or H. pylori antibody titer.  Diabetes  He presents for his follow-up diabetic visit. He has type 2 diabetes mellitus. His disease course has been stable. Pertinent  negatives for hypoglycemia include no confusion, dizziness, headaches, hunger, mood changes, nervousness/anxiousness, pallor, seizures, sleepiness, speech difficulty, sweats or tremors. Associated symptoms include foot paresthesias. Pertinent negatives for diabetes include no blurred vision, no chest pain, no fatigue, no foot ulcerations, no polydipsia, no polyphagia, no polyuria, no visual change, no weakness and no weight loss. There are no hypoglycemic complications. Symptoms are stable. Pertinent negatives for diabetic complications include no autonomic neuropathy, CVA, heart disease, impotence, nephropathy, peripheral neuropathy, PVD or retinopathy. Risk factors for coronary artery disease include dyslipidemia and hypertension. Current diabetic treatment includes diet. He is compliant with treatment some of the time. He is following a generally healthy diet. Meal planning includes avoidance of concentrated sweets. Home blood sugar record trend: not checked. His breakfast blood glucose is taken between 8-9 am. An ACE inhibitor/angiotensin II receptor blocker is being taken. He does not see a podiatrist.Eye exam is current.    Gastroesophageal reflux disease Controlled on pantoprazole 40 mg bid  Type 2 diabetes, controlled, with neuropathy ([place]) Diet controlled with last a1c 2/19 was 5.4  Microalbuminuria Last checked on 10/18 with mild elevation. Will continue lisinopril 5 mg q day  Status post partial gastrectomy History of partial gastrectomy due to hypersecretion. Will continue current dose pantoprazole 40mg  bid.  Gastric hypersecretion History of duodenal ulcer with partial gastrectomy. Continue maintence pantoprazole on 40 mg bib   Past Medical History:  Diagnosis Date  . Arthritis 08/28/2016  . Atrophic kidney 01/13/2013   Last Assessment & Plan:  Atrophic and nonfunctional LEFT kidney.  Discussed at length today. Asymptomatic. No intervention needed.  Overview:  Last Assessment &  Plan:  Atrophic and nonfunctional LEFT kidney.  Discussed at length today.  Asymptomatic. No intervention needed. Last Assessment & Plan:  Atrophic and nonfunctional LEFT kidney.  Discussed at length today. Asymptomatic. No intervention needed.  . Cancer ([place])   . COPD (chronic obstructive pulmonary disease) ([place]) 08/28/2016  . Diabetes mellitus type 2, uncomplicated ([place]) 08/28/621  . ED (erectile dysfunction) 08/28/2016  . GERD (gastroesophageal reflux disease)   . Gross hematuria 07/22/2016   Overview:  Last Assessment & Plan:  The differential diagnosis for hematuria was reviewed.  Possible etiologies include, but are not limited to urolithiasis, infection, urothelial or renal malignancies, medical renal disease, and benign idiopathic hematuria.  The workup was reviewed and he will need a urinary cytology testing, a CT urogram, and Cystourethroscopy.  He has moved to [place], [place] and would like to have all this done with a local urologist since it is an hour and a half drive. Will send records there and have the work performed locally.   > 15 minutes were spent in face to face contact with the patient, >50% of which was spent counseling about about the diagnosis and plan.  Last Assessment & Plan:  The differential diagnosis for hematuria was reviewed.  Possible etiologies include, but are not limited to urolithiasis, infection, urothelial or renal malignancies, medical renal disease, and benign idiopathic hematuria.  The workup was reviewed and he will need a urinary cytology testing,   . H/O vagotomy 10/11/2012  . Hyperlipemia   . Hypertension 08/28/2016  . Kidney stones 08/28/2016   Last Assessment & Plan:  Can't see stone on KUB today again  Pt instructed to call or go to ER for acute flank pain since he only has one functioning kidney.  Currently no evidence of any obstructive uropathy.  Check chem 8 and call with results.  F/u one year for KUB  . Knee contusion 10/20/2013  . Peptic ulcer  08/28/2016  . Prostate cancer ([place])   . Sarcoidosis 04/04/2015  . Status post partial gastrectomy 10/11/2012    Past Surgical History:  Procedure Laterality Date  . APPENDECTOMY    . CHOLECYSTECTOMY    . GASTRECTOMY    . INSERTION PROSTATE RADIATION SEED  2002   Brachytherapy  . nasal tumor      Family History  Problem Relation Age of Onset  . Cancer Mother        lung  . Diabetes Father   . Congestive Heart Failure Father   . Prostate cancer Neg Hx   . Kidney cancer Neg Hx     Social History   Socioeconomic History  . Marital status: Divorced    Spouse name: Not on file  . Number of children: 2  . Years of education: Not on file  . Highest education level: Bachelor's degree (e.g., BA, AB, BS)  Occupational History    Employer: [employer]  Social Needs  . Financial resource strain: Not hard at all  . Food insecurity:    Worry: Never true    Inability: Never true  . Transportation needs:    Medical: No    Non-medical: No  Tobacco Use  . Smoking status: Former Smoker    Packs/day: 2.00    Years: 35.00    Pack years: 70.00    Types: Cigarettes    Last attempt to quit: 1998    Years since quitting: 21.4  . Smokeless tobacco: Never Used  . Tobacco comment: smoking cessation materials not required  Substance and Sexual Activity  . Alcohol use: Yes    Comment: 1  beer every other week  . Drug use: No  . Sexual activity: Not Currently  Lifestyle  . Physical activity:    Days per week: 0 days    Minutes per session: 0 min  . Stress: Not at all  Relationships  . Social connections:    Talks on phone: Patient refused    Gets together: Patient refused    Attends religious service: Patient refused    Active member of club or organization: Patient refused    Attends meetings of clubs or organizations: Patient refused    Relationship status: Divorced  . Intimate partner violence:    Fear of current or ex partner: No    Emotionally abused: No    Physically  abused: No    Forced sexual activity: No  Other Topics Concern  . Not on file  Social History Narrative  . Not on file    Allergies  Allergen Reactions  . Nsaids Other (See Comments)    Hx of PUD  . Statins Rash    Hyperglycemia    Outpatient Medications Prior to Visit  Medication Sig Dispense Refill  . acetaminophen (TYLENOL) 500 MG tablet Take 1,000 mg by mouth 2 (two) times daily as needed for pain.    . bicalutamide (CASODEX) 50 MG tablet Dr Gwenlyn Found    . Cholecalciferol (GNP VITAMIN D MAXIMUM STRENGTH) 2000 units TABS Take 1 tablet (2,000 Units total) by mouth daily. 30 each   . Cyanocobalamin (B-12) 500 MCG TABS Take 1 tablet by mouth daily. 150 tablet   . dutasteride (AVODART) 0.5 MG capsule Take 0.5 mg by mouth daily. Dr Gwenlyn Found    . leuprolide (LUPRON DEPOT, 55-MONTH,) 22.5 MG injection Inject into the muscle. Dr Gwenlyn Found- cancer center    . Multiple Vitamin (MULTI-VITAMINS) TABS Take by mouth.    Marland Kitchen lisinopril (PRINIVIL,ZESTRIL) 5 MG tablet Take 1 tablet (5 mg total) by mouth daily. 90 tablet 3  . pantoprazole (PROTONIX) 40 MG tablet Take 40 mg by mouth 2 (two) times daily.      No facility-administered medications prior to visit.     Review of Systems  Constitutional: Negative for chills, fatigue, fever, malaise/fatigue and weight loss.  HENT: Negative for ear discharge, ear pain, hoarse voice and sore throat.   Eyes: Negative for blurred vision.  Respiratory: Negative for cough, sputum production, choking, shortness of breath and wheezing.   Cardiovascular: Negative for chest pain, palpitations, orthopnea, leg swelling and PND.  Gastrointestinal: Negative for abdominal pain, blood in stool, constipation, diarrhea, dysphagia, heartburn, melena and nausea.  Genitourinary: Negative for dysuria, frequency, hematuria, impotence and urgency.  Musculoskeletal: Negative for back pain, joint pain, myalgias, muscle weakness and neck pain.  Skin: Negative for pallor and rash.   Neurological: Negative for dizziness, tingling, tremors, sensory change, focal weakness, seizures, speech difficulty, weakness and headaches.  Endo/Heme/Allergies: Negative for environmental allergies, polydipsia and polyphagia. Does not bruise/bleed easily.  Psychiatric/Behavioral: Negative for confusion, depression and suicidal ideas. The patient is not nervous/anxious and does not have insomnia.      Objective  Vitals:   09/29/17 1054  BP: 122/80  Pulse: 64  Weight: 206 lb (93.4 kg)  Height: 5\' 7"  (1.702 m)    Physical Exam  Constitutional: He is oriented to person, place, and time.  HENT:  Head: Normocephalic.  Right Ear: External ear normal.  Left Ear: External ear normal.  Nose: Nose normal.  Mouth/Throat: Oropharynx is clear and moist.  Eyes: Pupils are equal, round, and reactive  to light. Conjunctivae and EOM are normal. Right eye exhibits no discharge. Left eye exhibits no discharge. No scleral icterus.  Neck: Normal range of motion. Neck supple. No JVD present. No tracheal deviation present. No thyromegaly present.  Cardiovascular: Normal rate, regular rhythm, normal heart sounds and intact distal pulses. Exam reveals no gallop and no friction rub.  No murmur heard. Pulses:      Dorsalis pedis pulses are 2+ on the right side, and 2+ on the left side.       Posterior tibial pulses are 2+ on the right side, and 2+ on the left side.  Pulmonary/Chest: Breath sounds normal. No respiratory distress. He has no wheezes. He has no rales.  Abdominal: Soft. Bowel sounds are normal. He exhibits no mass. There is no hepatosplenomegaly. There is no tenderness. There is no rebound, no guarding and no CVA tenderness.  Musculoskeletal: Normal range of motion. He exhibits no edema or tenderness.       Right foot: There is normal range of motion and no deformity.       Left foot: There is no deformity.  Feet:  Right Foot:  Protective Sensation: 10 sites tested. 8 sites sensed.  Skin  Integrity: Positive for dry skin. Negative for ulcer, blister, skin breakdown, erythema, warmth or callus.  Left Foot:  Protective Sensation: 10 sites tested. 8 sites sensed.  Skin Integrity: Positive for dry skin. Negative for ulcer, blister, skin breakdown, erythema, warmth or callus.  Lymphadenopathy:    He has no cervical adenopathy.  Neurological: He is alert and oriented to person, place, and time. He has normal strength and normal reflexes. No cranial nerve deficit.  Skin: Skin is warm. No rash noted.  Nursing note and vitals reviewed.     Assessment & Plan  Problem List Items Addressed This Visit      Digestive   Gastroesophageal reflux disease    Controlled on pantoprazole 40 mg bid      Relevant Medications   pantoprazole (PROTONIX) 40 MG tablet     Endocrine   Type 2 diabetes, controlled, with neuropathy ([place]) - Primary    Diet controlled with last a1c 2/19 was 5.4      Relevant Medications   lisinopril (PRINIVIL,ZESTRIL) 5 MG tablet     Other   History of duodenal ulcer   Status post partial gastrectomy    History of partial gastrectomy due to hypersecretion. Will continue current dose pantoprazole 40mg  bid.      Microalbuminuria    Last checked on 10/18 with mild elevation. Will continue lisinopril 5 mg q day      Gastric hypersecretion    History of duodenal ulcer with partial gastrectomy. Continue maintence pantoprazole on 40 mg bib         Meds ordered this encounter  Medications  . pantoprazole (PROTONIX) 40 MG tablet    Sig: Take 1 tablet (40 mg total) by mouth 2 (two) times daily.    Dispense:  180 tablet    Refill:  1  . lisinopril (PRINIVIL,ZESTRIL) 5 MG tablet    Sig: Take 1 tablet (5 mg total) by mouth daily.    Dispense:  90 tablet    Refill:  3      Dr. Otilio Miu [facility]  09/29/17

## 2017-09-29 NOTE — Assessment & Plan Note (Signed)
Diet controlled with last a1c 2/19 was 5.4

## 2017-09-29 NOTE — Assessment & Plan Note (Signed)
Last checked on 10/18 with mild elevation. Will continue lisinopril 5 mg q day

## 2017-09-29 NOTE — Assessment & Plan Note (Signed)
History of partial gastrectomy due to hypersecretion. Will continue current dose pantoprazole 40mg  bid.

## 2017-09-29 NOTE — Assessment & Plan Note (Signed)
History of duodenal ulcer with partial gastrectomy. Continue maintence pantoprazole on 40 mg bib

## 2017-09-30 LAB — HM DIABETES EYE EXAM

## 2017-10-05 ENCOUNTER — Other Ambulatory Visit: Payer: Self-pay

## 2017-10-05 NOTE — Progress Notes (Unsigned)
abstract

## 2017-11-09 DIAGNOSIS — C61 Malignant neoplasm of prostate: Secondary | ICD-10-CM | POA: Diagnosis not present

## 2017-12-21 DIAGNOSIS — Z79818 Long term (current) use of other agents affecting estrogen receptors and estrogen levels: Secondary | ICD-10-CM | POA: Diagnosis not present

## 2017-12-21 DIAGNOSIS — E559 Vitamin D deficiency, unspecified: Secondary | ICD-10-CM | POA: Diagnosis not present

## 2017-12-21 DIAGNOSIS — Z87891 Personal history of nicotine dependence: Secondary | ICD-10-CM | POA: Diagnosis not present

## 2017-12-21 DIAGNOSIS — C61 Malignant neoplasm of prostate: Secondary | ICD-10-CM | POA: Diagnosis not present

## 2017-12-28 DIAGNOSIS — Z8546 Personal history of malignant neoplasm of prostate: Secondary | ICD-10-CM | POA: Diagnosis not present

## 2018-01-04 ENCOUNTER — Encounter: Payer: Self-pay | Admitting: Urology

## 2018-01-31 DIAGNOSIS — H34831 Tributary (branch) retinal vein occlusion, right eye, with macular edema: Secondary | ICD-10-CM | POA: Diagnosis not present

## 2018-02-09 DIAGNOSIS — E559 Vitamin D deficiency, unspecified: Secondary | ICD-10-CM | POA: Diagnosis not present

## 2018-02-09 DIAGNOSIS — C61 Malignant neoplasm of prostate: Secondary | ICD-10-CM | POA: Diagnosis not present

## 2018-03-21 DIAGNOSIS — C61 Malignant neoplasm of prostate: Secondary | ICD-10-CM | POA: Diagnosis not present

## 2018-03-23 DIAGNOSIS — C61 Malignant neoplasm of prostate: Secondary | ICD-10-CM | POA: Diagnosis not present

## 2018-03-23 DIAGNOSIS — Z87891 Personal history of nicotine dependence: Secondary | ICD-10-CM | POA: Diagnosis not present

## 2018-03-23 DIAGNOSIS — E559 Vitamin D deficiency, unspecified: Secondary | ICD-10-CM | POA: Diagnosis not present

## 2018-03-29 ENCOUNTER — Ambulatory Visit (INDEPENDENT_AMBULATORY_CARE_PROVIDER_SITE_OTHER): Payer: Medicare Other

## 2018-03-29 DIAGNOSIS — Z23 Encounter for immunization: Secondary | ICD-10-CM

## 2018-04-05 ENCOUNTER — Other Ambulatory Visit: Payer: Self-pay | Admitting: Family Medicine

## 2018-04-28 ENCOUNTER — Ambulatory Visit
Admission: EM | Admit: 2018-04-28 | Discharge: 2018-04-28 | Disposition: A | Discharge status: Home / Self Care | Payer: Medicare Other | Attending: Family Medicine | Admitting: Family Medicine

## 2018-04-28 ENCOUNTER — Ambulatory Visit
Admit: 2018-04-28 | Discharge: 2018-04-28 | Disposition: A | Discharge status: Home / Self Care | Payer: Medicare Other | Attending: Emergency Medicine | Admitting: Emergency Medicine

## 2018-04-28 ENCOUNTER — Other Ambulatory Visit: Payer: Self-pay

## 2018-04-28 ENCOUNTER — Encounter: Payer: Self-pay | Admitting: Emergency Medicine

## 2018-04-28 ENCOUNTER — Ambulatory Visit: Payer: Medicare Other

## 2018-04-28 DIAGNOSIS — Z888 Allergy status to other drugs, medicaments and biological substances status: Secondary | ICD-10-CM | POA: Insufficient documentation

## 2018-04-28 DIAGNOSIS — I10 Essential (primary) hypertension: Secondary | ICD-10-CM | POA: Insufficient documentation

## 2018-04-28 DIAGNOSIS — R7989 Other specified abnormal findings of blood chemistry: Secondary | ICD-10-CM | POA: Diagnosis not present

## 2018-04-28 DIAGNOSIS — Z9049 Acquired absence of other specified parts of digestive tract: Secondary | ICD-10-CM | POA: Insufficient documentation

## 2018-04-28 DIAGNOSIS — I700 Atherosclerosis of aorta: Secondary | ICD-10-CM | POA: Diagnosis not present

## 2018-04-28 DIAGNOSIS — N261 Atrophy of kidney (terminal): Secondary | ICD-10-CM | POA: Insufficient documentation

## 2018-04-28 DIAGNOSIS — Z6831 Body mass index (BMI) 31.0-31.9, adult: Secondary | ICD-10-CM | POA: Diagnosis not present

## 2018-04-28 DIAGNOSIS — Z79899 Other long term (current) drug therapy: Secondary | ICD-10-CM | POA: Diagnosis not present

## 2018-04-28 DIAGNOSIS — E538 Deficiency of other specified B group vitamins: Secondary | ICD-10-CM | POA: Insufficient documentation

## 2018-04-28 DIAGNOSIS — I2510 Atherosclerotic heart disease of native coronary artery without angina pectoris: Secondary | ICD-10-CM | POA: Diagnosis not present

## 2018-04-28 DIAGNOSIS — R918 Other nonspecific abnormal finding of lung field: Secondary | ICD-10-CM | POA: Diagnosis not present

## 2018-04-28 DIAGNOSIS — J439 Emphysema, unspecified: Secondary | ICD-10-CM | POA: Insufficient documentation

## 2018-04-28 DIAGNOSIS — E785 Hyperlipidemia, unspecified: Secondary | ICD-10-CM | POA: Diagnosis not present

## 2018-04-28 DIAGNOSIS — Z886 Allergy status to analgesic agent status: Secondary | ICD-10-CM | POA: Diagnosis not present

## 2018-04-28 DIAGNOSIS — R1011 Right upper quadrant pain: Secondary | ICD-10-CM | POA: Diagnosis not present

## 2018-04-28 DIAGNOSIS — J181 Lobar pneumonia, unspecified organism: Secondary | ICD-10-CM

## 2018-04-28 DIAGNOSIS — J189 Pneumonia, unspecified organism: Secondary | ICD-10-CM | POA: Insufficient documentation

## 2018-04-28 DIAGNOSIS — E0789 Other specified disorders of thyroid: Secondary | ICD-10-CM | POA: Insufficient documentation

## 2018-04-28 DIAGNOSIS — K219 Gastro-esophageal reflux disease without esophagitis: Secondary | ICD-10-CM | POA: Insufficient documentation

## 2018-04-28 DIAGNOSIS — Z8546 Personal history of malignant neoplasm of prostate: Secondary | ICD-10-CM | POA: Insufficient documentation

## 2018-04-28 DIAGNOSIS — Z87891 Personal history of nicotine dependence: Secondary | ICD-10-CM | POA: Diagnosis not present

## 2018-04-28 DIAGNOSIS — E669 Obesity, unspecified: Secondary | ICD-10-CM | POA: Insufficient documentation

## 2018-04-28 DIAGNOSIS — E1140 Type 2 diabetes mellitus with diabetic neuropathy, unspecified: Secondary | ICD-10-CM | POA: Diagnosis not present

## 2018-04-28 DIAGNOSIS — R109 Unspecified abdominal pain: Secondary | ICD-10-CM | POA: Diagnosis present

## 2018-04-28 DIAGNOSIS — E559 Vitamin D deficiency, unspecified: Secondary | ICD-10-CM | POA: Diagnosis not present

## 2018-04-28 LAB — URINALYSIS, COMPLETE (UACMP) WITH MICROSCOPIC
Bacteria, UA: NONE SEEN
Bilirubin Urine: NEGATIVE
Glucose, UA: NEGATIVE mg/dL
Ketones, ur: NEGATIVE mg/dL
Nitrite: NEGATIVE
Protein, ur: 30 mg/dL — AB
Specific Gravity, Urine: 1.015 (ref 1.005–1.030)
Squamous Epithelial / LPF: NONE SEEN (ref 0–5)
pH: 5 (ref 5.0–8.0)

## 2018-04-28 LAB — CBC WITH DIFFERENTIAL/PLATELET
Abs Immature Granulocytes: 0.02 10*3/uL (ref 0.00–0.07)
Basophils Absolute: 0 10*3/uL (ref 0.0–0.1)
Basophils Relative: 0 %
Eosinophils Absolute: 0.2 10*3/uL (ref 0.0–0.5)
Eosinophils Relative: 2 %
HCT: 41.7 % (ref 39.0–52.0)
Hemoglobin: 14.2 g/dL (ref 13.0–17.0)
Immature Granulocytes: 0 %
Lymphocytes Relative: 8 %
Lymphs Abs: 0.8 10*3/uL (ref 0.7–4.0)
MCH: 31.8 pg (ref 26.0–34.0)
MCHC: 34.1 g/dL (ref 30.0–36.0)
MCV: 93.3 fL (ref 80.0–100.0)
Monocytes Absolute: 1 10*3/uL (ref 0.1–1.0)
Monocytes Relative: 10 %
Neutro Abs: 8.6 10*3/uL — ABNORMAL HIGH (ref 1.7–7.7)
Neutrophils Relative %: 80 %
Platelets: 175 10*3/uL (ref 150–400)
RBC: 4.47 MIL/uL (ref 4.22–5.81)
RDW: 13.1 % (ref 11.5–15.5)
WBC: 10.7 10*3/uL — ABNORMAL HIGH (ref 4.0–10.5)
nRBC: 0 % (ref 0.0–0.2)

## 2018-04-28 LAB — COMPREHENSIVE METABOLIC PANEL
ALT: 17 U/L (ref 0–44)
AST: 25 U/L (ref 15–41)
Albumin: 4.3 g/dL (ref 3.5–5.0)
Alkaline Phosphatase: 78 U/L (ref 38–126)
Anion gap: 11 (ref 5–15)
BUN: 22 mg/dL (ref 8–23)
CO2: 25 mmol/L (ref 22–32)
Calcium: 9.2 mg/dL (ref 8.9–10.3)
Chloride: 104 mmol/L (ref 98–111)
Creatinine, Ser: 1.21 mg/dL (ref 0.61–1.24)
GFR calc Af Amer: >60 mL/min (ref >60)
GFR calc non Af Amer: >60 mL/min (ref >60)
Glucose, Bld: 118 mg/dL — ABNORMAL HIGH (ref 70–99)
Potassium: 4.6 mmol/L (ref 3.5–5.1)
Sodium: 140 mmol/L (ref 135–145)
Total Bilirubin: 1.2 mg/dL (ref 0.3–1.2)
Total Protein: 8.3 g/dL — ABNORMAL HIGH (ref 6.5–8.1)

## 2018-04-28 LAB — LIPASE, BLOOD: Lipase: 28 U/L (ref 11–51)

## 2018-04-28 MED ORDER — TRAMADOL HCL 50 MG PO TABS: 50.0000 mg | ORAL_TABLET | Freq: Four times a day (QID) | ORAL | 0 refills | Status: DC | PRN

## 2018-04-28 MED ORDER — AMOXICILLIN-POT CLAVULANATE ER 1000-62.5 MG PO TB12: 2.0000 | ORAL_TABLET | Freq: Two times a day (BID) | ORAL | 0 refills | Status: DC

## 2018-04-28 MED ORDER — AZITHROMYCIN 250 MG PO TABS: 250.0000 mg | ORAL_TABLET | Freq: Every day | ORAL | 0 refills | Status: DC

## 2018-04-28 MED ORDER — BENZONATATE 200 MG PO CAPS: ORAL_CAPSULE | ORAL | 0 refills | Status: DC

## 2018-04-28 NOTE — Discharge Instructions (Signed)
If you worsen go to the emergency room.  Sure to follow-up with Dr. Ronnald Ramp in 3 to 4 weeks for a follow-up x-ray for proof of cure.  Notify your urologist of the findings of your CAT scan performed today

## 2018-04-28 NOTE — ED Triage Notes (Signed)
Patient c/o constant right upper side pain that started yesterday. Patient denies nausea, vomiting and diarrhea.

## 2018-04-28 NOTE — ED Provider Notes (Signed)
[facility]    CSN: [id number] Arrival date & time: 04/28/18  1455     History   Chief Complaint Chief Complaint  Patient presents with  . Flank Pain    HPI Juan [name] is a 67 y.o. male.   HPI  -year-old male presents with the sudden onset this morning of constant right upper flank and abdominal pain.  He states that he rolled over in bed with the pain onset.  He states that it hurts  between a 4 and a 5 and usually about a 2.  He denies any nausea vomiting or diarrhea.  He has a multiple morbidity history including pprostate cancer.  He has had a negative PET scan in October with no mets seen.  In addition he has had atrophic kidney on the left that is nonfunctional has had kidney stones.  He has type 2 diabetes.  Is the pain is worse when he is recumbent.  He feels it mostly in his right side and at the rib cage margin .  He states that he has had a mild cough recently.  He had a very significant Rigor type chills this morning.  He does have a low-grade fever of 99.9.  The pain is worse with deep inspiration coughing or sneezing.  Certain motions does seem to precipitate it.  Is most uncomfortable with recumbency as mentioned above    Past Medical History:  Diagnosis Date  . Arthritis 08/28/2016  . Atrophic kidney 01/13/2013   Last Assessment & Plan:  Atrophic and nonfunctional LEFT kidney.  Discussed at length today. Asymptomatic. No intervention needed.  Overview:  Last Assessment & Plan:  Atrophic and nonfunctional LEFT kidney.  Discussed at length today. Asymptomatic. No intervention needed. Last Assessment & Plan:  Atrophic and nonfunctional LEFT kidney.  Discussed at length today. Asymptomatic. No intervention needed.  . Cancer ([place])   . COPD (chronic obstructive pulmonary disease) ([place]) 08/28/2016  . Diabetes mellitus type 2, uncomplicated ([place]) 6/70/1410  . ED (erectile dysfunction) 08/28/2016  . GERD (gastroesophageal reflux disease)   . Gross hematuria  07/22/2016   Overview:  Last Assessment & Plan:  The differential diagnosis for hematuria was reviewed.  Possible etiologies include, but are not limited to urolithiasis, infection, urothelial or renal malignancies, medical renal disease, and benign idiopathic hematuria.  The workup was reviewed and he will need a urinary cytology testing, a CT urogram, and Cystourethroscopy.  He has moved to [place], [place] and would like to have all this done with a local urologist since it is an hour and a half drive. Will send records there and have the work performed locally.   > 15 minutes were spent in face to face contact with the patient, >50% of which was spent counseling about about the diagnosis and plan.  Last Assessment & Plan:  The differential diagnosis for hematuria was reviewed.  Possible etiologies include, but are not limited to urolithiasis, infection, urothelial or renal malignancies, medical renal disease, and benign idiopathic hematuria.  The workup was reviewed and he will need a urinary cytology testing,   . H/O vagotomy 10/11/2012  . Hyperlipemia   . Hypertension 08/28/2016  . Kidney stones 08/28/2016   Last Assessment & Plan:  Can't see stone on KUB today again  Pt instructed to call or go to ER for acute flank pain since he only has one functioning kidney.  Currently no evidence of any obstructive uropathy.  Check chem 8 and call with results.  F/u  one year for KUB  . Knee contusion 10/20/2013  . Peptic ulcer 08/28/2016  . Prostate cancer ([place])   . Sarcoidosis 04/04/2015  . Status post partial gastrectomy 10/11/2012    Patient Active Problem List   Diagnosis Date Noted  . Acetonemia due to secondary diabetes ([place]) 09/29/2017  . Gastric hypersecretion 09/29/2017  . Microalbuminuria 03/31/2017  . B12 deficiency 03/01/2017  . Prostate cancer ([place]) 08/28/2016  . Obesity (BMI 30-39.9) 08/28/2016  . Kidney stones, calcium oxalate 08/28/2016  . ED (erectile dysfunction) 08/28/2016  . History  of duodenal ulcer 08/28/2016  . Type 2 diabetes, controlled, with neuropathy ([place]) 08/28/2016  . History of cholelithiasis 08/28/2016  . Arthritis 08/28/2016  . Gastroesophageal reflux disease 08/28/2016  . Hyperlipidemia, unspecified 04/04/2015  . Hyperlipidemia 04/04/2015  . Vitamin D deficiency, unspecified 07/12/2013  . Vitamin D deficiency 07/12/2013  . Atrophic kidney 01/13/2013  . Status post partial gastrectomy 10/11/2012  . H/O vagotomy 10/11/2012    Past Surgical History:  Procedure Laterality Date  . APPENDECTOMY    . CHOLECYSTECTOMY    . GASTRECTOMY    . INSERTION PROSTATE RADIATION SEED  2002   Brachytherapy  . nasal tumor         Home Medications    Prior to Admission medications   Medication Sig Start Date End Date Taking? Authorizing Provider  acetaminophen (TYLENOL) 500 MG tablet Take 1,000 mg by mouth 2 (two) times daily as needed for pain.   Yes [provider]  bicalutamide (CASODEX) 50 MG tablet Dr Gwenlyn Found 07/30/16  Yes [provider]  Cholecalciferol (GNP VITAMIN D MAXIMUM STRENGTH) 2000 units TABS Take 1 tablet (2,000 Units total) by mouth daily. 03/02/17  Yes Plonk, Gwyndolyn Saxon, MD  Cyanocobalamin (B-12) 500 MCG TABS Take 1 tablet by mouth daily. 03/02/17  Yes Plonk, Gwyndolyn Saxon, MD  dutasteride (AVODART) 0.5 MG capsule Take 0.5 mg by mouth daily. Dr Gwenlyn Found   Yes [provider]  leuprolide (LUPRON DEPOT, 25-MONTH,) 22.5 MG injection Inject into the muscle. Dr Gwenlyn Found- cancer center   Yes [provider]  lisinopril (PRINIVIL,ZESTRIL) 5 MG tablet Take 1 tablet (5 mg total) by mouth daily. 09/29/17  Yes Juline Patch, MD  Multiple Vitamin (MULTI-VITAMINS) TABS Take by mouth.   Yes [provider]  pantoprazole (PROTONIX) 40 MG tablet Take 1 tablet (40 mg total) by mouth 2 (two) times daily. 09/29/17  Yes Juline Patch, MD  amoxicillin-clavulanate (AUGMENTIN XR) 1000-62.5 MG 12 hr tablet Take 2 tablets by mouth 2 (two)  times daily. 04/28/18   Lorin Picket, PA-C  azithromycin (ZITHROMAX) 250 MG tablet Take 1 tablet (250 mg total) by mouth daily. Take first 2 tablets together, then 1 every day until finished. 04/28/18   Lorin Picket, PA-C  benzonatate (TESSALON) 200 MG capsule Take one cap TID PRN cough 04/28/18   Lorin Picket, PA-C  traMADol (ULTRAM) 50 MG tablet Take 1 tablet (50 mg total) by mouth every 6 (six) hours as needed. 04/28/18   Lorin Picket, PA-C    Family History Family History  Problem Relation Age of Onset  . Cancer Mother        lung  . Diabetes Father   . Congestive Heart Failure Father   . Prostate cancer Neg Hx   . Kidney cancer Neg Hx     Social History Social History   Tobacco Use  . Smoking status: Former Smoker    Packs/day: 2.00    Years: 35.00  Pack years: 70.00    Types: Cigarettes    Last attempt to quit: 1998    Years since quitting: 21.9  . Smokeless tobacco: Never Used  . Tobacco comment: smoking cessation materials not required  Substance Use Topics  . Alcohol use: Yes    Comment: 1 beer every other week  . Drug use: No     Allergies   Nsaids and Statins   Review of Systems Review of Systems  Constitutional: Positive for activity change, chills and fever. Negative for appetite change.  Gastrointestinal: Positive for abdominal pain. Negative for diarrhea, nausea and vomiting.  Genitourinary: Positive for flank pain. Negative for hematuria, penile pain, penile swelling, scrotal swelling and testicular pain.  All other systems reviewed and are negative.    Physical Exam Triage Vital Signs ED Triage Vitals  Enc Vitals Group     BP 04/28/18 1541 140/70     Pulse Rate 04/28/18 1541 66     Resp 04/28/18 1541 18     Temp 04/28/18 1541 99.9 F (37.7 C)     Temp Source 04/28/18 1541 Oral     SpO2 04/28/18 1541 97 %     Weight 04/28/18 1539 210 lb (95.3 kg)     Height 04/28/18 1539 5\' 8"  (1.727 m)     Head Circumference --       Peak Flow --      Pain Score 04/28/18 1539 4     Pain Loc --      Pain Edu? --      Excl. in [place]? --    No data found.  Updated Vital Signs BP 140/70   Pulse 66   Temp 99.9 F (37.7 C) (Oral)   Resp 18   Ht 5\' 8"  (1.727 m)   Wt 210 lb (95.3 kg)   SpO2 97%   BMI 31.93 kg/m   Visual Acuity Right Eye Distance:   Left Eye Distance:   Bilateral Distance:    Right Eye Near:   Left Eye Near:    Bilateral Near:     Physical Exam Vitals signs and nursing note reviewed.  Constitutional:      General: He is not in acute distress.    Appearance: Normal appearance. He is ill-appearing. He is not toxic-appearing or diaphoretic.  HENT:     Head: Normocephalic.     Right Ear: Tympanic membrane and ear canal normal.     Left Ear: Tympanic membrane and ear canal normal.     Nose: Nose normal.     Mouth/Throat:     Mouth: Mucous membranes are moist.     Pharynx: No oropharyngeal exudate or posterior oropharyngeal erythema.  Eyes:     General:        Right eye: No discharge.        Left eye: No discharge.     Conjunctiva/sclera: Conjunctivae normal.     Pupils: Pupils are equal, round, and reactive to light.  Neck:     Musculoskeletal: Normal range of motion and neck supple.  Pulmonary:     Effort: Pulmonary effort is normal.     Breath sounds: Normal breath sounds.  Abdominal:     General: Abdomen is flat.     Tenderness: There is abdominal tenderness. There is guarding.  Musculoskeletal: Normal range of motion.  Skin:    General: Skin is warm and dry.  Neurological:     General: No focal deficit present.     Mental Status: He  is alert and oriented to person, place, and time.  Psychiatric:        Mood and Affect: Mood normal.        Behavior: Behavior normal.        Thought Content: Thought content normal.        Judgment: Judgment normal.      UC Treatments / Results  Labs (all labs ordered are listed, but only abnormal results are displayed) Labs Reviewed    URINALYSIS, COMPLETE (UACMP) WITH MICROSCOPIC - Abnormal; Notable for the following components:      Result Value   Hgb urine dipstick TRACE (*)    Protein, ur 30 (*)    Leukocytes, UA TRACE (*)    All other components within normal limits  COMPREHENSIVE METABOLIC PANEL - Abnormal; Notable for the following components:   Glucose, Bld 118 (*)    Total Protein 8.3 (*)    All other components within normal limits  CBC WITH DIFFERENTIAL/PLATELET - Abnormal; Notable for the following components:   WBC 10.7 (*)    All other components within normal limits  LIPASE, BLOOD    EKG None  Radiology Dg Chest 2 View  Result Date: 04/28/2018 CLINICAL DATA:  Right upper side chest pain starting yesterday. History of prostate cancer. Cough. EXAM: CHEST - 2 VIEW COMPARISON:  None. FINDINGS: No pneumothorax. The heart, hila, and mediastinum are normal. Minimal atelectasis in the left base. There is opacity in the lateral right base which is somewhat rounded in appearance. Probable tiny right pleural effusion. No other acute abnormalities. IMPRESSION: There is opacity in the lateral right lung base, somewhat rounded in appearance, particularly on the lateral view. Given the relatively chronic symptoms for several weeks and a history of prostate cancer, recommend a CT of the chest for further evaluation. These results will be called to the ordering clinician or representative by the Radiologist Assistant, and communication documented in the PACS or zVision Dashboard. Electronically Signed   By: Dorise Bullion III M.D   On: 04/28/2018 16:59   Ct Chest Wo Contrast  Result Date: 04/28/2018 CLINICAL DATA:  Evaluate abnormal chest x-ray. Cough for several weeks. History of prostate cancer. EXAM: CT CHEST WITHOUT CONTRAST TECHNIQUE: Multidetector CT imaging of the chest was performed following the standard protocol without IV contrast. COMPARISON:  Chest x-ray 04/28/2018 FINDINGS: Cardiovascular: The heart is  normal in size. No pericardial effusion. There is mild tortuosity, ectasia and moderate calcification of the thoracic aorta. Coronary artery calcifications are also noted. Mediastinum/Nodes: Borderline enlarged mediastinal lymph nodes. 9.5 mm prevascular node on image number 62. 10 mm right paratracheal node on image number 51. 12 mm subcarinal lymph node on image number 68. The esophagus is grossly normal. Surgical changes noted at the distal esophagus/GE junction. Lungs/Pleura: Peripheral airspace process in the lateral segment of the right middle lobe most consistent with pneumonia. No worrisome pulmonary lesions are identified. No endobronchial lesions. Mild emphysematous changes. No pleural effusion. Minimal bibasilar atelectasis. Upper Abdomen: No significant upper abdominal findings. There are surgical changes from gastric bypass surgery and cholecystectomy. Chronically small left kidney likely due to renal artery stenosis. Musculoskeletal: No significant bony findings. No supraclavicular or axillary lymphadenopathy. There are 2 left thyroid nodules. The largest measures 15.5 mm. Recommend thyroid ultrasound examination for further evaluation and surveillance. IMPRESSION: 1. Right middle lobe pneumonia. Followup PA and lateral chest X-ray is recommended in 3-4 weeks following trial of antibiotic therapy to ensure resolution. 2. No worrisome pulmonary lesions or endobronchial  lesions. 3. Borderline enlarged mediastinal lymph nodes may be reactive/hyperplastic. The lower subcarinal nodes appears stable on a prior abdominal CT scan from 2018. 4. Mild emphysematous changes and moderate atherosclerotic calcifications involving the aorta and coronary arteries. 5. Two left-sided thyroid lesions. Recommend follow-up with thyroid ultrasound examination. 6. changes noted in the upper abdomen as above. 7. Chronic left renal atrophy. Aortic Atherosclerosis (ICD10-I70.0) and Emphysema (ICD10-J43.9). Electronically Signed    By: Marijo Sanes M.D.   On: 04/28/2018 18:07    Procedures Procedures (including critical care time)  Medications Ordered in UC Medications - No data to display  Initial Impression / Assessment and Plan / UC Course  I have reviewed the triage vital signs and the nursing notes.  Pertinent labs & imaging results that were available during my care of the patient were reviewed by me and considered in my medical decision making (see chart for details).   Patient has a right middle lobe pneumonia that is likely the cause of his chest pain and rib pain.  Treat him with Augmentin 2 g twice daily for 7 days along with Z-Pak.  Also given him prescription for tramadol because of the pain.  He has an allergy to NSAIDs.  Provide him with Ladona Ridgel for cough.  He understands he will need to follow-up in 3 to 4 weeks with his primary care physician for a follow-up chest ray x-ray for proof of cure.  Given a CD of the CAT scan for his other providers as necessary.  Worsens he will go to the emergency room.   Final Clinical Impressions(s) / UC Diagnoses   Final diagnoses:  Community acquired pneumonia of right middle lobe of lung [facility])     Discharge Instructions     If you worsen go to the emergency room.  Sure to follow-up with Dr. Ronnald Ramp in 3 to 4 weeks for a follow-up x-ray for proof of cure.  Notify your urologist of the findings of your CAT scan performed today    ED Prescriptions    Medication Sig Dispense Auth. Provider   amoxicillin-clavulanate (AUGMENTIN XR) 1000-62.5 MG 12 hr tablet Take 2 tablets by mouth 2 (two) times daily. 28 tablet Crecencio Mc P, PA-C   azithromycin (ZITHROMAX) 250 MG tablet Take 1 tablet (250 mg total) by mouth daily. Take first 2 tablets together, then 1 every day until finished. 6 tablet Crecencio Mc P, PA-C   traMADol (ULTRAM) 50 MG tablet Take 1 tablet (50 mg total) by mouth every 6 (six) hours as needed. 15 tablet Bearl, [place], PA-C    benzonatate (TESSALON) 200 MG capsule Take one cap TID PRN cough 30 capsule Lorin Picket, PA-C     Controlled Substance Prescriptions [place] Controlled Substance Registry consulted? Not Applicable   Mohamad, [place], PA-C 04/28/18 1857

## 2018-05-09 DIAGNOSIS — E559 Vitamin D deficiency, unspecified: Secondary | ICD-10-CM | POA: Diagnosis not present

## 2018-05-09 DIAGNOSIS — C61 Malignant neoplasm of prostate: Secondary | ICD-10-CM | POA: Diagnosis not present

## 2018-05-11 ENCOUNTER — Ambulatory Visit
Admission: RE | Admit: 2018-05-11 | Discharge: 2018-05-11 | Disposition: A | Discharge status: Home / Self Care | Payer: Medicare Other | Attending: Family Medicine | Admitting: Family Medicine

## 2018-05-11 ENCOUNTER — Ambulatory Visit
Admission: RE | Admit: 2018-05-11 | Discharge: 2018-05-11 | Disposition: A | Discharge status: Home / Self Care | Payer: Medicare Other | Source: Ambulatory Visit | Attending: Family Medicine | Admitting: Family Medicine

## 2018-05-11 ENCOUNTER — Ambulatory Visit (INDEPENDENT_AMBULATORY_CARE_PROVIDER_SITE_OTHER): Payer: Medicare Other | Admitting: Family Medicine

## 2018-05-11 ENCOUNTER — Encounter: Payer: Self-pay | Admitting: Family Medicine

## 2018-05-11 VITALS — BP 120/72 | HR 62 | Temp 100.0°F | Resp 19 | Ht 68.0 in | Wt 212.0 lb

## 2018-05-11 DIAGNOSIS — R0602 Shortness of breath: Secondary | ICD-10-CM | POA: Diagnosis not present

## 2018-05-11 DIAGNOSIS — E041 Nontoxic single thyroid nodule: Secondary | ICD-10-CM

## 2018-05-11 DIAGNOSIS — J181 Lobar pneumonia, unspecified organism: Secondary | ICD-10-CM | POA: Insufficient documentation

## 2018-05-11 DIAGNOSIS — J189 Pneumonia, unspecified organism: Secondary | ICD-10-CM

## 2018-05-11 DIAGNOSIS — R590 Localized enlarged lymph nodes: Secondary | ICD-10-CM

## 2018-05-11 DIAGNOSIS — R079 Chest pain, unspecified: Secondary | ICD-10-CM | POA: Diagnosis not present

## 2018-05-11 LAB — POCT INFLUENZA A/B
INFLUENZA B, POC: NEGATIVE
Influenza A, POC: NEGATIVE

## 2018-05-11 NOTE — Progress Notes (Signed)
1. Community acquired pneumonia of right middle lobe of lung ([place]) Improved on azithromycin and Augmentin from urgent care visit.  Recurrence in the past 24 hours with dyspnea discomfort in the left anterior lateral chest area there is no palpable tenderness sounds are decreased but this may be due to poor inspiratory effort.  Repeat chest film to rule out pneumothorax x-ray was reviewed with patient peers to be solving his right middle lobe pneumonia.  And had a pale collection and will check a CBC.  It was checked for influenza AB which was negative. - DG Chest 2 View; Future - CBC with Differential/Platelet - POCT Influenza A/B  2. Shortness of breath Shortness of breath by history no pleural friction rub was noted will chest x-ray to rule out pneumothorax. - DG Chest 2 View; Future - POCT Influenza A/B  3. Mediastinal lymphadenopathy And has borderline mediastinal lymphadenopathy noted on CT scan will need to be reevaluated in the near future. - DG Chest 2 View; Future  4. Thyroid nodule Patient was noted to have 2 nodules in the left thyroid lobe.  Referral to endocrinology for evaluation needed. - Ambulatory referral to Endocrinology   Date:  05/11/2018   Name:  Juan [name]   DOB:  1950-07-17   MRN:  [id number]   Chief Complaint: Pneumonia (URGENT CARE FU)  Pneumonia  He complains of chest tightness, difficulty breathing and shortness of breath. There is no cough, frequent throat clearing, hemoptysis, hoarse voice, sputum production or wheezing. This is a new problem. The current episode started yesterday. The problem occurs intermittently. The problem has been waxing and waning. Pertinent negatives include no appetite change, chest pain, dyspnea on exertion, ear congestion, ear pain, fever, headaches, heartburn, malaise/fatigue, myalgias, nasal congestion, orthopnea, PND, postnasal drip, rhinorrhea, sneezing, sore throat, sweats, trouble swallowing or weight loss. His symptoms are  aggravated by URI.    Review of Systems  Constitutional: Negative for appetite change, chills, fever, malaise/fatigue and weight loss.  HENT: Negative for drooling, ear discharge, ear pain, hoarse voice, postnasal drip, rhinorrhea, sneezing, sore throat and trouble swallowing.   Respiratory: Positive for shortness of breath. Negative for cough, hemoptysis, sputum production and wheezing.   Cardiovascular: Negative for chest pain, dyspnea on exertion, palpitations, leg swelling and PND.  Gastrointestinal: Negative for abdominal pain, blood in stool, constipation, diarrhea, heartburn and nausea.  Endocrine: Negative for polydipsia.  Genitourinary: Negative for dysuria, frequency, hematuria and urgency.  Musculoskeletal: Negative for back pain, myalgias and neck pain.  Skin: Negative for rash.  Allergic/Immunologic: Negative for environmental allergies.  Neurological: Negative for dizziness and headaches.  Hematological: Does not bruise/bleed easily.  Psychiatric/Behavioral: Negative for suicidal ideas. The patient is not nervous/anxious.     Patient Active Problem List   Diagnosis Date Noted  . Acetonemia due to secondary diabetes ([place]) 09/29/2017  . Gastric hypersecretion 09/29/2017  . Microalbuminuria 03/31/2017  . B12 deficiency 03/01/2017  . Prostate cancer ([place]) 08/28/2016  . Obesity (BMI 30-39.9) 08/28/2016  . Kidney stones, calcium oxalate 08/28/2016  . ED (erectile dysfunction) 08/28/2016  . History of duodenal ulcer 08/28/2016  . Type 2 diabetes, controlled, with neuropathy ([place]) 08/28/2016  . History of cholelithiasis 08/28/2016  . Arthritis 08/28/2016  . Gastroesophageal reflux disease 08/28/2016  . Hyperlipidemia, unspecified 04/04/2015  . Hyperlipidemia 04/04/2015  . Vitamin D deficiency, unspecified 07/12/2013  . Vitamin D deficiency 07/12/2013  . Atrophic kidney 01/13/2013  . Status post partial gastrectomy 10/11/2012  . H/O vagotomy 10/11/2012    Allergies  Allergen Reactions  . Nsaids Other (See Comments)    Hx of PUD  . Statins Rash    Hyperglycemia    Past Surgical History:  Procedure Laterality Date  . APPENDECTOMY    . CHOLECYSTECTOMY    . GASTRECTOMY    . INSERTION PROSTATE RADIATION SEED  2002   Brachytherapy  . nasal tumor      Social History   Tobacco Use  . Smoking status: Former Smoker    Packs/day: 2.00    Years: 35.00    Pack years: 70.00    Types: Cigarettes    Last attempt to quit: 1998    Years since quitting: 22.0  . Smokeless tobacco: Never Used  . Tobacco comment: smoking cessation materials not required  Substance Use Topics  . Alcohol use: Yes    Comment: 1 beer every other week  . Drug use: No     Medication list has been reviewed and updated.  Current Meds  Medication Sig  . acetaminophen (TYLENOL) 500 MG tablet Take 1,000 mg by mouth 2 (two) times daily as needed for pain.  . Cholecalciferol (GNP VITAMIN D MAXIMUM STRENGTH) 2000 units TABS Take 1 tablet (2,000 Units total) by mouth daily.  . Cyanocobalamin (B-12) 500 MCG TABS Take 1 tablet by mouth daily.  . Darolutamide (NUBEQA) 300 MG TABS Take 600 mg by mouth 2 (two) times daily.  Marland Kitchen dutasteride (AVODART) 0.5 MG capsule Take 0.5 mg by mouth daily. Dr Gwenlyn Found  . leuprolide (LUPRON DEPOT, 82-MONTH,) 22.5 MG injection Inject into the muscle. Dr Gwenlyn Found- cancer center  . lisinopril (PRINIVIL,ZESTRIL) 5 MG tablet Take 1 tablet (5 mg total) by mouth daily.  . Multiple Vitamin (MULTI-VITAMINS) TABS Take by mouth.  . pantoprazole (PROTONIX) 40 MG tablet Take 1 tablet (40 mg total) by mouth 2 (two) times daily.    PHQ 2/9 Scores 09/29/2017 09/20/2017 03/01/2017  PHQ - 2 Score 0 0 0  PHQ- 9 Score 0 0 -    Physical Exam Constitutional:      Appearance: He is overweight. He is ill-appearing. He is not diaphoretic.  HENT:     Head: Normocephalic.     Right Ear: Hearing, tympanic membrane, ear canal and external ear normal.     Left Ear: Hearing,  tympanic membrane, ear canal and external ear normal.     Nose: Nose normal.     Right Turbinates: Swollen.     Left Turbinates: Swollen.     Right Sinus: No maxillary sinus tenderness or frontal sinus tenderness.     Left Sinus: No maxillary sinus tenderness or frontal sinus tenderness.  Eyes:     General: No scleral icterus.       Right eye: No discharge.        Left eye: No discharge.     Conjunctiva/sclera: Conjunctivae normal.     Pupils: Pupils are equal, round, and reactive to light.  Neck:     Musculoskeletal: Full passive range of motion without pain, normal range of motion and neck supple. No edema.     Thyroid: No thyroid mass, thyromegaly or thyroid tenderness.     Vascular: No JVD.     Trachea: No tracheal deviation.  Cardiovascular:     Rate and Rhythm: Normal rate and regular rhythm.     Heart sounds: Normal heart sounds, S1 normal and S2 normal. No murmur. No friction rub. No gallop.   Pulmonary:     Effort: No respiratory distress.  Breath sounds: Decreased air movement present. No transmitted upper airway sounds. Examination of the left-lower field reveals decreased breath sounds. Decreased breath sounds present. No wheezing, rhonchi or rales.  Abdominal:     General: Bowel sounds are normal.     Palpations: Abdomen is soft. There is no mass.     Tenderness: There is no abdominal tenderness. There is no right CVA tenderness, left CVA tenderness, guarding or rebound.  Musculoskeletal: Normal range of motion.        General: No tenderness.  Lymphadenopathy:     Head:     Right side of head: No submandibular adenopathy.     Left side of head: No submandibular adenopathy.     Cervical: No cervical adenopathy.     Right cervical: No superficial or posterior cervical adenopathy.    Left cervical: No superficial or posterior cervical adenopathy.  Skin:    General: Skin is warm.     Coloration: Skin is pale.     Findings: No rash.  Neurological:     Mental  Status: He is alert and oriented to person, place, and time.     Cranial Nerves: No cranial nerve deficit.     Deep Tendon Reflexes: Reflexes are normal and symmetric.     BP 120/72   Pulse 62   Temp 100 F (37.8 C) (Oral)   Resp 19   Ht 5\' 8"  (1.727 m)   Wt 212 lb (96.2 kg)   SpO2 97%   BMI 32.23 kg/m   Assessment and Plan:  1. Community acquired pneumonia of right middle lobe of lung ([place]) Improved on azithromycin and Augmentin from urgent care visit.  Recurrence in the past 24 hours with dyspnea discomfort in the left anterior lateral chest area there is no palpable tenderness sounds are decreased but this may be due to poor inspiratory effort.  Repeat chest film to rule out pneumothorax x-ray was reviewed with patient peers to be solving his right middle lobe pneumonia.  And had a pale collection and will check a CBC.  It was checked for influenza AB which was negative. - DG Chest 2 View; Future - CBC with Differential/Platelet - POCT Influenza A/B  2. Shortness of breath Shortness of breath by history no pleural friction rub was noted will chest x-ray to rule out pneumothorax. - DG Chest 2 View; Future - POCT Influenza A/B  3. Mediastinal lymphadenopathy And has borderline mediastinal lymphadenopathy noted on CT scan will need to be reevaluated in the near future. - DG Chest 2 View; Future   4. Thyroid nodule Patient was noted to have 2 nodules in the left thyroid lobe.  Referral to endocrinology for evaluation needed. - Ambulatory referral to Endocrinology

## 2018-05-12 LAB — CBC WITH DIFFERENTIAL/PLATELET
BASOS: 0 %
Basophils Absolute: 0 10*3/uL (ref 0.0–0.2)
EOS (ABSOLUTE): 0.1 10*3/uL (ref 0.0–0.4)
Eos: 1 %
Hematocrit: 38.3 % (ref 37.5–51.0)
Hemoglobin: 13.3 g/dL (ref 13.0–17.7)
IMMATURE GRANS (ABS): 0 10*3/uL (ref 0.0–0.1)
Immature Granulocytes: 0 %
Lymphocytes Absolute: 1.2 10*3/uL (ref 0.7–3.1)
Lymphs: 15 %
MCH: 31.4 pg (ref 26.6–33.0)
MCHC: 34.7 g/dL (ref 31.5–35.7)
MCV: 91 fL (ref 79–97)
Monocytes Absolute: 0.7 10*3/uL (ref 0.1–0.9)
Monocytes: 8 %
Neutrophils Absolute: 6.1 10*3/uL (ref 1.4–7.0)
Neutrophils: 76 %
Platelets: 444 10*3/uL (ref 150–450)
RBC: 4.23 x10E6/uL (ref 4.14–5.80)
RDW: 12.5 % (ref 11.6–15.4)
WBC: 8.1 10*3/uL (ref 3.4–10.8)

## 2018-05-24 ENCOUNTER — Ambulatory Visit (INDEPENDENT_AMBULATORY_CARE_PROVIDER_SITE_OTHER): Payer: Medicare Other | Admitting: Family Medicine

## 2018-05-24 ENCOUNTER — Encounter: Payer: Self-pay | Admitting: Family Medicine

## 2018-05-24 VITALS — BP 121/81 | HR 68 | Resp 16 | Ht 68.0 in | Wt 209.7 lb

## 2018-05-24 DIAGNOSIS — J181 Lobar pneumonia, unspecified organism: Secondary | ICD-10-CM

## 2018-05-24 DIAGNOSIS — E041 Nontoxic single thyroid nodule: Secondary | ICD-10-CM

## 2018-05-24 DIAGNOSIS — J189 Pneumonia, unspecified organism: Secondary | ICD-10-CM

## 2018-05-24 NOTE — Progress Notes (Signed)
Date:  05/24/2018   Name:  Juan [name]   DOB:  10/01/1950   MRN:  [id number]   Chief Complaint: Pneumonia  Pneumonia  He complains of cough. There is no chest tightness, difficulty breathing, frequent throat clearing, hemoptysis, hoarse voice, shortness of breath, sputum production or wheezing. Primary symptoms comments: Decreased cough. This is a new problem. The current episode started 1 to 4 weeks ago. The problem occurs daily. The problem has been gradually improving. The cough is non-productive. Associated symptoms include malaise/fatigue. Pertinent negatives include no appetite change, chest pain, dyspnea on exertion, ear congestion, ear pain, fever, headaches, myalgias, nasal congestion, orthopnea, PND, postnasal drip, rhinorrhea, sneezing, sore throat, sweats, trouble swallowing or weight loss. His symptoms are aggravated by nothing. Relieved by: antibiotics. He reports moderate improvement on treatment. Risk factors: baseline copd. His past medical history is significant for COPD and emphysema.    Review of Systems  Constitutional: Positive for malaise/fatigue. Negative for appetite change, chills, fever and weight loss.  HENT: Negative for drooling, ear discharge, ear pain, hoarse voice, postnasal drip, rhinorrhea, sneezing, sore throat and trouble swallowing.   Respiratory: Positive for cough. Negative for hemoptysis, sputum production, shortness of breath and wheezing.   Cardiovascular: Negative for chest pain, dyspnea on exertion, palpitations, leg swelling and PND.  Gastrointestinal: Negative for abdominal pain, blood in stool, constipation, diarrhea and nausea.  Endocrine: Negative for polydipsia.  Genitourinary: Negative for dysuria, frequency, hematuria and urgency.  Musculoskeletal: Negative for back pain, myalgias and neck pain.  Skin: Negative for rash.  Allergic/Immunologic: Negative for environmental allergies.  Neurological: Negative for dizziness and headaches.    Hematological: Does not bruise/bleed easily.  Psychiatric/Behavioral: Negative for suicidal ideas. The patient is not nervous/anxious.     Patient Active Problem List   Diagnosis Date Noted  . Acetonemia due to secondary diabetes ([place]) 09/29/2017  . Gastric hypersecretion 09/29/2017  . Microalbuminuria 03/31/2017  . B12 deficiency 03/01/2017  . Prostate cancer ([place]) 08/28/2016  . Obesity (BMI 30-39.9) 08/28/2016  . Kidney stones, calcium oxalate 08/28/2016  . ED (erectile dysfunction) 08/28/2016  . History of duodenal ulcer 08/28/2016  . Type 2 diabetes, controlled, with neuropathy ([place]) 08/28/2016  . History of cholelithiasis 08/28/2016  . Arthritis 08/28/2016  . Gastroesophageal reflux disease 08/28/2016  . Hyperlipidemia, unspecified 04/04/2015  . Hyperlipidemia 04/04/2015  . Vitamin D deficiency, unspecified 07/12/2013  . Vitamin D deficiency 07/12/2013  . Atrophic kidney 01/13/2013  . Status post partial gastrectomy 10/11/2012  . H/O vagotomy 10/11/2012    Allergies  Allergen Reactions  . Nsaids Other (See Comments)    Hx of PUD  . Statins Rash    Hyperglycemia    Past Surgical History:  Procedure Laterality Date  . APPENDECTOMY    . CHOLECYSTECTOMY    . GASTRECTOMY    . INSERTION PROSTATE RADIATION SEED  2002   Brachytherapy  . nasal tumor      Social History   Tobacco Use  . Smoking status: Former Smoker    Packs/day: 2.00    Years: 35.00    Pack years: 70.00    Types: Cigarettes    Last attempt to quit: 1998    Years since quitting: 22.0  . Smokeless tobacco: Never Used  . Tobacco comment: smoking cessation materials not required  Substance Use Topics  . Alcohol use: Yes    Comment: 1 beer every other week  . Drug use: No     Medication list has been reviewed and updated.  Current Meds  Medication Sig  . acetaminophen (TYLENOL) 500 MG tablet Take 1,000 mg by mouth 2 (two) times daily as needed for pain.  . Cholecalciferol (GNP VITAMIN D  MAXIMUM STRENGTH) 2000 units TABS Take 1 tablet (2,000 Units total) by mouth daily.  . Cyanocobalamin (B-12) 500 MCG TABS Take 1 tablet by mouth daily.  . Darolutamide (NUBEQA) 300 MG TABS Take 600 mg by mouth 2 (two) times daily.  Marland Kitchen dutasteride (AVODART) 0.5 MG capsule Take 0.5 mg by mouth daily. Dr Gwenlyn Found  . leuprolide (LUPRON DEPOT, 68-MONTH,) 22.5 MG injection Inject into the muscle. Dr Gwenlyn Found- cancer center  . lisinopril (PRINIVIL,ZESTRIL) 5 MG tablet Take 1 tablet (5 mg total) by mouth daily.  . Multiple Vitamin (MULTI-VITAMINS) TABS Take by mouth.  . pantoprazole (PROTONIX) 40 MG tablet Take 1 tablet (40 mg total) by mouth 2 (two) times daily.    PHQ 2/9 Scores 09/29/2017 09/20/2017 03/01/2017  PHQ - 2 Score 0 0 0  PHQ- 9 Score 0 0 -    Physical Exam Vitals signs and nursing note reviewed.  HENT:     Head: Normocephalic.     Right Ear: External ear normal.     Left Ear: External ear normal.     Nose: Nose normal.  Eyes:     General: No scleral icterus.       Right eye: No discharge.        Left eye: No discharge.     Conjunctiva/sclera: Conjunctivae normal.     Pupils: Pupils are equal, round, and reactive to light.  Neck:     Musculoskeletal: Normal range of motion and neck supple.     Thyroid: No thyromegaly.     Vascular: No JVD.     Trachea: No tracheal deviation.  Cardiovascular:     Rate and Rhythm: Normal rate and regular rhythm.     Heart sounds: Normal heart sounds. No murmur. No friction rub. No gallop.   Pulmonary:     Effort: No respiratory distress.     Breath sounds: Normal breath sounds. No decreased breath sounds, wheezing, rhonchi or rales.  Abdominal:     General: Bowel sounds are normal.     Palpations: Abdomen is soft. There is no mass.     Tenderness: There is no abdominal tenderness. There is no guarding or rebound.  Musculoskeletal: Normal range of motion.        General: No tenderness.  Lymphadenopathy:     Cervical: No cervical adenopathy.    Skin:    General: Skin is warm.     Findings: No rash.  Neurological:     Mental Status: He is alert and oriented to person, place, and time.     Cranial Nerves: No cranial nerve deficit.     Deep Tendon Reflexes: Reflexes are normal and symmetric.     BP 121/81   Pulse 68   Resp 16   Ht 5\' 8"  (1.727 m)   Wt 209 lb 11.2 oz (95.1 kg)   SpO2 96%   BMI 31.88 kg/m   Assessment and Plan:  1. Community acquired pneumonia of right middle lobe of lung ([place]) He is following up for a right middle lobe pneumonia.  Patient is doing well along with fever or cough.  Patient will be followed up with a chest x-ray in 6 to 8 weeks.  Patient has a history of mediastinal lymphadenopathy that is been seen for several years prior.  Again we will repeat chest  x-ray in 6 to 8 weeks for evaluation.  2. Thyroid nodule And has a history of  thyroid nodules that was picked up on chest CT.  Was referred to endocrinology for evaluation and treatment.

## 2018-06-17 ENCOUNTER — Other Ambulatory Visit: Payer: Self-pay | Admitting: Family Medicine

## 2018-06-20 DIAGNOSIS — E559 Vitamin D deficiency, unspecified: Secondary | ICD-10-CM | POA: Diagnosis not present

## 2018-06-20 DIAGNOSIS — Z79818 Long term (current) use of other agents affecting estrogen receptors and estrogen levels: Secondary | ICD-10-CM | POA: Diagnosis not present

## 2018-06-20 DIAGNOSIS — Z87891 Personal history of nicotine dependence: Secondary | ICD-10-CM | POA: Diagnosis not present

## 2018-06-20 DIAGNOSIS — C61 Malignant neoplasm of prostate: Secondary | ICD-10-CM | POA: Diagnosis not present

## 2018-06-29 ENCOUNTER — Encounter: Payer: Self-pay | Admitting: Family Medicine

## 2018-06-29 ENCOUNTER — Ambulatory Visit (INDEPENDENT_AMBULATORY_CARE_PROVIDER_SITE_OTHER): Payer: Medicare Other | Admitting: Family Medicine

## 2018-06-29 ENCOUNTER — Ambulatory Visit
Admission: RE | Admit: 2018-06-29 | Discharge: 2018-06-29 | Disposition: A | Discharge status: Home / Self Care | Payer: Medicare Other | Attending: Family Medicine | Admitting: Family Medicine

## 2018-06-29 ENCOUNTER — Ambulatory Visit
Admission: RE | Admit: 2018-06-29 | Discharge: 2018-06-29 | Disposition: A | Discharge status: Home / Self Care | Payer: Medicare Other | Source: Ambulatory Visit | Attending: Family Medicine | Admitting: Family Medicine

## 2018-06-29 VITALS — BP 138/88 | HR 60 | Ht 68.0 in | Wt 215.0 lb

## 2018-06-29 DIAGNOSIS — Z862 Personal history of diseases of the blood and blood-forming organs and certain disorders involving the immune mechanism: Secondary | ICD-10-CM | POA: Insufficient documentation

## 2018-06-29 DIAGNOSIS — J189 Pneumonia, unspecified organism: Secondary | ICD-10-CM

## 2018-06-29 DIAGNOSIS — R918 Other nonspecific abnormal finding of lung field: Secondary | ICD-10-CM | POA: Diagnosis not present

## 2018-06-29 DIAGNOSIS — I700 Atherosclerosis of aorta: Secondary | ICD-10-CM

## 2018-06-29 DIAGNOSIS — J181 Lobar pneumonia, unspecified organism: Secondary | ICD-10-CM

## 2018-06-29 NOTE — Progress Notes (Signed)
Date:  06/29/2018   Name:  Juan [name]   DOB:  16-Dec-1950   MRN:  [id number]   Chief Complaint: Follow-up (pneumonia- needed chest xray)  Pneumonia  He complains of cough. There is no chest tightness, difficulty breathing, frequent throat clearing, hemoptysis, hoarse voice, shortness of breath, sputum production or wheezing. This is a recurrent problem. The current episode started more than 1 month ago (10 weeks). The problem occurs intermittently. The problem has been gradually improving. The cough is non-productive. Associated symptoms include postnasal drip. Pertinent negatives include no appetite change, chest pain, dyspnea on exertion, ear congestion, ear pain, fever, headaches, heartburn, malaise/fatigue, myalgias, nasal congestion, orthopnea, PND, rhinorrhea, sneezing, sore throat, sweats, trouble swallowing or weight loss. Relieved by: recent antibiotic treatment.    Review of Systems  Constitutional: Negative for appetite change, chills, fever, malaise/fatigue and weight loss.  HENT: Positive for postnasal drip. Negative for drooling, ear discharge, ear pain, hoarse voice, rhinorrhea, sneezing, sore throat and trouble swallowing.   Respiratory: Positive for cough. Negative for hemoptysis, sputum production, shortness of breath and wheezing.   Cardiovascular: Negative for chest pain, dyspnea on exertion, palpitations, leg swelling and PND.  Gastrointestinal: Negative for abdominal pain, blood in stool, constipation, diarrhea, heartburn and nausea.  Endocrine: Negative for polydipsia.  Genitourinary: Negative for dysuria, frequency, hematuria and urgency.  Musculoskeletal: Negative for back pain, myalgias and neck pain.  Skin: Negative for rash.  Allergic/Immunologic: Negative for environmental allergies.  Neurological: Negative for dizziness and headaches.  Hematological: Does not bruise/bleed easily.  Psychiatric/Behavioral: Negative for suicidal ideas. The patient is not  nervous/anxious.     Patient Active Problem List   Diagnosis Date Noted  . Thyroid nodule 05/24/2018  . Acetonemia due to secondary diabetes ([place]) 09/29/2017  . Gastric hypersecretion 09/29/2017  . Microalbuminuria 03/31/2017  . B12 deficiency 03/01/2017  . Prostate cancer ([place]) 08/28/2016  . Obesity (BMI 30-39.9) 08/28/2016  . Kidney stones, calcium oxalate 08/28/2016  . ED (erectile dysfunction) 08/28/2016  . History of duodenal ulcer 08/28/2016  . Type 2 diabetes, controlled, with neuropathy ([place]) 08/28/2016  . History of cholelithiasis 08/28/2016  . Arthritis 08/28/2016  . Gastroesophageal reflux disease 08/28/2016  . Hyperlipidemia, unspecified 04/04/2015  . Hyperlipidemia 04/04/2015  . Vitamin D deficiency, unspecified 07/12/2013  . Vitamin D deficiency 07/12/2013  . Atrophic kidney 01/13/2013  . Status post partial gastrectomy 10/11/2012  . H/O vagotomy 10/11/2012    Allergies  Allergen Reactions  . Nsaids Other (See Comments)    Hx of PUD  . Statins Rash    Hyperglycemia    Past Surgical History:  Procedure Laterality Date  . APPENDECTOMY    . CHOLECYSTECTOMY    . GASTRECTOMY    . INSERTION PROSTATE RADIATION SEED  2002   Brachytherapy  . nasal tumor      Social History   Tobacco Use  . Smoking status: Former Smoker    Packs/day: 2.00    Years: 35.00    Pack years: 70.00    Types: Cigarettes    Last attempt to quit: 1998    Years since quitting: 22.1  . Smokeless tobacco: Never Used  . Tobacco comment: smoking cessation materials not required  Substance Use Topics  . Alcohol use: Yes    Comment: 1 beer every other week  . Drug use: No     Medication list has been reviewed and updated.  Current Meds  Medication Sig  . acetaminophen (TYLENOL) 500 MG tablet Take 1,000 mg by  mouth 2 (two) times daily as needed for pain.  . Cholecalciferol (GNP VITAMIN D MAXIMUM STRENGTH) 2000 units TABS Take 1 tablet (2,000 Units total) by mouth daily.  .  Cyanocobalamin (B-12) 500 MCG TABS Take 1 tablet by mouth daily.  . Darolutamide (NUBEQA) 300 MG TABS Take 600 mg by mouth 2 (two) times daily.  Marland Kitchen dutasteride (AVODART) 0.5 MG capsule Take 0.5 mg by mouth daily. Dr Gwenlyn Found  . leuprolide (LUPRON DEPOT, 9-MONTH,) 22.5 MG injection Inject into the muscle. Dr Gwenlyn Found- cancer center  . lisinopril (PRINIVIL,ZESTRIL) 5 MG tablet Take 1 tablet (5 mg total) by mouth daily.  . Multiple Vitamin (MULTI-VITAMINS) TABS Take by mouth.  . pantoprazole (PROTONIX) 40 MG tablet TAKE 1 TABLET BY MOUTH TWICE A DAY    PHQ 2/9 Scores 09/29/2017 09/20/2017 03/01/2017  PHQ - 2 Score 0 0 0  PHQ- 9 Score 0 0 -    Physical Exam Vitals signs and nursing note reviewed.  HENT:     Head: Normocephalic.     Right Ear: External ear normal.     Left Ear: External ear normal.     Nose: Nose normal. No congestion or rhinorrhea.  Eyes:     General: No scleral icterus.       Right eye: No discharge.        Left eye: No discharge.     Conjunctiva/sclera: Conjunctivae normal.     Pupils: Pupils are equal, round, and reactive to light.  Neck:     Musculoskeletal: Normal range of motion and neck supple.     Thyroid: No thyromegaly.     Vascular: No JVD.     Trachea: No tracheal deviation.  Cardiovascular:     Rate and Rhythm: Normal rate and regular rhythm.     Pulses: Normal pulses.          Carotid pulses are 2+ on the right side and 2+ on the left side.      Radial pulses are 2+ on the right side and 2+ on the left side.       Femoral pulses are 2+ on the right side and 2+ on the left side.      Popliteal pulses are 2+ on the right side and 2+ on the left side.       Dorsalis pedis pulses are 2+ on the right side and 2+ on the left side.       Posterior tibial pulses are 2+ on the right side and 2+ on the left side.     Heart sounds: Normal heart sounds, S1 normal and S2 normal. No murmur. No systolic murmur. No diastolic murmur. No friction rub. No gallop. No S3 or S4  sounds.   Pulmonary:     Effort: Pulmonary effort is normal. No respiratory distress.     Breath sounds: Normal breath sounds. No stridor, decreased air movement or transmitted upper airway sounds. No decreased breath sounds, wheezing, rhonchi or rales.  Abdominal:     General: Bowel sounds are normal.     Palpations: Abdomen is soft. There is no mass.     Tenderness: There is no abdominal tenderness. There is no guarding or rebound.  Musculoskeletal: Normal range of motion.        General: No tenderness.  Lymphadenopathy:     Cervical: No cervical adenopathy.  Skin:    General: Skin is warm.     Findings: No rash.  Neurological:     Mental Status: He is  alert and oriented to person, place, and time.     Cranial Nerves: No cranial nerve deficit.     Deep Tendon Reflexes: Reflexes are normal and symmetric.     BP 138/88   Pulse 60   Ht 5\' 8"  (1.727 m)   Wt 215 lb (97.5 kg)   SpO2 98%   BMI 32.69 kg/m   Assessment and Plan:  1. Pneumonia of right middle lobe due to infectious organism [facility]) Patient had a right middle lobe pneumonia which was persistent on evidence of last chest x-ray after treatment.  This is a further evaluation with chest x-ray to see if there is total clearing.  There is no decrease in breath sounds on auscultation today. - DG Chest 2 View; Future  2. History of sarcoidosis She has a history of borderline enlarged lymph nodes in the chest region there is some mention of sarcoid and a past medical history but this is been marked as resolved so patient has a history of possible sarcoid for which may explain his lymphadenopathy. - DG Chest 2 View; Future  3. Aortic atherosclerosis (HCC) On for last CT of the chest aortic atherosclerosis was noted of the aorta.  It was instructed of this and will hold on aspirin at this time

## 2018-08-23 DIAGNOSIS — E041 Nontoxic single thyroid nodule: Secondary | ICD-10-CM | POA: Diagnosis not present

## 2018-08-29 DIAGNOSIS — E042 Nontoxic multinodular goiter: Secondary | ICD-10-CM | POA: Insufficient documentation

## 2018-09-09 ENCOUNTER — Other Ambulatory Visit: Payer: Self-pay | Admitting: Family Medicine

## 2018-09-14 DIAGNOSIS — Z08 Encounter for follow-up examination after completed treatment for malignant neoplasm: Secondary | ICD-10-CM | POA: Diagnosis not present

## 2018-09-14 DIAGNOSIS — E559 Vitamin D deficiency, unspecified: Secondary | ICD-10-CM | POA: Diagnosis not present

## 2018-09-14 DIAGNOSIS — R232 Flushing: Secondary | ICD-10-CM | POA: Diagnosis not present

## 2018-09-14 DIAGNOSIS — E042 Nontoxic multinodular goiter: Secondary | ICD-10-CM | POA: Diagnosis not present

## 2018-09-14 DIAGNOSIS — C61 Malignant neoplasm of prostate: Secondary | ICD-10-CM | POA: Diagnosis not present

## 2018-09-14 DIAGNOSIS — Z8546 Personal history of malignant neoplasm of prostate: Secondary | ICD-10-CM | POA: Diagnosis not present

## 2018-09-14 DIAGNOSIS — Z87891 Personal history of nicotine dependence: Secondary | ICD-10-CM | POA: Diagnosis not present

## 2018-09-14 DIAGNOSIS — R9721 Rising PSA following treatment for malignant neoplasm of prostate: Secondary | ICD-10-CM | POA: Diagnosis not present

## 2018-09-14 DIAGNOSIS — Z79818 Long term (current) use of other agents affecting estrogen receptors and estrogen levels: Secondary | ICD-10-CM | POA: Diagnosis not present

## 2018-09-14 DIAGNOSIS — Z79899 Other long term (current) drug therapy: Secondary | ICD-10-CM | POA: Diagnosis not present

## 2018-09-19 ENCOUNTER — Other Ambulatory Visit: Payer: Self-pay | Admitting: Family Medicine

## 2018-09-19 DIAGNOSIS — J189 Pneumonia, unspecified organism: Secondary | ICD-10-CM

## 2018-09-21 ENCOUNTER — Ambulatory Visit
Admission: RE | Admit: 2018-09-21 | Discharge: 2018-09-21 | Disposition: A | Discharge status: Home / Self Care | Payer: Medicare Other | Source: Ambulatory Visit | Attending: Family Medicine | Admitting: Family Medicine

## 2018-09-21 ENCOUNTER — Other Ambulatory Visit: Payer: Self-pay

## 2018-09-21 ENCOUNTER — Ambulatory Visit (INDEPENDENT_AMBULATORY_CARE_PROVIDER_SITE_OTHER): Payer: Medicare Other

## 2018-09-21 ENCOUNTER — Ambulatory Visit
Admission: RE | Admit: 2018-09-21 | Discharge: 2018-09-21 | Disposition: A | Discharge status: Home / Self Care | Payer: Medicare Other | Attending: Family Medicine | Admitting: Family Medicine

## 2018-09-21 VITALS — BP 124/76 | HR 62 | Temp 98.1°F | Resp 16 | Ht 68.0 in | Wt 221.6 lb

## 2018-09-21 DIAGNOSIS — Z0000 Encounter for general adult medical examination without abnormal findings: Secondary | ICD-10-CM | POA: Diagnosis not present

## 2018-09-21 DIAGNOSIS — J181 Lobar pneumonia, unspecified organism: Secondary | ICD-10-CM | POA: Insufficient documentation

## 2018-09-21 DIAGNOSIS — J189 Pneumonia, unspecified organism: Secondary | ICD-10-CM

## 2018-09-21 NOTE — Progress Notes (Signed)
Subjective:   Declan Mier is a 68 y.o. male who presents for Medicare Annual/Subsequent preventive examination.  Review of Systems:   Cardiac Risk Factors include: advanced age (>53men, >62 women);male gender;obesity (BMI >30kg/m2);hypertension     Objective:    Vitals: BP 124/76 (BP Location: Left Arm, Patient Position: Sitting, Cuff Size: Large)    Pulse 62    Temp 98.1 F (36.7 C) (Oral)    Resp 16    Ht 5\' 8"  (1.727 m)    Wt 221 lb 9.6 oz (100.5 kg)    SpO2 98%    BMI 33.69 kg/m   Body mass index is 33.69 kg/m.  Advanced Directives 09/21/2018 04/28/2018 09/20/2017 03/01/2017  Does Patient Have a Medical Advance Directive? No No No No  Would patient like information on creating a medical advance directive? Yes (MAU/Ambulatory/Procedural Areas - Information given) - Yes (MAU/Ambulatory/Procedural Areas - Information given) -    Tobacco Social History   Tobacco Use  Smoking Status Former Smoker   Packs/day: 2.00   Years: 35.00   Pack years: 70.00   Types: Cigarettes   Last attempt to quit: 1998   Years since quitting: 22.3  Smokeless Tobacco Never Used  Tobacco Comment   smoking cessation materials not required     Counseling given: Not Answered Comment: smoking cessation materials not required   Clinical Intake:  Pre-visit preparation completed: Yes  Pain : No/denies pain     BMI - recorded: 33.69 Nutritional Status: BMI > 30  Obese Nutritional Risks: None Diabetes: Yes CBG done?: No Did pt. bring in CBG monitor from home?: No   Nutrition Risk Assessment:  Has the patient had any N/V/D within the last 2 months?  No  Does the patient have any non-healing wounds?  No  Has the patient had any unintentional weight loss or weight gain?  No   Diabetes:  Is the patient diabetic?  Yes  If diabetic, was a CBG obtained today?  No  Did the patient bring in their glucometer from home?  No  How often do you monitor your CBG's? Pt does not check his  blood sugar.   Financial Strains and Diabetes Management:  Are you having any financial strains with the device, your supplies or your medication? No .  Does the patient want to be seen by Chronic Care Management for management of their diabetes?  No  Would the patient like to be referred to a Nutritionist or for Diabetic Management?  No   Diabetic Exams:  Diabetic Eye Exam: Completed 09/30/17.   Diabetic Foot Exam: Completed 09/29/17.    How often do you need to have someone help you when you read instructions, pamphlets, or other written materials from your doctor or pharmacy?: 1 - Never  Interpreter Needed?: No  Information entered by :: Clemetine Marker LPN  Past Medical History:  Diagnosis Date   Arthritis 08/28/2016   Atrophic kidney 01/13/2013   Last Assessment & Plan:  Atrophic and nonfunctional LEFT kidney.  Discussed at length today. Asymptomatic. No intervention needed.  Overview:  Last Assessment & Plan:  Atrophic and nonfunctional LEFT kidney.  Discussed at length today. Asymptomatic. No intervention needed. Last Assessment & Plan:  Atrophic and nonfunctional LEFT kidney.  Discussed at length today. Asymptomatic. No intervention needed.   Cancer [facility])    COPD (chronic obstructive pulmonary disease) ([place]) 08/28/2016   Diabetes mellitus type 2, uncomplicated ([place]) 9/56/3875   ED (erectile dysfunction) 08/28/2016   GERD (gastroesophageal reflux disease)  Gross hematuria 07/22/2016   Overview:  Last Assessment & Plan:  The differential diagnosis for hematuria was reviewed.  Possible etiologies include, but are not limited to urolithiasis, infection, urothelial or renal malignancies, medical renal disease, and benign idiopathic hematuria.  The workup was reviewed and he will need a urinary cytology testing, a CT urogram, and Cystourethroscopy.  He has moved to [place], [place] and would like to have all this done with a local urologist since it is an hour and a half drive. Will  send records there and have the work performed locally.   > 15 minutes were spent in face to face contact with the patient, >50% of which was spent counseling about about the diagnosis and plan.  Last Assessment & Plan:  The differential diagnosis for hematuria was reviewed.  Possible etiologies include, but are not limited to urolithiasis, infection, urothelial or renal malignancies, medical renal disease, and benign idiopathic hematuria.  The workup was reviewed and he will need a urinary cytology testing,    H/O vagotomy 10/11/2012   Hyperlipemia    Hypertension 08/28/2016   Kidney stones 08/28/2016   Last Assessment & Plan:  Can't see stone on KUB today again  Pt instructed to call or go to ER for acute flank pain since he only has one functioning kidney.  Currently no evidence of any obstructive uropathy.  Check chem 8 and call with results.  F/u one year for KUB   Knee contusion 10/20/2013   Peptic ulcer 08/28/2016   Peripheral neuropathy    Prostate cancer ([place])    Sarcoidosis 04/04/2015   Status post partial gastrectomy 10/11/2012   Thyroid nodule    Past Surgical History:  Procedure Laterality Date   APPENDECTOMY     CHOLECYSTECTOMY     EYE SURGERY     bialteral cataract extraction   GASTRECTOMY     INSERTION PROSTATE RADIATION SEED  2002   Brachytherapy   nasal tumor     Family History  Problem Relation Age of Onset   Cancer Mother        lung   Diabetes Father    Congestive Heart Failure Father    Prostate cancer Neg Hx    Kidney cancer Neg Hx    Social History   Socioeconomic History   Marital status: Divorced    Spouse name: Not on file   Number of children: 2   Years of education: Not on file   Highest education level: Bachelor's degree (e.g., BA, AB, BS)  Occupational History    Employer: [employer]  Social Needs   Financial resource strain: Not hard at all   Food insecurity:    Worry: Never true    Inability: Never true    Transportation needs:    Medical: No    Non-medical: No  Tobacco Use   Smoking status: Former Smoker    Packs/day: 2.00    Years: 35.00    Pack years: 70.00    Types: Cigarettes    Last attempt to quit: 1998    Years since quitting: 22.3   Smokeless tobacco: Never Used   Tobacco comment: smoking cessation materials not required  Substance and Sexual Activity   Alcohol use: Yes    Comment: Occasional beer   Drug use: No   Sexual activity: Not Currently    Birth control/protection: None  Lifestyle   Physical activity:    Days per week: 3 days    Minutes per session: 30 min  Stress: Not at all  Relationships   Social connections:    Talks on phone: More than three times a week    Gets together: Twice a week    Attends religious service: Never    Active member of club or organization: Yes    Attends meetings of clubs or organizations: 1 to 4 times per year    Relationship status: Divorced  Other Topics Concern   Not on file  Social History Narrative   Not on file    Outpatient Encounter Medications as of 09/21/2018  Medication Sig   acetaminophen (TYLENOL) 500 MG tablet Take 1,000 mg by mouth 2 (two) times daily as needed for pain.   Cholecalciferol (GNP VITAMIN D MAXIMUM STRENGTH) 2000 units TABS Take 1 tablet (2,000 Units total) by mouth daily.   Cyanocobalamin (B-12) 500 MCG TABS Take 1 tablet by mouth daily.   Darolutamide (NUBEQA) 300 MG TABS Take 600 mg by mouth 2 (two) times daily.   dutasteride (AVODART) 0.5 MG capsule Take 0.5 mg by mouth daily. Dr Gwenlyn Found   leuprolide (LUPRON DEPOT, 35-MONTH,) 22.5 MG injection Inject into the muscle. Dr Gwenlyn Found- cancer center   lisinopril (PRINIVIL,ZESTRIL) 5 MG tablet Take 1 tablet (5 mg total) by mouth daily.   Multiple Vitamin (MULTI-VITAMINS) TABS Take by mouth.   pantoprazole (PROTONIX) 40 MG tablet TAKE 1 TABLET BY MOUTH TWICE A DAY   No facility-administered encounter medications on file as of 09/21/2018.      Activities of Daily Living In your present state of health, do you have any difficulty performing the following activities: 09/21/2018  Hearing? Y  Comment wears hearing aids  Vision? N  Comment reading glasses only  Difficulty concentrating or making decisions? N  Walking or climbing stairs? N  Dressing or bathing? N  Doing errands, shopping? N  Preparing Food and eating ? N  Using the Toilet? N  In the past six months, have you accidently leaked urine? N  Do you have problems with loss of bowel control? N  Managing your Medications? N  Managing your Finances? N  Housekeeping or managing your Housekeeping? N  Some recent data might be hidden    Patient Care Team: Juline Patch, MD as PCP - General (Family Medicine) Elza Rafter, MD as Consulting Physician (Internal Medicine) Hollice Espy, MD as Consulting Physician (Urology)   Assessment:   This is a routine wellness examination for Gwyndolyn Saxon.  Exercise Activities and Dietary recommendations Current Exercise Habits: Home exercise routine, Type of exercise: walking, Time (Minutes): 30, Frequency (Times/Week): 3, Weekly Exercise (Minutes/Week): 90, Intensity: Mild, Exercise limited by: None identified  Goals     DIET - INCREASE WATER INTAKE     Recommend to drink at least 6-8 8oz glasses of water per day.       Fall Risk Fall Risk  09/21/2018 09/20/2017 03/01/2017  Falls in the past year? 0 No No  Number falls in past yr: 0 - -  Injury with Fall? 0 - -  Risk for fall due to : - History of fall(s) -  Risk for fall due to: Comment - slipped on deck -  Follow up Falls prevention discussed - -   Media:  Any stairs in or around the home? Yes  If so, do they handrails? Yes   Home free of loose throw rugs in walkways, pet beds, electrical cords, etc? Yes  Adequate lighting in your home to reduce risk of falls?  Yes   ASSISTIVE DEVICES UTILIZED TO PREVENT FALLS:  Life alert?  No  Use of a cane, walker or w/c? No  Grab bars in the bathroom? Yes  Shower chair or bench in shower? Yes  Elevated toilet seat or a handicapped toilet? No   DME ORDERS:  DME order needed?  No   TIMED UP AND GO:  Was the test performed? Yes .  Length of time to ambulate 10 feet: 6 sec.   GAIT:  Appearance of gait: Gait stead-fast and without the use of an assistive device.   Education: Fall risk prevention has been discussed.  Intervention(s) required? No   Depression Screen PHQ 2/9 Scores 09/21/2018 09/29/2017 09/20/2017 03/01/2017  PHQ - 2 Score 0 0 0 0  PHQ- 9 Score - 0 0 -    Cognitive Function     6CIT Screen 09/21/2018 09/20/2017  What Year? 0 points 0 points  What month? 0 points 0 points  What time? 0 points 0 points  Count back from 20 0 points 0 points  Months in reverse 0 points 0 points  Repeat phrase 0 points 0 points  Total Score 0 0    Immunization History  Administered Date(s) Administered   Influenza, High Dose Seasonal PF 03/01/2017, 03/29/2018   Influenza-Unspecified 03/01/2017   Pneumococcal Conjugate-13 03/01/2017   Pneumococcal Polysaccharide-23 03/21/2009, 04/01/2015   Tdap 10/15/2013   Tetanus 11/09/2006   Zoster 05/04/2008    Qualifies for Shingles Vaccine? Yes  Zostavax completed 2010. Due for Shingrix. Education has been provided regarding the importance of this vaccine. Pt has been advised to call insurance company to determine out of pocket expense. Advised may also receive vaccine at local pharmacy or Health Dept. Verbalized acceptance and understanding.  Tdap: Up to date  Flu Vaccine: Up to date  Pneumococcal Vaccine: Up to date   Screening Tests Health Maintenance  Topic Date Due   HEMOGLOBIN A1C  12/28/2017   FOOT EXAM  09/30/2018   OPHTHALMOLOGY EXAM  10/01/2018   INFLUENZA VACCINE  12/03/2018   TETANUS/TDAP  10/16/2023   COLONOSCOPY  05/04/2025   Hepatitis C Screening  Completed   PNA vac Low Risk  Adult  Completed   Cancer Screenings:  Colorectal Screening: Completed 03/21/10. Repeat every 10 years.  Lung Cancer Screening: (Low Dose CT Chest recommended if Age 70-80 years, 30 pack-year currently smoking OR have quit w/in 15years.) does not qualify.   Additional Screening:  Hepatitis C Screening: does qualify; Completed 06/30/17  Vision Screening: Recommended annual ophthalmology exams for early detection of glaucoma and other disorders of the eye. Is the patient up to date with their annual eye exam?  Yes  Who is the provider or what is the name of the office in which the pt attends annual eye exams? Dr. Dorena Bodo [facility]  Dental Screening: Recommended annual dental exams for proper oral hygiene  Community Resource Referral:  CRR required this visit?  No       Plan:    I have personally reviewed and addressed the Medicare Annual Wellness questionnaire and have noted the following in the patients chart:  A. Medical and social history B. Use of alcohol, tobacco or illicit drugs  C. Current medications and supplements D. Functional ability and status E.  Nutritional status F.  Physical activity G. Advance directives H. List of other physicians I.  Hospitalizations, surgeries, and ER visits in previous 12 months J.  [place] such as hearing and vision if  needed, cognitive and depression L. Referrals and appointments   In addition, I have reviewed and discussed with patient certain preventive protocols, quality metrics, and best practice recommendations. A written personalized care plan for preventive services as well as general preventive health recommendations were provided to patient.   Signed,  Clemetine Marker, LPN Nurse Health Advisor   Nurse Notes: pt doing well and appreciative of visit today. Pt is having chest x-ray today to f/u on pneumonia from earlier in the year. Advised pt to schedule yearly exam with Dr. Ronnald Ramp, due for a1c check.

## 2018-09-21 NOTE — Patient Instructions (Signed)
Juan [name] , Thank you for taking time to come for your Medicare Wellness Visit. I appreciate your ongoing commitment to your health goals. Please review the following plan we discussed and let me know if I can assist you in the future.   Screening recommendations/referrals: Colonoscopy: completed 03/21/10 Recommended yearly ophthalmology/optometry visit for glaucoma screening and checkup Recommended yearly dental visit for hygiene and checkup  Vaccinations: Influenza vaccine: done 03/29/18 Pneumococcal vaccine: done 03/01/17 Tdap vaccine: done 10/15/13 Shingles vaccine: Shingrix discussed. Please contact your pharmacy for coverage information.   Advanced directives: Advance directive discussed with you today. I have provided a copy for you to complete at home and have notarized. Once this is complete please bring a copy in to our office so we can scan it into your chart.  Conditions/risks identified: Keep up the great work!  Next appointment: Please follow up in one year for your Medicare Annual Wellness visit.    Preventive Care 70 Years and Older, Male Preventive care refers to lifestyle choices and visits with your health care provider that can promote health and wellness. What does preventive care include?  A yearly physical exam. This is also called an annual well check.  Dental exams once or twice a year.  Routine eye exams. Ask your health care provider how often you should have your eyes checked.  Personal lifestyle choices, including:  Daily care of your teeth and gums.  Regular physical activity.  Eating a healthy diet.  Avoiding tobacco and drug use.  Limiting alcohol use.  Practicing safe sex.  Taking low doses of aspirin every day.  Taking vitamin and mineral supplements as recommended by your health care provider. What happens during an annual well check? The services and screenings done by your health care provider during your annual well check will  depend on your age, overall health, lifestyle risk factors, and family history of disease. Counseling  Your health care provider may ask you questions about your:  Alcohol use.  Tobacco use.  Drug use.  Emotional well-being.  Home and relationship well-being.  Sexual activity.  Eating habits.  History of falls.  Memory and ability to understand (cognition).  Work and work Statistician. Screening  You may have the following tests or measurements:  Height, weight, and BMI.  Blood pressure.  Lipid and cholesterol levels. These may be checked every 5 years, or more frequently if you are over 61 years old.  Skin check.  Lung cancer screening. You may have this screening every year starting at age 6 if you have a 30-pack-year history of smoking and currently smoke or have quit within the past 15 years.  Fecal occult blood test (FOBT) of the stool. You may have this test every year starting at age 56.  Flexible sigmoidoscopy or colonoscopy. You may have a sigmoidoscopy every 5 years or a colonoscopy every 10 years starting at age 55.  Prostate cancer screening. Recommendations will vary depending on your family history and other risks.  Hepatitis C blood test.  Hepatitis B blood test.  Sexually transmitted disease (STD) testing.  Diabetes screening. This is done by checking your blood sugar (glucose) after you have not eaten for a while (fasting). You may have this done every 1-3 years.  Abdominal aortic aneurysm (AAA) screening. You may need this if you are a current or former smoker.  Osteoporosis. You may be screened starting at age 25 if you are at high risk. Talk with your health care provider about your test  results, treatment options, and if necessary, the need for more tests. Vaccines  Your health care provider may recommend certain vaccines, such as:  Influenza vaccine. This is recommended every year.  Tetanus, diphtheria, and acellular pertussis (Tdap,  Td) vaccine. You may need a Td booster every 10 years.  Zoster vaccine. You may need this after age 70.  Pneumococcal 13-valent conjugate (PCV13) vaccine. One dose is recommended after age 36.  Pneumococcal polysaccharide (PPSV23) vaccine. One dose is recommended after age 68. Talk to your health care provider about which screenings and vaccines you need and how often you need them. This information is not intended to replace advice given to you by your health care provider. Make sure you discuss any questions you have with your health care provider. Document Released: 05/17/2015 Document Revised: 01/08/2016 Document Reviewed: 02/19/2015 Elsevier Interactive Patient Education  2017 [employer] Prevention in the Home Falls can cause injuries. They can happen to people of all ages. There are many things you can do to make your home safe and to help prevent falls. What can I do on the outside of my home?  Regularly fix the edges of walkways and driveways and fix any cracks.  Remove anything that might make you trip as you walk through a door, such as a raised step or threshold.  Trim any bushes or trees on the path to your home.  Use bright outdoor lighting.  Clear any walking paths of anything that might make someone trip, such as rocks or tools.  Regularly check to see if handrails are loose or broken. Make sure that both sides of any steps have handrails.  Any raised decks and porches should have guardrails on the edges.  Have any leaves, snow, or ice cleared regularly.  Use sand or salt on walking paths during winter.  Clean up any spills in your garage right away. This includes oil or grease spills. What can I do in the bathroom?  Use night lights.  Install grab bars by the toilet and in the tub and shower. Do not use towel bars as grab bars.  Use non-skid mats or decals in the tub or shower.  If you need to sit down in the shower, use a plastic, non-slip stool.   Keep the floor dry. Clean up any water that spills on the floor as soon as it happens.  Remove soap buildup in the tub or shower regularly.  Attach bath mats securely with double-sided non-slip rug tape.  Do not have throw rugs and other things on the floor that can make you trip. What can I do in the bedroom?  Use night lights.  Make sure that you have a light by your bed that is easy to reach.  Do not use any sheets or blankets that are too big for your bed. They should not hang down onto the floor.  Have a firm chair that has side arms. You can use this for support while you get dressed.  Do not have throw rugs and other things on the floor that can make you trip. What can I do in the kitchen?  Clean up any spills right away.  Avoid walking on wet floors.  Keep items that you use a lot in easy-to-reach places.  If you need to reach something above you, use a strong step stool that has a grab bar.  Keep electrical cords out of the way.  Do not use floor polish or wax that makes  floors slippery. If you must use wax, use non-skid floor wax.  Do not have throw rugs and other things on the floor that can make you trip. What can I do with my stairs?  Do not leave any items on the stairs.  Make sure that there are handrails on both sides of the stairs and use them. Fix handrails that are broken or loose. Make sure that handrails are as long as the stairways.  Check any carpeting to make sure that it is firmly attached to the stairs. Fix any carpet that is loose or worn.  Avoid having throw rugs at the top or bottom of the stairs. If you do have throw rugs, attach them to the floor with carpet tape.  Make sure that you have a light switch at the top of the stairs and the bottom of the stairs. If you do not have them, ask someone to add them for you. What else can I do to help prevent falls?  Wear shoes that:  Do not have high heels.  Have rubber bottoms.  Are  comfortable and fit you well.  Are closed at the toe. Do not wear sandals.  If you use a stepladder:  Make sure that it is fully opened. Do not climb a closed stepladder.  Make sure that both sides of the stepladder are locked into place.  Ask someone to hold it for you, if possible.  Clearly mark and make sure that you can see:  Any grab bars or handrails.  First and last steps.  Where the edge of each step is.  Use tools that help you move around (mobility aids) if they are needed. These include:  Canes.  Walkers.  Scooters.  Crutches.  Turn on the lights when you go into a dark area. Replace any light bulbs as soon as they burn out.  Set up your furniture so you have a clear path. Avoid moving your furniture around.  If any of your floors are uneven, fix them.  If there are any pets around you, be aware of where they are.  Review your medicines with your doctor. Some medicines can make you feel dizzy. This can increase your chance of falling. Ask your doctor what other things that you can do to help prevent falls. This information is not intended to replace advice given to you by your health care provider. Make sure you discuss any questions you have with your health care provider. Document Released: 02/14/2009 Document Revised: 09/26/2015 Document Reviewed: 05/25/2014 Elsevier Interactive Patient Education  2017 [employer].

## 2018-09-22 DIAGNOSIS — E042 Nontoxic multinodular goiter: Secondary | ICD-10-CM | POA: Diagnosis not present

## 2018-10-02 ENCOUNTER — Other Ambulatory Visit: Payer: Self-pay | Admitting: Family Medicine

## 2018-10-03 ENCOUNTER — Other Ambulatory Visit: Payer: Self-pay | Admitting: Family Medicine

## 2018-10-06 ENCOUNTER — Ambulatory Visit (INDEPENDENT_AMBULATORY_CARE_PROVIDER_SITE_OTHER): Payer: Medicare Other | Admitting: Family Medicine

## 2018-10-06 ENCOUNTER — Encounter: Payer: Self-pay | Admitting: Family Medicine

## 2018-10-06 ENCOUNTER — Other Ambulatory Visit: Payer: Self-pay

## 2018-10-06 VITALS — BP 120/70 | HR 80 | Ht 68.0 in | Wt 215.0 lb

## 2018-10-06 DIAGNOSIS — E1140 Type 2 diabetes mellitus with diabetic neuropathy, unspecified: Secondary | ICD-10-CM

## 2018-10-06 DIAGNOSIS — I10 Essential (primary) hypertension: Secondary | ICD-10-CM

## 2018-10-06 DIAGNOSIS — K219 Gastro-esophageal reflux disease without esophagitis: Secondary | ICD-10-CM

## 2018-10-06 MED ORDER — PANTOPRAZOLE SODIUM 40 MG PO TBEC: 40.0000 mg | DELAYED_RELEASE_TABLET | Freq: Two times a day (BID) | ORAL | 5 refills | Status: DC

## 2018-10-06 MED ORDER — LISINOPRIL 5 MG PO TABS: 5.0000 mg | ORAL_TABLET | Freq: Every day | ORAL | 5 refills | Status: DC

## 2018-10-06 NOTE — Patient Instructions (Signed)
Mediterranean Diet  A Mediterranean diet refers to food and lifestyle choices that are based on the traditions of countries located on the [place]. This way of eating has been shown to help prevent certain conditions and improve outcomes for people who have chronic diseases, like kidney disease and heart disease.  What are tips for following this plan?  Lifestyle   Cook and eat meals together with your family, when possible.   Drink enough fluid to keep your urine clear or pale yellow.   Be physically active every day. This includes:  ? Aerobic exercise like running or swimming.  ? Leisure activities like gardening, walking, or housework.   Get 7-8 hours of sleep each night.   If recommended by your health care provider, drink red wine in moderation. This means 1 glass a day for nonpregnant women and 2 glasses a day for men. A glass of wine equals 5 oz (150 mL).  Reading food labels     Check the serving size of packaged foods. For foods such as rice and pasta, the serving size refers to the amount of cooked product, not dry.   Check the total fat in packaged foods. Avoid foods that have saturated fat or trans fats.   Check the ingredients list for added sugars, such as corn syrup.  Shopping   At the grocery store, buy most of your food from the areas near the walls of the store. This includes:  ? Fresh fruits and vegetables (produce).  ? Grains, beans, nuts, and seeds. Some of these may be available in unpackaged forms or large amounts (in bulk).  ? Fresh seafood.  ? Poultry and eggs.  ? Low-fat dairy products.   Buy whole ingredients instead of prepackaged foods.   Buy fresh fruits and vegetables in-season from local farmers markets.   Buy frozen fruits and vegetables in resealable bags.   If you do not have access to quality fresh seafood, buy precooked frozen shrimp or canned fish, such as tuna, salmon, or sardines.   Buy small amounts of raw or cooked vegetables, salads, or olives from  the deli or salad bar at your store.   Stock your pantry so you always have certain foods on hand, such as olive oil, canned tuna, canned tomatoes, rice, pasta, and beans.  Cooking   Cook foods with extra-virgin olive oil instead of using butter or other vegetable oils.   Have meat as a side dish, and have vegetables or grains as your main dish. This means having meat in small portions or adding small amounts of meat to foods like pasta or stew.   Use beans or vegetables instead of meat in common dishes like chili or lasagna.   Experiment with different cooking methods. Try roasting or broiling vegetables instead of steaming or sauteing them.   Add frozen vegetables to soups, stews, pasta, or rice.   Add nuts or seeds for added healthy fat at each meal. You can add these to yogurt, salads, or vegetable dishes.   Marinate fish or vegetables using olive oil, lemon juice, garlic, and fresh herbs.  Meal planning     Plan to eat 1 vegetarian meal one day each week. Try to work up to 2 vegetarian meals, if possible.   Eat seafood 2 or more times a week.   Have healthy snacks readily available, such as:  ? Vegetable sticks with hummus.  ? Greek yogurt.  ? Fruit and nut trail mix.   Eat balanced   meals throughout the week. This includes:  ? Fruit: 2-3 servings a day  ? Vegetables: 4-5 servings a day  ? Low-fat dairy: 2 servings a day  ? Fish, poultry, or lean meat: 1 serving a day  ? Beans and legumes: 2 or more servings a week  ? Nuts and seeds: 1-2 servings a day  ? Whole grains: 6-8 servings a day  ? Extra-virgin olive oil: 3-4 servings a day   Limit red meat and sweets to only a few servings a month  What are my food choices?   Mediterranean diet  ? Recommended  ? Grains: Whole-grain pasta. Brown rice. Bulgar wheat. Polenta. Couscous. Whole-wheat bread. Oatmeal. Quinoa.  ? Vegetables: Artichokes. Beets. Broccoli. Cabbage. Carrots. Eggplant. Green beans. Chard. Kale. Spinach. Onions. Leeks. Peas. Squash.  Tomatoes. Peppers. Radishes.  ? Fruits: Apples. Apricots. Avocado. Berries. Bananas. Cherries. Dates. Figs. Grapes. Lemons. Melon. Oranges. Peaches. Plums. Pomegranate.  ? Meats and other protein foods: Beans. Almonds. Sunflower seeds. Pine nuts. Peanuts. Cod. Salmon. Scallops. Shrimp. Tuna. Tilapia. Clams. Oysters. Eggs.  ? Dairy: Low-fat milk. Cheese. Greek yogurt.  ? Beverages: Water. Red wine. Herbal tea.  ? Fats and oils: Extra virgin olive oil. Avocado oil. Grape seed oil.  ? Sweets and desserts: Greek yogurt with honey. Baked apples. Poached pears. Trail mix.  ? Seasoning and other foods: Basil. Cilantro. Coriander. Cumin. Mint. Parsley. Sage. Rosemary. Tarragon. Garlic. Oregano. Thyme. Pepper. Balsalmic vinegar. Tahini. Hummus. Tomato sauce. Olives. Mushrooms.  ? Limit these  ? Grains: Prepackaged pasta or rice dishes. Prepackaged cereal with added sugar.  ? Vegetables: Deep fried potatoes (french fries).  ? Fruits: Fruit canned in syrup.  ? Meats and other protein foods: Beef. Pork. Lamb. Poultry with skin. Hot dogs. Bacon.  ? Dairy: Ice cream. Sour cream. Whole milk.  ? Beverages: Juice. Sugar-sweetened soft drinks. Beer. Liquor and spirits.  ? Fats and oils: Butter. Canola oil. Vegetable oil. Beef fat (tallow). Lard.  ? Sweets and desserts: Cookies. Cakes. Pies. Candy.  ? Seasoning and other foods: Mayonnaise. Premade sauces and marinades.  ? The items listed may not be a complete list. Talk with your dietitian about what dietary choices are right for you.  Summary   The Mediterranean diet includes both food and lifestyle choices.   Eat a variety of fresh fruits and vegetables, beans, nuts, seeds, and whole grains.   Limit the amount of red meat and sweets that you eat.   Talk with your health care provider about whether it is safe for you to drink red wine in moderation. This means 1 glass a day for nonpregnant women and 2 glasses a day for men. A glass of wine equals 5 oz (150 mL).  This information  is not intended to replace advice given to you by your health care provider. Make sure you discuss any questions you have with your health care provider.  Document Released: 12/12/2015 Document Revised: 01/14/2016 Document Reviewed: 12/12/2015  Elsevier Interactive Patient Education  2019 [employer]

## 2018-10-06 NOTE — Progress Notes (Signed)
Date:  10/06/2018   Name:  Juan [name]   DOB:  1950-11-01   MRN:  [id number]   Chief Complaint: Hypertension and Gastroesophageal Reflux  Hypertension  This is a chronic problem. The current episode started more than 1 year ago. The problem is unchanged. The problem is controlled. Pertinent negatives include no anxiety, blurred vision, chest pain, headaches, malaise/fatigue, neck pain, orthopnea, palpitations, peripheral edema, PND, shortness of breath or sweats. There are no associated agents to hypertension. There are no known risk factors for coronary artery disease. Past treatments include ACE inhibitors. The current treatment provides moderate improvement. There are no compliance problems.  There is no history of angina, kidney disease, CAD/MI, CVA, heart failure, left ventricular hypertrophy, PVD or retinopathy. There is no history of chronic renal disease, a hypertension causing med or renovascular disease.  Gastroesophageal Reflux  He reports no abdominal pain, no belching, no chest pain, no choking, no coughing, no dysphagia, no early satiety, no globus sensation, no heartburn, no hoarse voice, no nausea, no sore throat or no wheezing. This is a chronic problem. The problem has been gradually improving. Nothing aggravates the symptoms. Pertinent negatives include no anemia, fatigue, melena, muscle weakness, orthopnea or weight loss. Risk factors include obesity. He has tried a PPI for the symptoms. The treatment provided moderate relief.  Diabetes  He presents for his follow-up diabetic visit. He has type 2 diabetes mellitus. There are no hypoglycemic associated symptoms. Pertinent negatives for hypoglycemia include no confusion, dizziness, headaches, hunger, nervousness/anxiousness or sweats. Associated symptoms include foot paresthesias. Pertinent negatives for diabetes include no blurred vision, no chest pain, no fatigue, no foot ulcerations, no polydipsia, no polyphagia, no polyuria,  no visual change, no weakness and no weight loss. There are no hypoglycemic complications. Symptoms are stable. Pertinent negatives for diabetic complications include no CVA, impotence, nephropathy, peripheral neuropathy, PVD or retinopathy. Current diabetic treatment includes diet. He is compliant with treatment all of the time. His weight is stable. He is following a generally healthy diet.    Review of Systems  Constitutional: Negative for chills, fatigue, fever, malaise/fatigue and weight loss.  HENT: Negative for drooling, ear discharge, ear pain, hoarse voice and sore throat.   Eyes: Negative for blurred vision.  Respiratory: Negative for cough, choking, shortness of breath and wheezing.   Cardiovascular: Negative for chest pain, palpitations, orthopnea, leg swelling and PND.  Gastrointestinal: Negative for abdominal pain, blood in stool, constipation, diarrhea, dysphagia, heartburn, melena and nausea.  Endocrine: Negative for polydipsia, polyphagia and polyuria.  Genitourinary: Negative for dysuria, frequency, hematuria, impotence and urgency.  Musculoskeletal: Negative for back pain, myalgias, muscle weakness and neck pain.  Skin: Negative for rash.  Allergic/Immunologic: Negative for environmental allergies.  Neurological: Negative for dizziness, weakness and headaches.  Hematological: Does not bruise/bleed easily.  Psychiatric/Behavioral: Negative for confusion and suicidal ideas. The patient is not nervous/anxious.     Patient Active Problem List   Diagnosis Date Noted  . Multinodular goiter 08/29/2018  . History of sarcoidosis 06/29/2018  . Thyroid nodule 05/24/2018  . Acetonemia due to secondary diabetes ([place]) 09/29/2017  . Gastric hypersecretion 09/29/2017  . Microalbuminuria 03/31/2017  . B12 deficiency 03/01/2017  . Prostate cancer ([place]) 08/28/2016  . Obesity (BMI 30-39.9) 08/28/2016  . Kidney stones, calcium oxalate 08/28/2016  . ED (erectile dysfunction) 08/28/2016   . History of duodenal ulcer 08/28/2016  . Type 2 diabetes, controlled, with neuropathy ([place]) 08/28/2016  . History of cholelithiasis 08/28/2016  . Arthritis 08/28/2016  .  Gastroesophageal reflux disease 08/28/2016  . Hyperlipidemia, unspecified 04/04/2015  . Hyperlipidemia 04/04/2015  . Vitamin D deficiency, unspecified 07/12/2013  . Vitamin D deficiency 07/12/2013  . Atrophic kidney 01/13/2013  . Status post partial gastrectomy 10/11/2012  . H/O vagotomy 10/11/2012    Allergies  Allergen Reactions  . Nsaids Other (See Comments)    Hx of PUD  . Statins Rash    Hyperglycemia    Past Surgical History:  Procedure Laterality Date  . APPENDECTOMY    . CHOLECYSTECTOMY    . EYE SURGERY     bialteral cataract extraction  . GASTRECTOMY    . INSERTION PROSTATE RADIATION SEED  2002   Brachytherapy  . nasal tumor      Social History   Tobacco Use  . Smoking status: Former Smoker    Packs/day: 2.00    Years: 35.00    Pack years: 70.00    Types: Cigarettes    Last attempt to quit: 1998    Years since quitting: 22.4  . Smokeless tobacco: Never Used  . Tobacco comment: smoking cessation materials not required  Substance Use Topics  . Alcohol use: Yes    Comment: Occasional beer  . Drug use: No     Medication list has been reviewed and updated.  Current Meds  Medication Sig  . acetaminophen (TYLENOL) 500 MG tablet Take 1,000 mg by mouth 2 (two) times daily as needed for pain.  . Cholecalciferol (GNP VITAMIN D MAXIMUM STRENGTH) 2000 units TABS Take 1 tablet (2,000 Units total) by mouth daily.  . Cyanocobalamin (B-12) 500 MCG TABS Take 1 tablet by mouth daily.  . Darolutamide (NUBEQA) 300 MG TABS Take 600 mg by mouth 2 (two) times daily.  Marland Kitchen dutasteride (AVODART) 0.5 MG capsule Take 0.5 mg by mouth daily. Dr Gwenlyn Found  . leuprolide (LUPRON DEPOT, 42-MONTH,) 22.5 MG injection Inject into the muscle. Dr Gwenlyn Found- cancer center  . lisinopril (ZESTRIL) 5 MG tablet TAKE 1 TABLET BY  MOUTH EVERY DAY  . Multiple Vitamin (MULTI-VITAMINS) TABS Take by mouth.  . pantoprazole (PROTONIX) 40 MG tablet TAKE 1 TABLET BY MOUTH TWICE A DAY    PHQ 2/9 Scores 09/21/2018 09/29/2017 09/20/2017 03/01/2017  PHQ - 2 Score 0 0 0 0  PHQ- 9 Score - 0 0 -    BP Readings from Last 3 Encounters:  10/06/18 120/70  09/21/18 124/76  06/29/18 138/88    Physical Exam Vitals signs reviewed.  HENT:     Head: Normocephalic.     Right Ear: External ear normal.     Left Ear: External ear normal.     Nose: Nose normal.  Eyes:     General: No scleral icterus.       Right eye: No discharge.        Left eye: No discharge.     Conjunctiva/sclera: Conjunctivae normal.     Pupils: Pupils are equal, round, and reactive to light.  Neck:     Musculoskeletal: Normal range of motion and neck supple.     Thyroid: No thyromegaly.     Vascular: No JVD.     Trachea: No tracheal deviation.  Cardiovascular:     Rate and Rhythm: Normal rate and regular rhythm.     Heart sounds: Normal heart sounds. No murmur. No friction rub. No gallop.   Pulmonary:     Effort: No respiratory distress.     Breath sounds: Normal breath sounds. No stridor. No wheezing, rhonchi or rales.  Chest:  Chest wall: No tenderness.  Abdominal:     General: Bowel sounds are normal.     Palpations: Abdomen is soft. There is no mass.     Tenderness: There is no abdominal tenderness. There is no guarding or rebound.  Musculoskeletal: Normal range of motion.        General: No tenderness.       Feet:  Feet:     Right foot:     Protective Sensation: 10 sites tested. 6 sites sensed.     Left foot:     Protective Sensation: 10 sites tested. 6 sites sensed.     Comments: Sensed doarsum and arch of feet Lymphadenopathy:     Cervical: No cervical adenopathy.  Skin:    General: Skin is warm.     Findings: No rash.  Neurological:     Mental Status: He is alert and oriented to person, place, and time.     Cranial Nerves: No  cranial nerve deficit.     Deep Tendon Reflexes: Reflexes are normal and symmetric.     Wt Readings from Last 3 Encounters:  10/06/18 215 lb (97.5 kg)  09/21/18 221 lb 9.6 oz (100.5 kg)  06/29/18 215 lb (97.5 kg)    BP 120/70   Pulse 80   Ht 5\' 8"  (1.727 m)   Wt 215 lb (97.5 kg)   BMI 32.69 kg/m   Assessment and Plan:  1. Essential hypertension Chronic.  Controlled.  Continue lisinopril 5 mg once a day.  Will check renal function panel.  Will re-check patient in 6 months - Renal Function Panel - lisinopril (ZESTRIL) 5 MG tablet; Take 1 tablet (5 mg total) by mouth daily.  Dispense: 30 tablet; Refill: 5  2. Type 2 diabetes, controlled, with neuropathy (HCC) Chronic.  Controlled.  Patient is controlled with diet with no issues.  Previous review of A1c's are in the mid 5 range.  We will check a renal function panel A1c and lipid panel.  Foot exam was normal. - Renal Function Panel - Hemoglobin A1c - Lipid panel  3. Gastroesophageal reflux disease, esophagitis presence not specified Chronic.  Controlled.  Will continue Protonix 40 mg twice a day as initiated by gastroenterology.  Recheck in 6 months. - pantoprazole (PROTONIX) 40 MG tablet; Take 1 tablet (40 mg total) by mouth 2 (two) times daily.  Dispense: 60 tablet; Refill: 5

## 2018-10-07 LAB — LIPID PANEL
Chol/HDL Ratio: 2.7 ratio (ref 0.0–5.0)
Cholesterol, Total: 160 mg/dL (ref 100–199)
HDL: 59 mg/dL (ref >39)
LDL Calculated: 79 mg/dL (ref 0–99)
Triglycerides: 109 mg/dL (ref 0–149)
VLDL Cholesterol Cal: 22 mg/dL (ref 5–40)

## 2018-10-07 LAB — RENAL FUNCTION PANEL
Albumin: 4.4 g/dL (ref 3.8–4.8)
BUN/Creatinine Ratio: 17 (ref 10–24)
BUN: 21 mg/dL (ref 8–27)
CO2: 21 mmol/L (ref 20–29)
Calcium: 9.6 mg/dL (ref 8.6–10.2)
Chloride: 103 mmol/L (ref 96–106)
Creatinine, Ser: 1.21 mg/dL (ref 0.76–1.27)
GFR calc Af Amer: 70 mL/min/{1.73_m2} (ref >59)
GFR calc non Af Amer: 61 mL/min/{1.73_m2} (ref >59)
Glucose: 76 mg/dL (ref 65–99)
Phosphorus: 3.4 mg/dL (ref 2.8–4.1)
Potassium: 4.6 mmol/L (ref 3.5–5.2)
Sodium: 139 mmol/L (ref 134–144)

## 2018-10-07 LAB — HEMOGLOBIN A1C
Est. average glucose Bld gHb Est-mCnc: 114 mg/dL
Hgb A1c MFr Bld: 5.6 % (ref 4.8–5.6)

## 2018-11-06 ENCOUNTER — Encounter: Payer: Self-pay | Admitting: Family Medicine

## 2018-12-14 DIAGNOSIS — E559 Vitamin D deficiency, unspecified: Secondary | ICD-10-CM | POA: Diagnosis not present

## 2018-12-14 DIAGNOSIS — Z87891 Personal history of nicotine dependence: Secondary | ICD-10-CM | POA: Diagnosis not present

## 2018-12-14 DIAGNOSIS — C61 Malignant neoplasm of prostate: Secondary | ICD-10-CM | POA: Diagnosis not present

## 2018-12-20 DIAGNOSIS — H35342 Macular cyst, hole, or pseudohole, left eye: Secondary | ICD-10-CM | POA: Diagnosis not present

## 2018-12-20 LAB — HM DIABETES EYE EXAM

## 2018-12-22 ENCOUNTER — Other Ambulatory Visit: Payer: Self-pay

## 2019-02-03 ENCOUNTER — Encounter: Payer: Self-pay | Admitting: Family Medicine

## 2019-03-22 DIAGNOSIS — Z87891 Personal history of nicotine dependence: Secondary | ICD-10-CM | POA: Diagnosis not present

## 2019-03-22 DIAGNOSIS — Z23 Encounter for immunization: Secondary | ICD-10-CM | POA: Diagnosis not present

## 2019-03-22 DIAGNOSIS — C61 Malignant neoplasm of prostate: Secondary | ICD-10-CM | POA: Diagnosis not present

## 2019-03-22 DIAGNOSIS — E559 Vitamin D deficiency, unspecified: Secondary | ICD-10-CM | POA: Diagnosis not present

## 2019-03-26 IMAGING — CT CT ABD-PEL WO/W CM
3 of 12 series · 12 of 46 positions shown, 18 images · IV contrast (iopamidol)
Comparison: None.

CLINICAL DATA: Gross hematuria, sarcoid.  Prostate cancer.

EXAM:
CT ABDOMEN AND PELVIS WITHOUT AND WITH CONTRAST
TECHNIQUE: Multidetector CT imaging of the abdomen and pelvis was performed
following the standard protocol before and following the bolus
administration of intravenous contrast.
CONTRAST:  125mL 90ATEO-Z00 IOPAMIDOL (90ATEO-Z00) INJECTION 61%

[Series 2: hematuria > 45 wo · axial · 0.81mm/px · z∈[-808,-678]mm · 3 of 93 slices shown]
[im 14/93  soft-tissue]
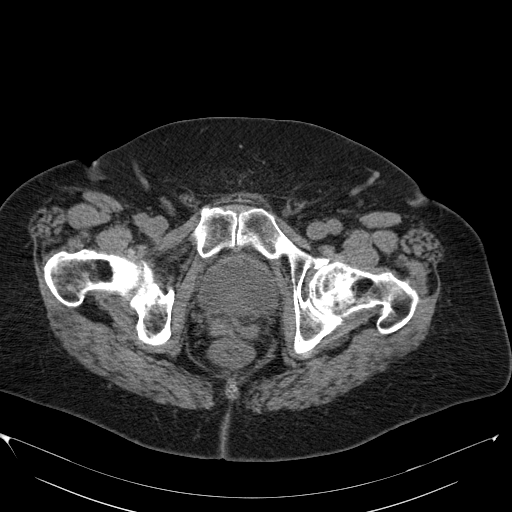
[im 27/93  soft-tissue]
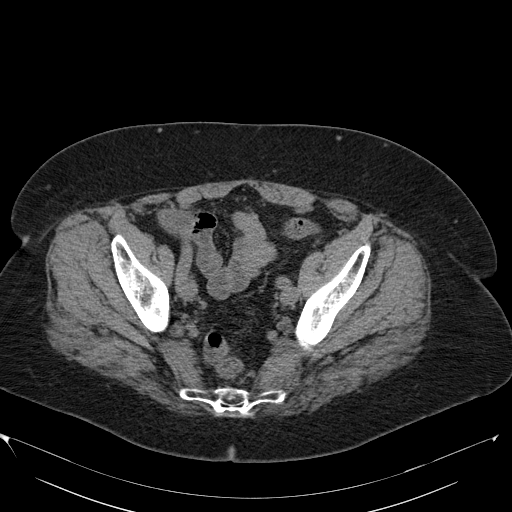
[im 40/93  soft-tissue]
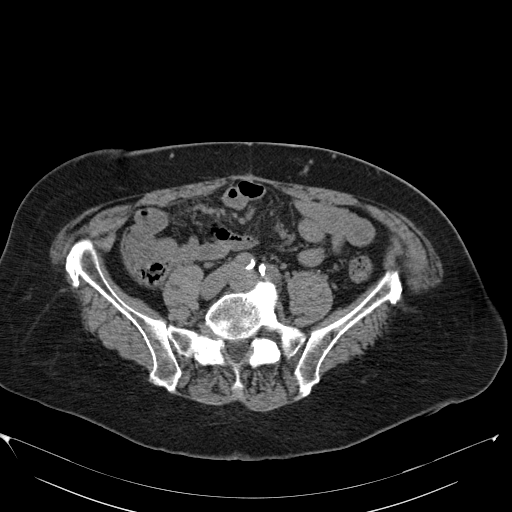

[Series 9: hematuria > 45 10 min delay · axial · delayed · 0.90mm/px · z∈[-887,-502]mm · 7 of 103 slices shown, 12 images]
[im 13/103  soft-tissue]
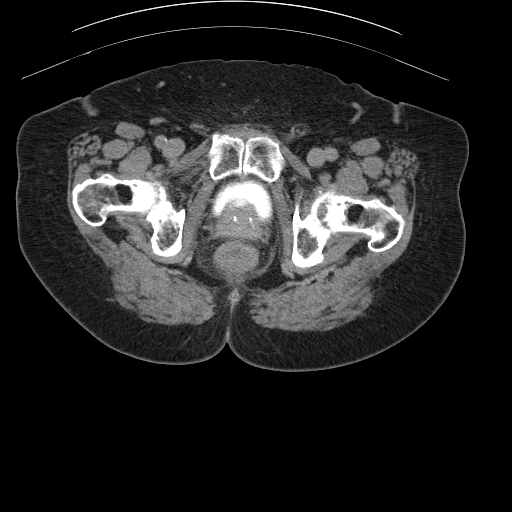
[im 13/103  bone]
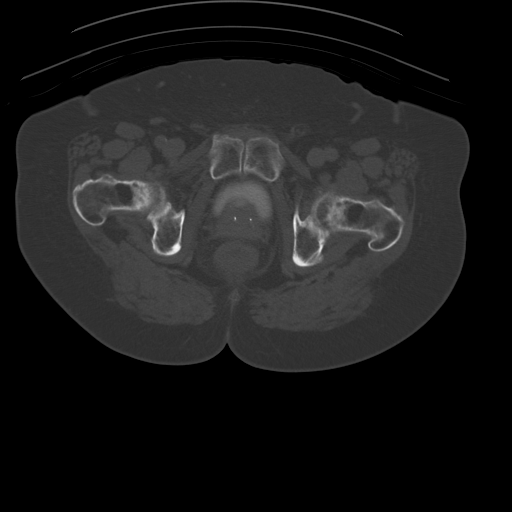
[im 26/103  soft-tissue]
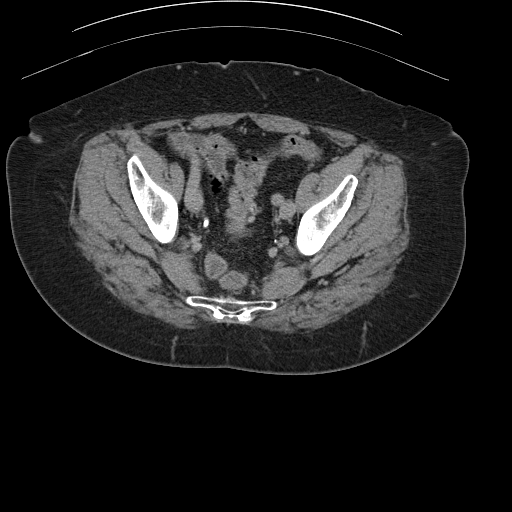
[im 39/103  soft-tissue]
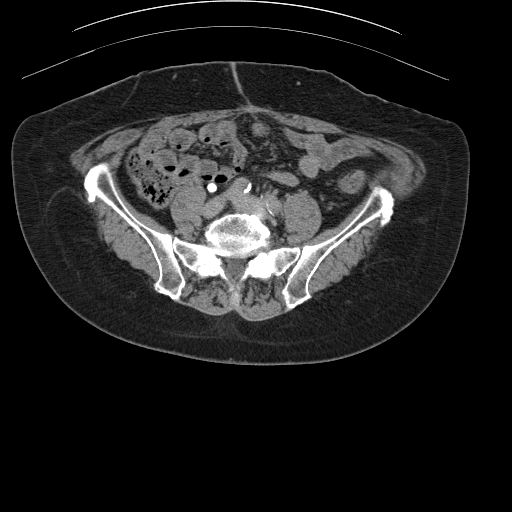
[im 52/103  soft-tissue]
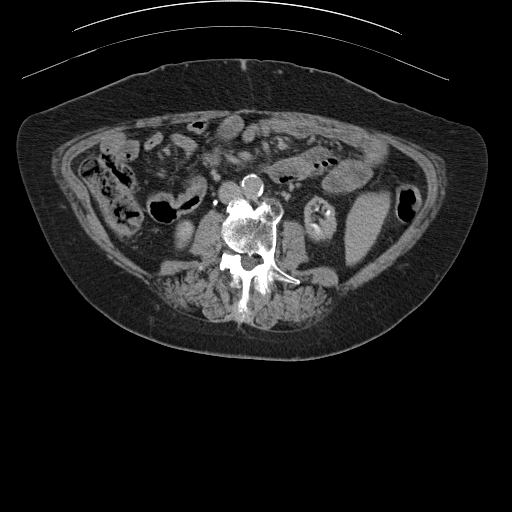
[im 52/103  lung]
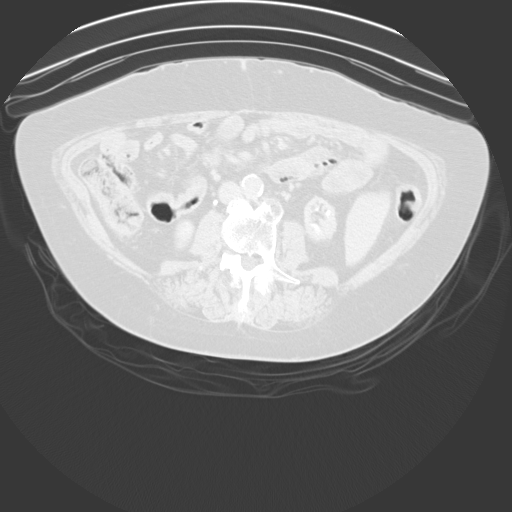
[im 64/103  soft-tissue]
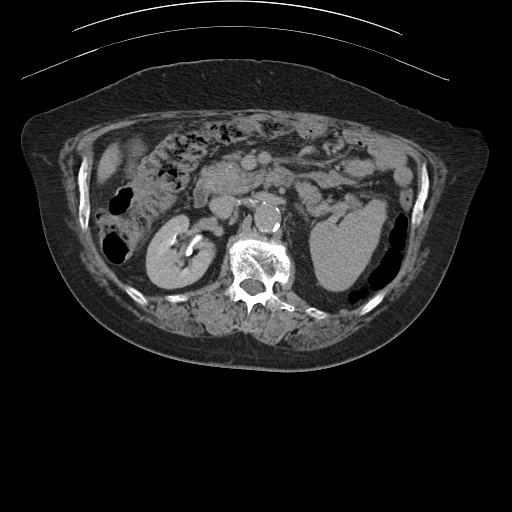
[im 64/103  lung]
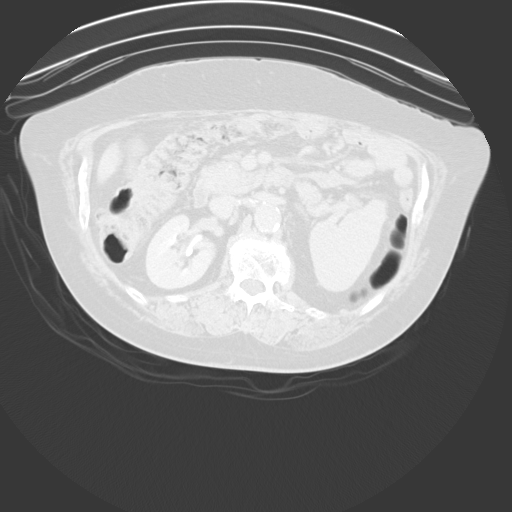
[im 77/103  soft-tissue]
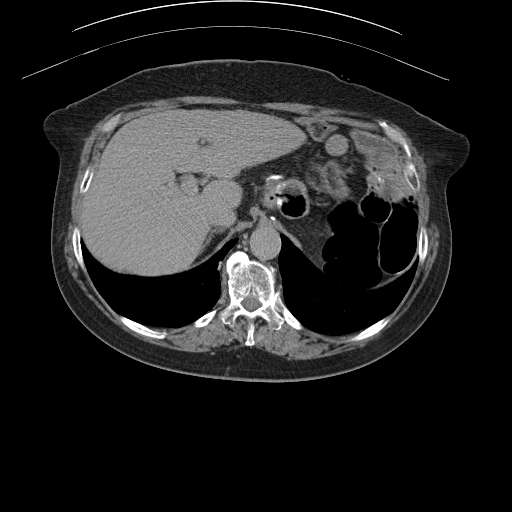
[im 77/103  lung]
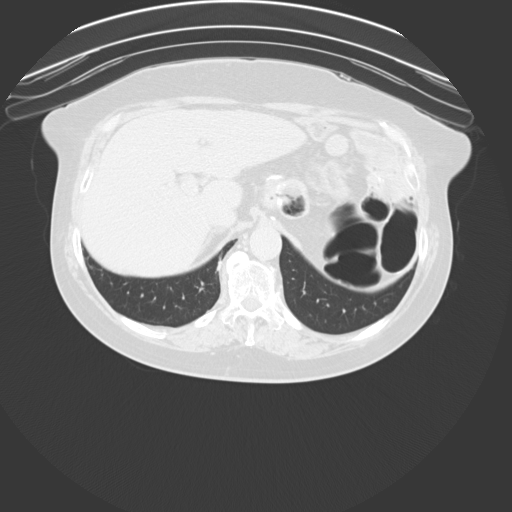
[im 90/103  soft-tissue]
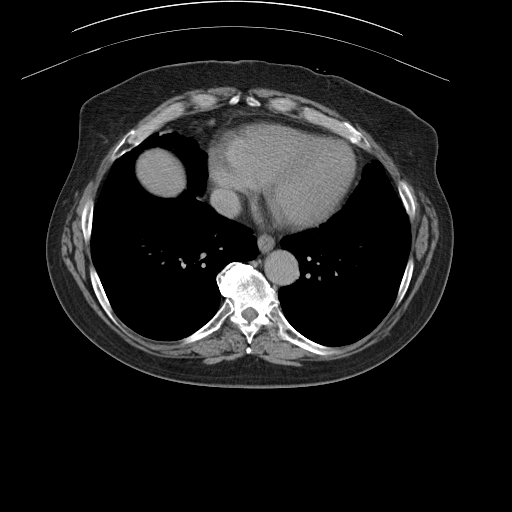
[im 90/103  lung]
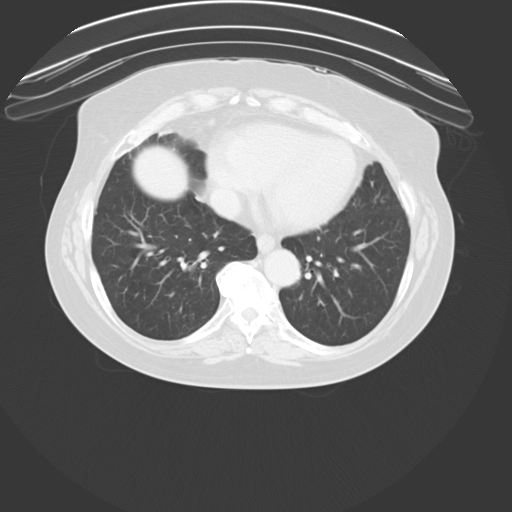

[Series 602: without coronal · coronal · non-contrast · 0.91mm/px · 2 of 106 slices shown, 3 images]
[im 36/106  soft-tissue]
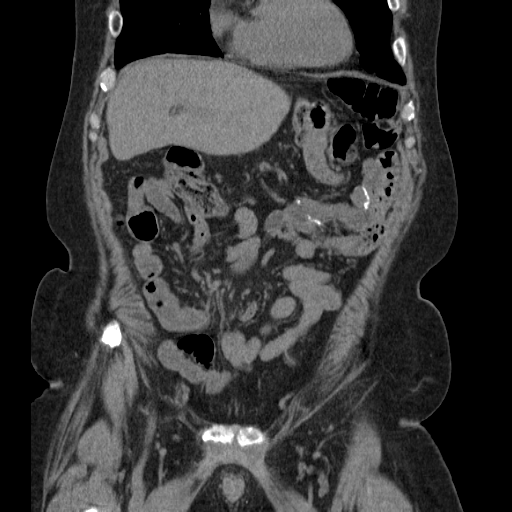
[im 36/106  bone]
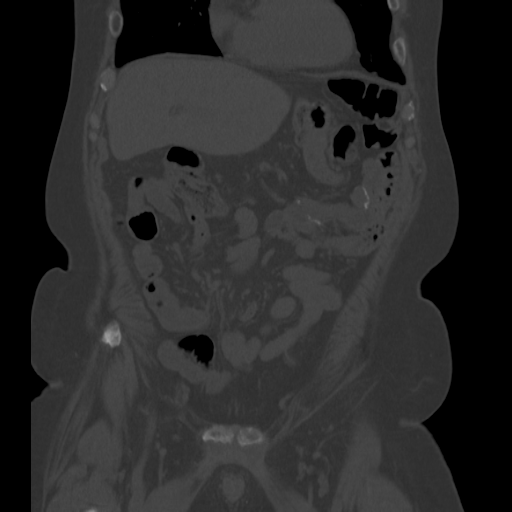
[im 71/106  soft-tissue]
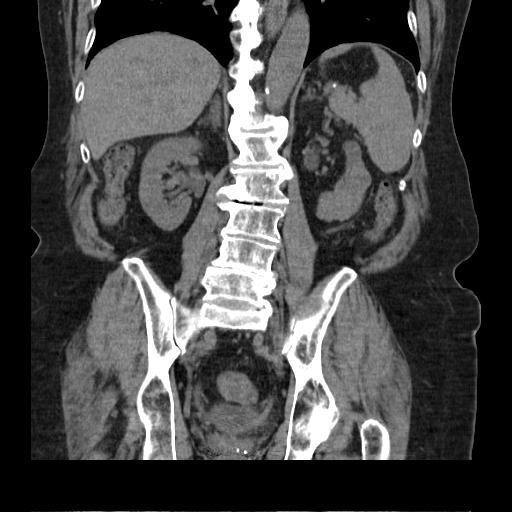

[12 of 46 positions shown; findings below may reference images not displayed]

FINDINGS: Lower chest: Few scattered millimetric pulmonary nodules are
nonspecific. Heart size normal. Coronary artery calcification. No
pericardial or pleural effusion. Postoperative changes at the
gastroesophageal junction.

Hepatobiliary: The liver is unremarkable. Cholecystectomy. No
biliary ductal dilatation.

Pancreas: Negative.

Spleen: Negative.

Adrenals/Urinary Tract: Adrenal glands are unremarkable. Stones are
seen in the kidneys bilaterally. Left kidney is atrophic. 1.6 cm
low-attenuation lesion in the left kidney shows no enhancement,
consistent with a cyst. Ureters are decompressed. No filling defects
in the right intrarenal collecting system or right ureter. Left
intrarenal collecting system and left ureter are poorly opacified.
Bladder is unremarkable.

Stomach/Bowel: Gastric bypass. Stomach, small bowel and colon are
otherwise unremarkable. Appendix is not readily visualized.

Vascular/Lymphatic: Atherosclerotic calcification of the arterial
vasculature without abdominal aortic aneurysm. No pathologically
enlarged lymph nodes.

Reproductive: Brachytherapy seeds are seen in the prostate.

Other: No free fluid.  Mesenteries and peritoneum are unremarkable.

Musculoskeletal: Degenerative changes in the spine. No worrisome
lytic or sclerotic lesions.
IMPRESSION: 1. Bilateral renal stones. Left intrarenal collecting system and
left ureter are poorly opacified, limiting evaluation. Otherwise, no
findings to explain the patient's hematuria.
2. Aortic atherosclerosis ([id number]). Coronary artery
calcification.

## 2019-04-11 ENCOUNTER — Ambulatory Visit (INDEPENDENT_AMBULATORY_CARE_PROVIDER_SITE_OTHER): Payer: Medicare Other | Admitting: Family Medicine

## 2019-04-11 ENCOUNTER — Encounter: Payer: Self-pay | Admitting: Family Medicine

## 2019-04-11 ENCOUNTER — Other Ambulatory Visit: Payer: Self-pay

## 2019-04-11 VITALS — BP 130/80 | HR 64 | Ht 68.0 in | Wt 228.0 lb

## 2019-04-11 DIAGNOSIS — Z862 Personal history of diseases of the blood and blood-forming organs and certain disorders involving the immune mechanism: Secondary | ICD-10-CM | POA: Diagnosis not present

## 2019-04-11 DIAGNOSIS — K219 Gastro-esophageal reflux disease without esophagitis: Secondary | ICD-10-CM | POA: Diagnosis not present

## 2019-04-11 DIAGNOSIS — I10 Essential (primary) hypertension: Secondary | ICD-10-CM | POA: Diagnosis not present

## 2019-04-11 DIAGNOSIS — E1140 Type 2 diabetes mellitus with diabetic neuropathy, unspecified: Secondary | ICD-10-CM

## 2019-04-11 DIAGNOSIS — E669 Obesity, unspecified: Secondary | ICD-10-CM | POA: Diagnosis not present

## 2019-04-11 MED ORDER — LISINOPRIL 5 MG PO TABS: 5.0000 mg | ORAL_TABLET | Freq: Every day | ORAL | 5 refills | Status: DC

## 2019-04-11 MED ORDER — PANTOPRAZOLE SODIUM 40 MG PO TBEC: 40.0000 mg | DELAYED_RELEASE_TABLET | Freq: Two times a day (BID) | ORAL | 5 refills | Status: DC

## 2019-04-11 NOTE — Progress Notes (Signed)
Date:  04/11/2019   Name:  Juan [name]   DOB:  [birth year]   MRN:  [id number]   Chief Complaint: Hypertension and Gastroesophageal Reflux  Hypertension This is a chronic problem. The current episode started more than 1 year ago. The problem has been gradually improving since onset. The problem is controlled. Pertinent negatives include no anxiety, blurred vision, chest pain, headaches, malaise/fatigue, neck pain, orthopnea, palpitations, peripheral edema, PND, shortness of breath or sweats. There are no associated agents to hypertension. Risk factors for coronary artery disease include obesity. Past treatments include ACE inhibitors. The current treatment provides moderate improvement. There are no compliance problems.  There is no history of angina, kidney disease, CAD/MI, CVA, heart failure, left ventricular hypertrophy, PVD or retinopathy. There is no history of chronic renal disease, a hypertension causing med or renovascular disease.  Gastroesophageal Reflux He reports no abdominal pain, no chest pain, no coughing, no dysphagia, no early satiety, no heartburn, no nausea, no sore throat or no wheezing. This is a chronic problem. The problem has been gradually improving. The symptoms are aggravated by certain foods. Pertinent negatives include no fatigue or weight loss. He has tried a PPI for the symptoms. The treatment provided moderate relief. Past procedures do not include an abdominal ultrasound, an EGD, esophageal manometry, esophageal pH monitoring, H. pylori antibody titer or a UGI.  Diabetes He presents for his follow-up (for prediabetes concern) diabetic visit. His disease course has been stable. Pertinent negatives for hypoglycemia include no confusion, dizziness, headaches, hunger, mood changes, nervousness/anxiousness, pallor, seizures, sleepiness, speech difficulty, sweats or tremors. Pertinent negatives for diabetes include no blurred vision, no chest pain, no fatigue, no foot  paresthesias, no foot ulcerations, no polydipsia, no polyphagia, no polyuria, no visual change, no weakness and no weight loss. Symptoms are stable. Pertinent negatives for diabetic complications include no CVA, PVD or retinopathy.    Lab Results  Component Value Date   CREATININE 1.21 10/06/2018   BUN 21 10/06/2018   NA 139 10/06/2018   K 4.6 10/06/2018   CL 103 10/06/2018   CO2 21 10/06/2018   Lab Results  Component Value Date   CHOL 160 10/06/2018   HDL 59 10/06/2018   LDLCALC 79 10/06/2018   TRIG 109 10/06/2018   CHOLHDL 2.7 10/06/2018   Lab Results  Component Value Date   TSH 1.467 03/01/2017   Lab Results  Component Value Date   HGBA1C 5.6 10/06/2018     Review of Systems  Constitutional: Negative for chills, fatigue, fever, malaise/fatigue and weight loss.  HENT: Negative for drooling, ear discharge, ear pain and sore throat.   Eyes: Negative for blurred vision.  Respiratory: Negative for cough, shortness of breath and wheezing.   Cardiovascular: Negative for chest pain, palpitations, orthopnea, leg swelling and PND.  Gastrointestinal: Negative for abdominal pain, blood in stool, constipation, diarrhea, dysphagia, heartburn and nausea.  Endocrine: Negative for polydipsia, polyphagia and polyuria.  Genitourinary: Negative for dysuria, frequency, hematuria and urgency.  Musculoskeletal: Negative for back pain, myalgias and neck pain.  Skin: Negative for pallor and rash.  Allergic/Immunologic: Negative for environmental allergies.  Neurological: Negative for dizziness, tremors, seizures, speech difficulty, weakness and headaches.  Hematological: Does not bruise/bleed easily.  Psychiatric/Behavioral: Negative for confusion and suicidal ideas. The patient is not nervous/anxious.     Patient Active Problem List   Diagnosis Date Noted   Multinodular goiter 08/29/2018   History of sarcoidosis 06/29/2018   Thyroid nodule 05/24/2018   Acetonemia due to  secondary  diabetes ([place]) 09/29/2017   Gastric hypersecretion 09/29/2017   Microalbuminuria 03/31/2017   B12 deficiency 03/01/2017   Prostate cancer ([place]) 08/28/2016   Obesity (BMI 30-39.9) 08/28/2016   Kidney stones, calcium oxalate 08/28/2016   ED (erectile dysfunction) 08/28/2016   History of duodenal ulcer 08/28/2016   Type 2 diabetes, controlled, with neuropathy ([place]) 08/28/2016   History of cholelithiasis 08/28/2016   Arthritis 08/28/2016   Gastroesophageal reflux disease 08/28/2016   Hyperlipidemia, unspecified 04/04/2015   Hyperlipidemia 04/04/2015   Vitamin D deficiency, unspecified 07/12/2013   Vitamin D deficiency 07/12/2013   Atrophic kidney 01/13/2013   Status post partial gastrectomy 10/11/2012   H/O vagotomy 10/11/2012    Allergies  Allergen Reactions   Nsaids Other (See Comments)    Hx of PUD   Statins Rash    Hyperglycemia    Past Surgical History:  Procedure Laterality Date   APPENDECTOMY     CHOLECYSTECTOMY     EYE SURGERY     bialteral cataract extraction   GASTRECTOMY     INSERTION PROSTATE RADIATION SEED  2002   Brachytherapy   nasal tumor      Social History   Tobacco Use   Smoking status: Former Smoker    Packs/day: 2.00    Years: 35.00    Pack years: 70.00    Types: Cigarettes    Quit date: 1998    Years since quitting: 22.9   Smokeless tobacco: Never Used   Tobacco comment: smoking cessation materials not required  Substance Use Topics   Alcohol use: Yes    Comment: Occasional beer   Drug use: No     Medication list has been reviewed and updated.  Current Meds  Medication Sig   acetaminophen (TYLENOL) 500 MG tablet Take 1,000 mg by mouth 2 (two) times daily as needed for pain.   Cholecalciferol (GNP VITAMIN D MAXIMUM STRENGTH) 2000 units TABS Take 1 tablet (2,000 Units total) by mouth daily.   Cyanocobalamin (B-12) 500 MCG TABS Take 1 tablet by mouth daily.   Darolutamide (NUBEQA) 300 MG TABS Take  600 mg by mouth 2 (two) times daily. berry   dutasteride (AVODART) 0.5 MG capsule Take 0.5 mg by mouth daily. Dr Gwenlyn Found   leuprolide (LUPRON DEPOT, 55-MONTH,) 22.5 MG injection Inject into the muscle. Dr Gwenlyn Found- cancer center   lisinopril (ZESTRIL) 5 MG tablet Take 1 tablet (5 mg total) by mouth daily.   Multiple Vitamin (MULTI-VITAMINS) TABS Take by mouth.   pantoprazole (PROTONIX) 40 MG tablet Take 1 tablet (40 mg total) by mouth 2 (two) times daily.    PHQ 2/9 Scores 09/21/2018 09/29/2017 09/20/2017 03/01/2017  PHQ - 2 Score 0 0 0 0  PHQ- 9 Score - 0 0 -    BP Readings from Last 3 Encounters:  04/11/19 130/80  10/06/18 120/70  09/21/18 124/76    Physical Exam Vitals signs and nursing note reviewed.  HENT:     Head: Normocephalic.     Right Ear: Tympanic membrane and external ear normal.     Left Ear: Tympanic membrane and external ear normal.     Nose: Nose normal.  Eyes:     General: No scleral icterus.       Right eye: No discharge.        Left eye: No discharge.     Conjunctiva/sclera: Conjunctivae normal.     Pupils: Pupils are equal, round, and reactive to light.  Neck:     Musculoskeletal: Normal range of  motion and neck supple.     Thyroid: No thyromegaly.     Vascular: No JVD.     Trachea: No tracheal deviation.  Cardiovascular:     Rate and Rhythm: Normal rate and regular rhythm.     Heart sounds: Normal heart sounds. No murmur. No friction rub. No gallop.   Pulmonary:     Effort: No respiratory distress.     Breath sounds: Normal breath sounds. No wheezing or rales.  Abdominal:     General: Bowel sounds are normal.     Palpations: Abdomen is soft. There is no mass.     Tenderness: There is no abdominal tenderness. There is no guarding or rebound.  Musculoskeletal: Normal range of motion.        General: No tenderness.  Lymphadenopathy:     Cervical: No cervical adenopathy.  Skin:    General: Skin is warm.     Findings: No rash.  Neurological:      Mental Status: He is alert and oriented to person, place, and time.     Cranial Nerves: No cranial nerve deficit.     Deep Tendon Reflexes: Reflexes are normal and symmetric.     Wt Readings from Last 3 Encounters:  04/11/19 228 lb (103.4 kg)  10/06/18 215 lb (97.5 kg)  09/21/18 221 lb 9.6 oz (100.5 kg)    BP 130/80    Pulse 64    Ht 5\' 8"  (1.727 m)    Wt 228 lb (103.4 kg)    BMI 34.67 kg/m   Assessment and Plan:  1. Essential hypertension Chronic.  Controlled.  Stable.  Will continue lisinopril 5 mg once a day.  Will check renal function panel. - lisinopril (ZESTRIL) 5 MG tablet; Take 1 tablet (5 mg total) by mouth daily.  Dispense: 30 tablet; Refill: 5 - Renal Function Panel  2. Type 2 diabetes, controlled, with neuropathy ([facility]) Chronic.  Diet controlled.  Stable.  Patient had last A1c in the 5.6 range.  Patient had a history of diabetes but came under control after a partial gastrectomy.  Will check renal function panel A1c for current level of control. - Hemoglobin A1c - Renal Function Panel  3. Gastroesophageal reflux disease, unspecified whether esophagitis present Chronic.  Controlled.  Stable.  Continue pantoprazole 40 mg once a day. - pantoprazole (PROTONIX) 40 MG tablet; Take 1 tablet (40 mg total) by mouth 2 (two) times daily.  Dispense: 60 tablet; Refill: 5  4. Obesity (BMI 30-39.9) Health risks of being over weight were discussed and patient was counseled on weight loss options and exercise.  5. History of sarcoidosis Patient is unaware of this and I cannot find exactly where the original concern was made probably on her past chest x-ray.  We will check a renal function panel to make sure there is no hypercalcemia and will probably have an awareness of this in the future. - Renal Function Panel

## 2019-04-11 NOTE — Patient Instructions (Signed)
° °Calorie Counting for Weight Loss °Calories are units of energy. Your body needs a certain amount of calories from food to keep you going throughout the day. When you eat more calories than your body needs, your body stores the extra calories as fat. When you eat fewer calories than your body needs, your body burns fat to get the energy it needs. °Calorie counting means keeping track of how many calories you eat and drink each day. Calorie counting can be helpful if you need to lose weight. If you make sure to eat fewer calories than your body needs, you should lose weight. Ask your health care provider what a healthy weight is for you. °For calorie counting to work, you will need to eat the right number of calories in a day in order to lose a healthy amount of weight per week. A dietitian can help you determine how many calories you need in a day and will give you suggestions on how to reach your calorie goal. °· A healthy amount of weight to lose per week is usually 1-2 lb (0.5-0.9 kg). This usually means that your daily calorie intake should be reduced by 500-750 calories. °· Eating 1,200 - 1,500 calories per day can help most women lose weight. °· Eating 1,500 - 1,800 calories per day can help most men lose weight. °What is my plan? °My goal is to have __________ calories per day. °If I have this many calories per day, I should lose around __________ pounds per week. °What do I need to know about calorie counting? °In order to meet your daily calorie goal, you will need to: °· Find out how many calories are in each food you would like to eat. Try to do this before you eat. °· Decide how much of the food you plan to eat. °· Write down what you ate and how many calories it had. Doing this is called keeping a food log. °To successfully lose weight, it is important to balance calorie counting with a healthy lifestyle that includes regular activity. Aim for 150 minutes of moderate exercise (such as walking) or 75  minutes of vigorous exercise (such as running) each week. °Where do I find calorie information? ° °The number of calories in a food can be found on a Nutrition Facts label. If a food does not have a Nutrition Facts label, try to look up the calories online or ask your dietitian for help. °Remember that calories are listed per serving. If you choose to have more than one serving of a food, you will have to multiply the calories per serving by the amount of servings you plan to eat. For example, the label on a package of bread might say that a serving size is 1 slice and that there are 90 calories in a serving. If you eat 1 slice, you will have eaten 90 calories. If you eat 2 slices, you will have eaten 180 calories. °How do I keep a food log? °Immediately after each meal, record the following information in your food log: °· What you ate. Don't forget to include toppings, sauces, and other extras on the food. °· How much you ate. This can be measured in cups, ounces, or number of items. °· How many calories each food and drink had. °· The total number of calories in the meal. °Keep your food log near you, such as in a small notebook in your pocket, or use a mobile app or website. Some programs will   calculate calories for you and show you how many calories you have left for the day to meet your goal. °What are some calorie counting tips? ° °· Use your calories on foods and drinks that will fill you up and not leave you hungry: °? Some examples of foods that fill you up are nuts and nut butters, vegetables, lean proteins, and high-fiber foods like whole grains. High-fiber foods are foods with more than 5 g fiber per serving. °? Drinks such as sodas, specialty coffee drinks, alcohol, and juices have a lot of calories, yet do not fill you up. °· Eat nutritious foods and avoid empty calories. Empty calories are calories you get from foods or beverages that do not have many vitamins or protein, such as candy, sweets, and  soda. It is better to have a nutritious high-calorie food (such as an avocado) than a food with few nutrients (such as a bag of chips). °· Know how many calories are in the foods you eat most often. This will help you calculate calorie counts faster. °· Pay attention to calories in drinks. Low-calorie drinks include water and unsweetened drinks. °· Pay attention to nutrition labels for "low fat" or "fat free" foods. These foods sometimes have the same amount of calories or more calories than the full fat versions. They also often have added sugar, starch, or salt, to make up for flavor that was removed with the fat. °· Find a way of tracking calories that works for you. Get creative. Try different apps or programs if writing down calories does not work for you. °What are some portion control tips? °· Know how many calories are in a serving. This will help you know how many servings of a certain food you can have. °· Use a measuring cup to measure serving sizes. You could also try weighing out portions on a kitchen scale. With time, you will be able to estimate serving sizes for some foods. °· Take some time to put servings of different foods on your favorite plates, bowls, and cups so you know what a serving looks like. °· Try not to eat straight from a bag or box. Doing this can lead to overeating. Put the amount you would like to eat in a cup or on a plate to make sure you are eating the right portion. °· Use smaller plates, glasses, and bowls to prevent overeating. °· Try not to multitask (for example, watch TV or use your computer) while eating. If it is time to eat, sit down at a table and enjoy your food. This will help you to know when you are full. It will also help you to be aware of what you are eating and how much you are eating. °What are tips for following this plan? °Reading food labels °· Check the calorie count compared to the serving size. The serving size may be smaller than what you are used to  eating. °· Check the source of the calories. Make sure the food you are eating is high in vitamins and protein and low in saturated and trans fats. °Shopping °· Read nutrition labels while you shop. This will help you make healthy decisions before you decide to purchase your food. °· Make a grocery list and stick to it. °Cooking °· Try to cook your favorite foods in a healthier way. For example, try baking instead of frying. °· Use low-fat dairy products. °Meal planning °· Use more fruits and vegetables. Half of your plate should be   fruits and vegetables. °· Include lean proteins like poultry and fish. °How do I count calories when eating out? °· Ask for smaller portion sizes. °· Consider sharing an entree and sides instead of getting your own entree. °· If you get your own entree, eat only half. Ask for a box at the beginning of your meal and put the rest of your entree in it so you are not tempted to eat it. °· If calories are listed on the menu, choose the lower calorie options. °· Choose dishes that include vegetables, fruits, whole grains, low-fat dairy products, and lean protein. °· Choose items that are boiled, broiled, grilled, or steamed. Stay away from items that are buttered, battered, fried, or served with cream sauce. Items labeled "crispy" are usually fried, unless stated otherwise. °· Choose water, low-fat milk, unsweetened iced tea, or other drinks without added sugar. If you want an alcoholic beverage, choose a lower calorie option such as a glass of wine or light beer. °· Ask for dressings, sauces, and syrups on the side. These are usually high in calories, so you should limit the amount you eat. °· If you want a salad, choose a garden salad and ask for grilled meats. Avoid extra toppings like bacon, cheese, or fried items. Ask for the dressing on the side, or ask for olive oil and vinegar or lemon to use as dressing. °· Estimate how many servings of a food you are given. For example, a serving of  cooked rice is ½ cup or about the size of half a baseball. Knowing serving sizes will help you be aware of how much food you are eating at restaurants. The list below tells you how big or small some common portion sizes are based on everyday objects: °? 1 oz--4 stacked dice. °? 3 oz--1 deck of cards. °? 1 tsp--1 die. °? 1 Tbsp--½ a ping-pong ball. °? 2 Tbsp--1 ping-pong ball. °? ½ cup--½ baseball. °? 1 cup--1 baseball. °Summary °· Calorie counting means keeping track of how many calories you eat and drink each day. If you eat fewer calories than your body needs, you should lose weight. °· A healthy amount of weight to lose per week is usually 1-2 lb (0.5-0.9 kg). This usually means reducing your daily calorie intake by 500-750 calories. °· The number of calories in a food can be found on a Nutrition Facts label. If a food does not have a Nutrition Facts label, try to look up the calories online or ask your dietitian for help. °· Use your calories on foods and drinks that will fill you up, and not on foods and drinks that will leave you hungry. °· Use smaller plates, glasses, and bowls to prevent overeating. °This information is not intended to replace advice given to you by your health care provider. Make sure you discuss any questions you have with your health care provider. °Document Released: 04/20/2005 Document Revised: 01/07/2018 Document Reviewed: 03/20/2016 °[employer] Patient Education © 2020 [employer] ° °

## 2019-04-12 LAB — HEMOGLOBIN A1C
Est. average glucose Bld gHb Est-mCnc: 108 mg/dL
Hgb A1c MFr Bld: 5.4 % (ref 4.8–5.6)

## 2019-04-12 LAB — RENAL FUNCTION PANEL
Albumin: 4.2 g/dL (ref 3.8–4.8)
BUN/Creatinine Ratio: 20 (ref 10–24)
BUN: 25 mg/dL (ref 8–27)
CO2: 25 mmol/L (ref 20–29)
Calcium: 9.3 mg/dL (ref 8.6–10.2)
Chloride: 105 mmol/L (ref 96–106)
Creatinine, Ser: 1.22 mg/dL (ref 0.76–1.27)
GFR calc Af Amer: 69 mL/min/{1.73_m2} (ref >59)
GFR calc non Af Amer: 60 mL/min/{1.73_m2} (ref >59)
Glucose: 92 mg/dL (ref 65–99)
Phosphorus: 3.1 mg/dL (ref 2.8–4.1)
Potassium: 4.5 mmol/L (ref 3.5–5.2)
Sodium: 142 mmol/L (ref 134–144)

## 2019-06-02 DIAGNOSIS — H43811 Vitreous degeneration, right eye: Secondary | ICD-10-CM | POA: Diagnosis not present

## 2019-06-14 DIAGNOSIS — Z79899 Other long term (current) drug therapy: Secondary | ICD-10-CM | POA: Diagnosis not present

## 2019-06-14 DIAGNOSIS — E559 Vitamin D deficiency, unspecified: Secondary | ICD-10-CM | POA: Diagnosis not present

## 2019-06-14 DIAGNOSIS — C61 Malignant neoplasm of prostate: Secondary | ICD-10-CM | POA: Diagnosis not present

## 2019-06-14 DIAGNOSIS — Z87891 Personal history of nicotine dependence: Secondary | ICD-10-CM | POA: Diagnosis not present

## 2019-07-28 ENCOUNTER — Encounter: Payer: Self-pay | Admitting: Family Medicine

## 2019-08-09 DIAGNOSIS — Z23 Encounter for immunization: Secondary | ICD-10-CM | POA: Diagnosis not present

## 2019-08-30 DIAGNOSIS — Z23 Encounter for immunization: Secondary | ICD-10-CM | POA: Diagnosis not present

## 2019-09-11 DIAGNOSIS — Z79899 Other long term (current) drug therapy: Secondary | ICD-10-CM | POA: Diagnosis not present

## 2019-09-11 DIAGNOSIS — Z192 Hormone resistant malignancy status: Secondary | ICD-10-CM | POA: Diagnosis not present

## 2019-09-11 DIAGNOSIS — Z923 Personal history of irradiation: Secondary | ICD-10-CM | POA: Diagnosis not present

## 2019-09-11 DIAGNOSIS — C61 Malignant neoplasm of prostate: Secondary | ICD-10-CM | POA: Diagnosis not present

## 2019-09-11 DIAGNOSIS — Z87891 Personal history of nicotine dependence: Secondary | ICD-10-CM | POA: Diagnosis not present

## 2019-09-11 DIAGNOSIS — E559 Vitamin D deficiency, unspecified: Secondary | ICD-10-CM | POA: Diagnosis not present

## 2019-09-27 ENCOUNTER — Other Ambulatory Visit: Payer: Self-pay

## 2019-09-27 ENCOUNTER — Ambulatory Visit (INDEPENDENT_AMBULATORY_CARE_PROVIDER_SITE_OTHER): Payer: Medicare Other

## 2019-09-27 VITALS — BP 138/78 | HR 57 | Resp 16 | Ht 68.0 in | Wt 227.0 lb

## 2019-09-27 DIAGNOSIS — Z0000 Encounter for general adult medical examination without abnormal findings: Secondary | ICD-10-CM

## 2019-09-27 DIAGNOSIS — Z1211 Encounter for screening for malignant neoplasm of colon: Secondary | ICD-10-CM | POA: Diagnosis not present

## 2019-09-27 NOTE — Progress Notes (Signed)
Subjective:   Juan [name] is a 69 y.o. male who presents for Medicare Annual/Subsequent preventive examination.  Review of Systems:   Cardiac Risk Factors include: advanced age (>66men, >9 women);hypertension;male gender;dyslipidemia;obesity (BMI >30kg/m2)     Objective:    Vitals: BP 138/78 (BP Location: Left Arm, Patient Position: Sitting, Cuff Size: Large)   Pulse (!) 57   Resp 16   Ht 5\' 8"  (1.727 m)   Wt 227 lb (103 kg)   SpO2 96%   BMI 34.52 kg/m   Body mass index is 34.52 kg/m.  Advanced Directives 09/27/2019 09/21/2018 04/28/2018 09/20/2017 03/01/2017  Does Patient Have a Medical Advance Directive? No No No No No  Would patient like information on creating a medical advance directive? No - Patient declined Yes (MAU/Ambulatory/Procedural Areas - Information given) - Yes (MAU/Ambulatory/Procedural Areas - Information given) -    Tobacco Social History   Tobacco Use  Smoking Status Former Smoker  . Packs/day: 2.00  . Years: 35.00  . Pack years: 70.00  . Types: Cigarettes  . Quit date: 47  . Years since quitting: 23.4  Smokeless Tobacco Never Used  Tobacco Comment   smoking cessation materials not required     Counseling given: Not Answered Comment: smoking cessation materials not required   Clinical Intake:  Pre-visit preparation completed: Yes  Pain : No/denies pain     BMI - recorded: 34.52 Nutritional Status: BMI > 30  Obese Nutritional Risks: None Diabetes: Yes CBG done?: No Did pt. bring in CBG monitor from home?: No  How often do you need to have someone help you when you read instructions, pamphlets, or other written materials from your doctor or pharmacy?: 1 - Never  Interpreter Needed?: No  Information entered by :: Clemetine Marker LPN  Past Medical History:  Diagnosis Date  . Arthritis 08/28/2016  . Atrophic kidney 01/13/2013   Last Assessment & Plan:  Atrophic and nonfunctional LEFT kidney.  Discussed at length today.  Asymptomatic. No intervention needed.  Overview:  Last Assessment & Plan:  Atrophic and nonfunctional LEFT kidney.  Discussed at length today. Asymptomatic. No intervention needed. Last Assessment & Plan:  Atrophic and nonfunctional LEFT kidney.  Discussed at length today. Asymptomatic. No intervention needed.  . Cancer ([place])   . COPD (chronic obstructive pulmonary disease) ([place]) 08/28/2016  . Diabetes mellitus type 2, uncomplicated ([place]) 99991111  . ED (erectile dysfunction) 08/28/2016  . GERD (gastroesophageal reflux disease)   . Gross hematuria 07/22/2016   Overview:  Last Assessment & Plan:  The differential diagnosis for hematuria was reviewed.  Possible etiologies include, but are not limited to urolithiasis, infection, urothelial or renal malignancies, medical renal disease, and benign idiopathic hematuria.  The workup was reviewed and he will need a urinary cytology testing, a CT urogram, and Cystourethroscopy.  He has moved to [place], [place] and would like to have all this done with a local urologist since it is an hour and a half drive. Will send records there and have the work performed locally.   > 15 minutes were spent in face to face contact with the patient, >50% of which was spent counseling about about the diagnosis and plan.  Last Assessment & Plan:  The differential diagnosis for hematuria was reviewed.  Possible etiologies include, but are not limited to urolithiasis, infection, urothelial or renal malignancies, medical renal disease, and benign idiopathic hematuria.  The workup was reviewed and he will need a urinary cytology testing,   . H/O vagotomy 10/11/2012  .  Hyperlipemia   . Hypertension 08/28/2016  . Kidney stones 08/28/2016   Last Assessment & Plan:  Can't see stone on KUB today again  Pt instructed to call or go to ER for acute flank pain since he only has one functioning kidney.  Currently no evidence of any obstructive uropathy.  Check chem 8 and call with results.  F/u one  year for KUB  . Knee contusion 10/20/2013  . Peptic ulcer 08/28/2016  . Peripheral neuropathy   . Prostate cancer ([place])   . Sarcoidosis 04/04/2015  . Status post partial gastrectomy 10/11/2012  . Thyroid nodule    Past Surgical History:  Procedure Laterality Date  . APPENDECTOMY    . CHOLECYSTECTOMY    . EYE SURGERY     bialteral cataract extraction  . GASTRECTOMY    . INSERTION PROSTATE RADIATION SEED  2002   Brachytherapy  . nasal tumor     Family History  Problem Relation Age of Onset  . Cancer Mother        lung  . Diabetes Father   . Congestive Heart Failure Father   . Cancer Brother   . Prostate cancer Neg Hx   . Kidney cancer Neg Hx    Social History   Socioeconomic History  . Marital status: Divorced    Spouse name: Not on file  . Number of children: 2  . Years of education: Not on file  . Highest education level: Bachelor's degree (e.g., BA, AB, BS)  Occupational History    Employer: [employer]  Tobacco Use  . Smoking status: Former Smoker    Packs/day: 2.00    Years: 35.00    Pack years: 70.00    Types: Cigarettes    Quit date: 1998    Years since quitting: 23.4  . Smokeless tobacco: Never Used  . Tobacco comment: smoking cessation materials not required  Substance and Sexual Activity  . Alcohol use: Yes    Comment: Occasional beer  . Drug use: No  . Sexual activity: Not Currently    Birth control/protection: None  Other Topics Concern  . Not on file  Social History Narrative  . Not on file   Social Determinants of Health   Financial Resource Strain: Low Risk   . Difficulty of Paying Living Expenses: Not hard at all  Food Insecurity: No Food Insecurity  . Worried About Charity fundraiser in the Last Year: Never true  . Ran Out of Food in the Last Year: Never true  Transportation Needs: No Transportation Needs  . Lack of Transportation (Medical): No  . Lack of Transportation (Non-Medical): No  Physical Activity: Sufficiently Active  . Days  of Exercise per Week: 7 days  . Minutes of Exercise per Session: 30 min  Stress: No Stress Concern Present  . Feeling of Stress : Not at all  Social Connections: Somewhat Isolated  . Frequency of Communication with Friends and Family: More than three times a week  . Frequency of Social Gatherings with Friends and Family: Twice a week  . Attends Religious Services: Never  . Active Member of Clubs or Organizations: Yes  . Attends Archivist Meetings: 1 to 4 times per year  . Marital Status: Divorced    Outpatient Encounter Medications as of 09/27/2019  Medication Sig  . acetaminophen (TYLENOL) 500 MG tablet Take 1,000 mg by mouth 2 (two) times daily as needed for pain.  . Cholecalciferol (GNP VITAMIN D MAXIMUM STRENGTH) 2000 units TABS Take  1 tablet (2,000 Units total) by mouth daily.  . Cyanocobalamin (B-12) 500 MCG TABS Take 1 tablet by mouth daily.  . Darolutamide (NUBEQA) 300 MG TABS Take 600 mg by mouth 2 (two) times daily. berry  . dutasteride (AVODART) 0.5 MG capsule Take 0.5 mg by mouth daily. Dr Gwenlyn Found  . leuprolide (LUPRON DEPOT, 42-MONTH,) 22.5 MG injection Inject into the muscle. Dr Gwenlyn Found- cancer center  . lisinopril (ZESTRIL) 5 MG tablet Take 1 tablet (5 mg total) by mouth daily.  . Multiple Vitamin (MULTI-VITAMINS) TABS Take by mouth.  . pantoprazole (PROTONIX) 40 MG tablet Take 1 tablet (40 mg total) by mouth 2 (two) times daily.   No facility-administered encounter medications on file as of 09/27/2019.    Activities of Daily Living In your present state of health, do you have any difficulty performing the following activities: 09/27/2019  Hearing? Y  Comment wears hearing aids  Vision? N  Difficulty concentrating or making decisions? N  Walking or climbing stairs? N  Dressing or bathing? N  Doing errands, shopping? N  Preparing Food and eating ? N  Using the Toilet? N  In the past six months, have you accidently leaked urine? N  Do you have problems with  loss of bowel control? N  Managing your Medications? N  Managing your Finances? N  Housekeeping or managing your Housekeeping? N  Some recent data might be hidden    Patient Care Team: Juline Patch, MD as PCP - General (Family Medicine) Elza Rafter, MD as Consulting Physician (Internal Medicine) Hollice Espy, MD as Consulting Physician (Urology)   Assessment:   This is a routine wellness examination for Gwyndolyn Saxon.  Exercise Activities and Dietary recommendations Current Exercise Habits: Home exercise routine, Type of exercise: strength training/weights;calisthenics, Time (Minutes): 30, Frequency (Times/Week): 7, Weekly Exercise (Minutes/Week): 210, Intensity: Mild, Exercise limited by: orthopedic condition(s)  Goals    . DIET - INCREASE WATER INTAKE     Recommend to drink at least 6-8 8oz glasses of water per day.    . Weight (lb) < 200 lb (90.7 kg)     Pt states he would like to lose weight over the next year with healthy eating and physical activity        Fall Risk Fall Risk  09/27/2019 09/21/2018 09/20/2017 03/01/2017  Falls in the past year? 0 0 No No  Number falls in past yr: 0 0 - -  Injury with Fall? 0 0 - -  Risk for fall due to : No Fall Risks - History of fall(s) -  Risk for fall due to: Comment - - slipped on deck -  Follow up Falls prevention discussed Falls prevention discussed - -   FALL RISK PREVENTION PERTAINING TO THE HOME:  Any stairs in or around the home? Yes  If so, are there any without handrails? Yes   Home free of loose throw rugs in walkways, pet beds, electrical cords, etc? Yes  Adequate lighting in your home to reduce risk of falls? Yes   ASSISTIVE DEVICES UTILIZED TO PREVENT FALLS:  Life alert? No  Use of a cane, walker or w/c? No  Grab bars in the bathroom? Yes  Shower chair or bench in shower? Yes  Elevated toilet seat or a handicapped toilet? No   DME ORDERS:  DME order needed?  No   TIMED UP AND GO:  Was the test  performed? Yes .  Length of time to ambulate 10 feet: 5 sec.   GAIT:  Appearance of gait: Gait steady and fast without the use of an assistive device.   Education: Fall risk prevention has been discussed.  Intervention(s) required? No   DME/home health order needed?  No    Depression Screen PHQ 2/9 Scores 09/27/2019 09/21/2018 09/29/2017 09/20/2017  PHQ - 2 Score 0 0 0 0  PHQ- 9 Score - - 0 0    Cognitive Function 6CIT deferred for 2021 AWV; pt has no memory issues     6CIT Screen 09/21/2018 09/20/2017  What Year? 0 points 0 points  What month? 0 points 0 points  What time? 0 points 0 points  Count back from 20 0 points 0 points  Months in reverse 0 points 0 points  Repeat phrase 0 points 0 points  Total Score 0 0    Immunization History  Administered Date(s) Administered  . Influenza, High Dose Seasonal PF 03/01/2017, 03/29/2018, 03/22/2019  . Influenza-Unspecified 03/01/2017, 03/22/2019  . PFIZER SARS-COV-2 Vaccination 08/09/2019, 08/30/2019  . Pneumococcal Conjugate-13 03/01/2017  . Pneumococcal Polysaccharide-23 03/21/2009, 04/01/2015  . Tdap 10/15/2013  . Tetanus 11/09/2006  . Zoster 05/04/2008    Qualifies for Shingles Vaccine? Yes  Zostavax completed 2010. Due for Shingrix. Education has been provided regarding the importance of this vaccine. Pt has been advised to call insurance company to determine out of pocket expense. Advised may also receive vaccine at local pharmacy or Health Dept. Verbalized acceptance and understanding.  Tdap: Up to date  Flu Vaccine: Up to date  Pneumococcal Vaccine: Up to date  Covid-19 Vaccine: Up to date  Screening Tests Health Maintenance  Topic Date Due  . FOOT EXAM  10/06/2019  . HEMOGLOBIN A1C  10/10/2019  . INFLUENZA VACCINE  12/03/2019  . OPHTHALMOLOGY EXAM  12/20/2019  . TETANUS/TDAP  10/16/2023  . COLONOSCOPY  05/04/2025  . COVID-19 Vaccine  Completed  . Hepatitis C Screening  Completed  . PNA vac Low Risk Adult   Completed   Cancer Screenings:  Colorectal Screening: Completed 03/21/10. Repeat every 10 years. Referral to GI placed today. Pt aware the office will call re: appt.   Lung Cancer Screening: (Low Dose CT Chest recommended if Age 52-80 years, 30 pack-year currently smoking OR have quit w/in 15years.) does not qualify.    Additional Screening:  Hepatitis C Screening: does qualify; Completed 06/30/17  Vision Screening: Recommended annual ophthalmology exams for early detection of glaucoma and other disorders of the eye. Is the patient up to date with their annual eye exam?  Yes  Who is the provider or what is the name of the office in which the pt attends annual eye exams? [facility] Screening: Recommended annual dental exams for proper oral hygiene  Community Resource Referral:  CRR required this visit?  No       Plan:    I have personally reviewed and addressed the Medicare Annual Wellness questionnaire and have noted the following in the patient's chart:  A. Medical and social history B. Use of alcohol, tobacco or illicit drugs  C. Current medications and supplements D. Functional ability and status E.  Nutritional status F.  Physical activity G. Advance directives H. List of other physicians I.  Hospitalizations, surgeries, and ER visits in previous 12 months J.  [place] such as hearing and vision if needed, cognitive and depression L. Referrals and appointments   In addition, I have reviewed and discussed with patient certain preventive protocols, quality metrics, and best practice recommendations. A written personalized  care plan for preventive services as well as general preventive health recommendations were provided to patient.   Signed,  Clemetine Marker, LPN Nurse Health Advisor   Nurse Notes: pt advised to schedule 6 month follow up with Dr. Ronnald Ramp for labs.

## 2019-09-27 NOTE — Patient Instructions (Signed)
Juan [name] , Thank you for taking time to come for your Medicare Wellness Visit. I appreciate your ongoing commitment to your health goals. Please review the following plan we discussed and let me know if I can assist you in the future.   Screening recommendations/referrals: Colonoscopy: done 03/21/10. Referral sent to [facility] today. They will contact you for an appointment.  Recommended yearly ophthalmology/optometry visit for glaucoma screening and checkup Recommended yearly dental visit for hygiene and checkup  Vaccinations: Influenza vaccine: done 03/22/19 Pneumococcal vaccine: done 03/01/17 Tdap vaccine: done 10/15/13 Shingles vaccine: Shingrix discussed. Please contact your pharmacy for coverage information.  Covid-19: done 08/09/19 & 08/30/19  Advanced directives: Please bring a copy of your health care power of attorney and living will to the office at your convenience once you have completed those documents.   Conditions/risks identified: Recommend healthy eating and physical activity for desired weight loss.   Next appointment: Please follow up in one year for your Medicare Annual Wellness visit.    Preventive Care 71 Years and Older, Male Preventive care refers to lifestyle choices and visits with your health care provider that can promote health and wellness. What does preventive care include?  A yearly physical exam. This is also called an annual well check.  Dental exams once or twice a year.  Routine eye exams. Ask your health care provider how often you should have your eyes checked.  Personal lifestyle choices, including:  Daily care of your teeth and gums.  Regular physical activity.  Eating a healthy diet.  Avoiding tobacco and drug use.  Limiting alcohol use.  Practicing safe sex.  Taking low doses of aspirin every day.  Taking vitamin and mineral supplements as recommended by your health care provider. What happens during an  annual well check? The services and screenings done by your health care provider during your annual well check will depend on your age, overall health, lifestyle risk factors, and family history of disease. Counseling  Your health care provider may ask you questions about your:  Alcohol use.  Tobacco use.  Drug use.  Emotional well-being.  Home and relationship well-being.  Sexual activity.  Eating habits.  History of falls.  Memory and ability to understand (cognition).  Work and work Statistician. Screening  You may have the following tests or measurements:  Height, weight, and BMI.  Blood pressure.  Lipid and cholesterol levels. These may be checked every 5 years, or more frequently if you are over 82 years old.  Skin check.  Lung cancer screening. You may have this screening every year starting at age 27 if you have a 30-pack-year history of smoking and currently smoke or have quit within the past 15 years.  Fecal occult blood test (FOBT) of the stool. You may have this test every year starting at age 39.  Flexible sigmoidoscopy or colonoscopy. You may have a sigmoidoscopy every 5 years or a colonoscopy every 10 years starting at age 38.  Prostate cancer screening. Recommendations will vary depending on your family history and other risks.  Hepatitis C blood test.  Hepatitis B blood test.  Sexually transmitted disease (STD) testing.  Diabetes screening. This is done by checking your blood sugar (glucose) after you have not eaten for a while (fasting). You may have this done every 1-3 years.  Abdominal aortic aneurysm (AAA) screening. You may need this if you are a current or former smoker.  Osteoporosis. You may be screened starting at age 37 if you are  at high risk. Talk with your health care provider about your test results, treatment options, and if necessary, the need for more tests. Vaccines  Your health care provider may recommend certain vaccines,  such as:  Influenza vaccine. This is recommended every year.  Tetanus, diphtheria, and acellular pertussis (Tdap, Td) vaccine. You may need a Td booster every 10 years.  Zoster vaccine. You may need this after age 36.  Pneumococcal 13-valent conjugate (PCV13) vaccine. One dose is recommended after age 52.  Pneumococcal polysaccharide (PPSV23) vaccine. One dose is recommended after age 7. Talk to your health care provider about which screenings and vaccines you need and how often you need them. This information is not intended to replace advice given to you by your health care provider. Make sure you discuss any questions you have with your health care provider. Document Released: 05/17/2015 Document Revised: 01/08/2016 Document Reviewed: 02/19/2015 Elsevier Interactive Patient Education  2017 [place] Prevention in the Home Falls can cause injuries. They can happen to people of all ages. There are many things you can do to make your home safe and to help prevent falls. What can I do on the outside of my home?  Regularly fix the edges of walkways and driveways and fix any cracks.  Remove anything that might make you trip as you walk through a door, such as a raised step or threshold.  Trim any bushes or trees on the path to your home.  Use bright outdoor lighting.  Clear any walking paths of anything that might make someone trip, such as rocks or tools.  Regularly check to see if handrails are loose or broken. Make sure that both sides of any steps have handrails.  Any raised decks and porches should have guardrails on the edges.  Have any leaves, snow, or ice cleared regularly.  Use sand or salt on walking paths during winter.  Clean up any spills in your garage right away. This includes oil or grease spills. What can I do in the bathroom?  Use night lights.  Install grab bars by the toilet and in the tub and shower. Do not use towel bars as grab bars.  Use  non-skid mats or decals in the tub or shower.  If you need to sit down in the shower, use a plastic, non-slip stool.  Keep the floor dry. Clean up any water that spills on the floor as soon as it happens.  Remove soap buildup in the tub or shower regularly.  Attach bath mats securely with double-sided non-slip rug tape.  Do not have throw rugs and other things on the floor that can make you trip. What can I do in the bedroom?  Use night lights.  Make sure that you have a light by your bed that is easy to reach.  Do not use any sheets or blankets that are too big for your bed. They should not hang down onto the floor.  Have a firm chair that has side arms. You can use this for support while you get dressed.  Do not have throw rugs and other things on the floor that can make you trip. What can I do in the kitchen?  Clean up any spills right away.  Avoid walking on wet floors.  Keep items that you use a lot in easy-to-reach places.  If you need to reach something above you, use a strong step stool that has a grab bar.  Keep electrical cords out of  the way.  Do not use floor polish or wax that makes floors slippery. If you must use wax, use non-skid floor wax.  Do not have throw rugs and other things on the floor that can make you trip. What can I do with my stairs?  Do not leave any items on the stairs.  Make sure that there are handrails on both sides of the stairs and use them. Fix handrails that are broken or loose. Make sure that handrails are as long as the stairways.  Check any carpeting to make sure that it is firmly attached to the stairs. Fix any carpet that is loose or worn.  Avoid having throw rugs at the top or bottom of the stairs. If you do have throw rugs, attach them to the floor with carpet tape.  Make sure that you have a light switch at the top of the stairs and the bottom of the stairs. If you do not have them, ask someone to add them for you. What  else can I do to help prevent falls?  Wear shoes that:  Do not have high heels.  Have rubber bottoms.  Are comfortable and fit you well.  Are closed at the toe. Do not wear sandals.  If you use a stepladder:  Make sure that it is fully opened. Do not climb a closed stepladder.  Make sure that both sides of the stepladder are locked into place.  Ask someone to hold it for you, if possible.  Clearly mark and make sure that you can see:  Any grab bars or handrails.  First and last steps.  Where the edge of each step is.  Use tools that help you move around (mobility aids) if they are needed. These include:  Canes.  Walkers.  Scooters.  Crutches.  Turn on the lights when you go into a dark area. Replace any light bulbs as soon as they burn out.  Set up your furniture so you have a clear path. Avoid moving your furniture around.  If any of your floors are uneven, fix them.  If there are any pets around you, be aware of where they are.  Review your medicines with your doctor. Some medicines can make you feel dizzy. This can increase your chance of falling. Ask your doctor what other things that you can do to help prevent falls. This information is not intended to replace advice given to you by your health care provider. Make sure you discuss any questions you have with your health care provider. Document Released: 02/14/2009 Document Revised: 09/26/2015 Document Reviewed: 05/25/2014 Elsevier Interactive Patient Education  2017 [employer].

## 2019-10-03 DIAGNOSIS — Z1211 Encounter for screening for malignant neoplasm of colon: Secondary | ICD-10-CM

## 2019-10-04 ENCOUNTER — Telehealth (INDEPENDENT_AMBULATORY_CARE_PROVIDER_SITE_OTHER): Payer: Self-pay | Admitting: Gastroenterology

## 2019-10-04 DIAGNOSIS — Z1211 Encounter for screening for malignant neoplasm of colon: Secondary | ICD-10-CM

## 2019-10-04 NOTE — Progress Notes (Signed)
Gastroenterology Pre-Procedure Review  Request Date: 10/30/19 Requesting Physician: Dr. Lucilla Lame  PATIENT REVIEW QUESTIONS: The patient responded to the following health history questions as indicated:    1. Are you having any GI issues? yes (GERD Takes Pantoprazole) 2. Do you have a personal history of Polyps? no 3. Do you have a family history of Colon Cancer or Polyps? no 4. Diabetes Mellitus? no 5. Joint replacements in the past 12 months?no 6. Major health problems in the past 3 months?no 7. Any artificial heart valves, MVP, or defibrillator?no    MEDICATIONS & ALLERGIES:    Patient reports the following regarding taking any anticoagulation/antiplatelet therapy:   Plavix, Coumadin, Eliquis, Xarelto, Lovenox, Pradaxa, Brilinta, or Effient? no Aspirin? no  Patient confirms/reports the following medications:  Current Outpatient Medications  Medication Sig Dispense Refill  . acetaminophen (TYLENOL) 500 MG tablet Take 1,000 mg by mouth 2 (two) times daily as needed for pain.    . Cholecalciferol (GNP VITAMIN D MAXIMUM STRENGTH) 2000 units TABS Take 1 tablet (2,000 Units total) by mouth daily. 30 each   . Cyanocobalamin (B-12) 500 MCG TABS Take 1 tablet by mouth daily. 150 tablet   . Darolutamide (NUBEQA) 300 MG TABS Take 600 mg by mouth 2 (two) times daily. berry    . dutasteride (AVODART) 0.5 MG capsule Take 0.5 mg by mouth daily. Dr Gwenlyn Found    . leuprolide (LUPRON DEPOT, 67-MONTH,) 22.5 MG injection Inject into the muscle. Dr Gwenlyn Found- cancer center    . lisinopril (ZESTRIL) 5 MG tablet Take 1 tablet (5 mg total) by mouth daily. 30 tablet 5  . Multiple Vitamin (MULTI-VITAMINS) TABS Take by mouth.    . pantoprazole (PROTONIX) 40 MG tablet Take 1 tablet (40 mg total) by mouth 2 (two) times daily. 60 tablet 5   No current facility-administered medications for this visit.    Patient confirms/reports the following allergies:  Allergies  Allergen Reactions  . Nsaids Other (See  Comments)    Hx of PUD  . Statins Rash    Hyperglycemia    No orders of the defined types were placed in this encounter.   AUTHORIZATION INFORMATION Primary Insurance: 1D#: Group #:  Secondary Insurance: 1D#: Group #:  SCHEDULE INFORMATION: Date: 10/30/19 Time: Location:[facility]

## 2019-10-06 ENCOUNTER — Encounter: Payer: Self-pay | Admitting: Family Medicine

## 2019-10-06 ENCOUNTER — Other Ambulatory Visit: Payer: Self-pay

## 2019-10-06 ENCOUNTER — Ambulatory Visit (INDEPENDENT_AMBULATORY_CARE_PROVIDER_SITE_OTHER): Payer: Medicare Other | Admitting: Family Medicine

## 2019-10-06 VITALS — BP 128/70 | HR 56 | Ht 68.0 in | Wt 228.0 lb

## 2019-10-06 DIAGNOSIS — K219 Gastro-esophageal reflux disease without esophagitis: Secondary | ICD-10-CM | POA: Diagnosis not present

## 2019-10-06 DIAGNOSIS — I700 Atherosclerosis of aorta: Secondary | ICD-10-CM

## 2019-10-06 DIAGNOSIS — E782 Mixed hyperlipidemia: Secondary | ICD-10-CM

## 2019-10-06 DIAGNOSIS — E1140 Type 2 diabetes mellitus with diabetic neuropathy, unspecified: Secondary | ICD-10-CM

## 2019-10-06 DIAGNOSIS — I10 Essential (primary) hypertension: Secondary | ICD-10-CM

## 2019-10-06 DIAGNOSIS — E669 Obesity, unspecified: Secondary | ICD-10-CM | POA: Diagnosis not present

## 2019-10-06 MED ORDER — PANTOPRAZOLE SODIUM 40 MG PO TBEC: 40.0000 mg | DELAYED_RELEASE_TABLET | Freq: Two times a day (BID) | ORAL | 5 refills | Status: DC

## 2019-10-06 MED ORDER — LISINOPRIL 5 MG PO TABS: 5.0000 mg | ORAL_TABLET | Freq: Every day | ORAL | 5 refills | Status: DC

## 2019-10-06 NOTE — Progress Notes (Signed)
Date:  10/06/2019   Name:  Juan [name]   DOB:  06/10/1950   MRN:  [id number]   Chief Complaint: Hypertension (6 month follow up. RF lisinopril.), Diabetes (FOOT EXAM- A1C), and Gastroesophageal Reflux (RF Pantoprazole. )  Hypertension This is a chronic problem. The current episode started more than 1 year ago. The problem has been gradually improving since onset. The problem is controlled. Pertinent negatives include no anxiety, blurred vision, chest pain, headaches, malaise/fatigue, neck pain, orthopnea, palpitations, peripheral edema, PND, shortness of breath or sweats. There are no associated agents to hypertension. Risk factors for coronary artery disease include dyslipidemia, male gender and obesity. Past treatments include ACE inhibitors. The current treatment provides moderate improvement. There are no compliance problems.  There is no history of angina, kidney disease, CVA, heart failure, left ventricular hypertrophy, PVD or retinopathy. There is no history of chronic renal disease or a hypertension causing med.  Diabetes He presents for his follow-up diabetic visit. His disease course has been stable. There are no hypoglycemic associated symptoms. Pertinent negatives for hypoglycemia include no dizziness, headaches, nervousness/anxiousness or sweats. Pertinent negatives for diabetes include no blurred vision, no chest pain, no fatigue, no foot paresthesias, no foot ulcerations, no polydipsia, no polyphagia, no polyuria, no visual change, no weakness and no weight loss. There are no hypoglycemic complications. Symptoms are stable. There are no diabetic complications. Pertinent negatives for diabetic complications include no autonomic neuropathy, CVA, heart disease, impotence, nephropathy, peripheral neuropathy, PVD or retinopathy. Risk factors for coronary artery disease include post-menopausal, hypertension, male sex, diabetes mellitus and obesity. Current diabetic treatment includes diet.  He is compliant with treatment all of the time. He is following a generally healthy diet. Meal planning includes avoidance of concentrated sweets. He participates in exercise intermittently.  Gastroesophageal Reflux He reports no abdominal pain, no belching, no chest pain, no choking, no coughing, no dysphagia, no early satiety, no heartburn, no hoarse voice, no nausea, no sore throat or no wheezing. The problem has been gradually improving. The symptoms are aggravated by certain foods. Pertinent negatives include no fatigue or weight loss. He has tried a PPI for the symptoms.    Lab Results  Component Value Date   CREATININE 1.22 04/11/2019   BUN 25 04/11/2019   NA 142 04/11/2019   K 4.5 04/11/2019   CL 105 04/11/2019   CO2 25 04/11/2019   Lab Results  Component Value Date   CHOL 160 10/06/2018   HDL 59 10/06/2018   LDLCALC 79 10/06/2018   TRIG 109 10/06/2018   CHOLHDL 2.7 10/06/2018   Lab Results  Component Value Date   TSH 1.467 03/01/2017   Lab Results  Component Value Date   HGBA1C 5.4 04/11/2019   Lab Results  Component Value Date   WBC 8.1 05/11/2018   HGB 13.3 05/11/2018   HCT 38.3 05/11/2018   MCV 91 05/11/2018   PLT 444 05/11/2018   Lab Results  Component Value Date   ALT 17 04/28/2018   AST 25 04/28/2018   ALKPHOS 78 04/28/2018   BILITOT 1.2 04/28/2018     Review of Systems  Constitutional: Negative for chills, fatigue, fever, malaise/fatigue and weight loss.  HENT: Negative for drooling, ear discharge, ear pain, hoarse voice and sore throat.   Eyes: Negative for blurred vision.  Respiratory: Negative for cough, choking, shortness of breath and wheezing.   Cardiovascular: Negative for chest pain, palpitations, orthopnea, leg swelling and PND.  Gastrointestinal: Negative for abdominal pain, blood in  stool, constipation, diarrhea, dysphagia, heartburn and nausea.  Endocrine: Negative for polydipsia, polyphagia and polyuria.  Genitourinary: Negative for  dysuria, frequency, hematuria, impotence and urgency.  Musculoskeletal: Negative for back pain, myalgias and neck pain.  Skin: Negative for rash.  Allergic/Immunologic: Negative for environmental allergies.  Neurological: Negative for dizziness, weakness and headaches.  Hematological: Does not bruise/bleed easily.  Psychiatric/Behavioral: Negative for suicidal ideas. The patient is not nervous/anxious.     Patient Active Problem List   Diagnosis Date Noted  . Multinodular goiter 08/29/2018  . History of sarcoidosis 06/29/2018  . Thyroid nodule 05/24/2018  . Acetonemia due to secondary diabetes ([place]) 09/29/2017  . Gastric hypersecretion 09/29/2017  . Microalbuminuria 03/31/2017  . B12 deficiency 03/01/2017  . Prostate cancer ([place]) 08/28/2016  . Obesity (BMI 30-39.9) 08/28/2016  . Kidney stones, calcium oxalate 08/28/2016  . ED (erectile dysfunction) 08/28/2016  . History of duodenal ulcer 08/28/2016  . Type 2 diabetes, controlled, with neuropathy ([place]) 08/28/2016  . History of cholelithiasis 08/28/2016  . Arthritis 08/28/2016  . Gastroesophageal reflux disease 08/28/2016  . Hyperlipidemia, unspecified 04/04/2015  . Hyperlipidemia 04/04/2015  . Vitamin D deficiency, unspecified 07/12/2013  . Vitamin D deficiency 07/12/2013  . Atrophic kidney 01/13/2013  . Status post partial gastrectomy 10/11/2012  . H/O vagotomy 10/11/2012    Allergies  Allergen Reactions  . Nsaids Other (See Comments)    Hx of PUD  . Statins Rash    Hyperglycemia    Past Surgical History:  Procedure Laterality Date  . APPENDECTOMY    . CHOLECYSTECTOMY    . EYE SURGERY     bialteral cataract extraction  . GASTRECTOMY    . INSERTION PROSTATE RADIATION SEED  2002   Brachytherapy  . nasal tumor      Social History   Tobacco Use  . Smoking status: Former Smoker    Packs/day: 2.00    Years: 35.00    Pack years: 70.00    Types: Cigarettes    Quit date: 1998    Years since quitting: 23.4  .  Smokeless tobacco: Never Used  . Tobacco comment: smoking cessation materials not required  Substance Use Topics  . Alcohol use: Yes    Comment: Occasional beer  . Drug use: No     Medication list has been reviewed and updated.  Current Meds  Medication Sig  . acetaminophen (TYLENOL) 500 MG tablet Take 1,000 mg by mouth 2 (two) times daily as needed for pain.  . Cholecalciferol (GNP VITAMIN D MAXIMUM STRENGTH) 2000 units TABS Take 1 tablet (2,000 Units total) by mouth daily.  . Cyanocobalamin (B-12) 500 MCG TABS Take 1 tablet by mouth daily.  . Darolutamide (NUBEQA) 300 MG TABS Take 600 mg by mouth 2 (two) times daily. berry  . dutasteride (AVODART) 0.5 MG capsule Take 0.5 mg by mouth daily. Dr Gwenlyn Found  . leuprolide (LUPRON DEPOT, 74-MONTH,) 22.5 MG injection Inject into the muscle. Dr Gwenlyn Found- cancer center  . lisinopril (ZESTRIL) 5 MG tablet Take 1 tablet (5 mg total) by mouth daily.  . Multiple Vitamin (MULTI-VITAMINS) TABS Take by mouth.  . pantoprazole (PROTONIX) 40 MG tablet Take 1 tablet (40 mg total) by mouth 2 (two) times daily.    PHQ 2/9 Scores 10/06/2019 09/27/2019 09/21/2018 09/29/2017  PHQ - 2 Score 0 0 0 0  PHQ- 9 Score 0 - - 0    BP Readings from Last 3 Encounters:  10/06/19 128/70  09/27/19 138/78  04/11/19 130/80    Physical Exam Vitals  and nursing note reviewed.  HENT:     Head: Normocephalic.     Right Ear: Tympanic membrane, ear canal and external ear normal.     Left Ear: Tympanic membrane, ear canal and external ear normal.     Nose: Nose normal. No congestion or rhinorrhea.     Mouth/Throat:     Mouth: Mucous membranes are moist.  Eyes:     General: No scleral icterus.       Right eye: No discharge.        Left eye: No discharge.     Conjunctiva/sclera: Conjunctivae normal.     Pupils: Pupils are equal, round, and reactive to light.  Neck:     Thyroid: No thyromegaly.     Vascular: No carotid bruit or JVD.     Trachea: No tracheal deviation.    Cardiovascular:     Rate and Rhythm: Normal rate and regular rhythm.     Pulses:          Dorsalis pedis pulses are 1+ on the right side and 1+ on the left side.       Posterior tibial pulses are 1+ on the right side and 1+ on the left side.     Heart sounds: Normal heart sounds, S1 normal and S2 normal. No murmur. No systolic murmur. No diastolic murmur. No friction rub. No gallop. No S3 or S4 sounds.   Pulmonary:     Effort: No respiratory distress.     Breath sounds: Normal breath sounds. No decreased breath sounds, wheezing, rhonchi or rales.  Abdominal:     General: Bowel sounds are normal.     Palpations: Abdomen is soft. There is no mass.     Tenderness: There is no abdominal tenderness. There is no guarding or rebound.  Musculoskeletal:        General: No tenderness. Normal range of motion.     Cervical back: Normal range of motion and neck supple.  Feet:     Right foot:     Protective Sensation: 10 sites tested. 10 sites sensed.     Skin integrity: Skin integrity normal.     Toenail Condition: Right toenails are normal.     Left foot:     Protective Sensation: 10 sites tested. 10 sites sensed.     Skin integrity: Skin integrity normal.     Toenail Condition: Left toenails are normal.  Lymphadenopathy:     Cervical: No cervical adenopathy.  Skin:    General: Skin is warm.     Capillary Refill: Capillary refill takes less than 2 seconds.     Findings: No rash.  Neurological:     Mental Status: He is alert and oriented to person, place, and time.     Cranial Nerves: No cranial nerve deficit.     Deep Tendon Reflexes: Reflexes are normal and symmetric.     Wt Readings from Last 3 Encounters:  10/06/19 228 lb (103.4 kg)  09/27/19 227 lb (103 kg)  04/11/19 228 lb (103.4 kg)    BP 128/70   Pulse (!) 56   Ht 5\' 8"  (1.727 m)   Wt 228 lb (103.4 kg)   SpO2 97%   BMI 34.67 kg/m   Assessment and Plan:  1. Essential hypertension Chronic.  Controlled.  Stable.   Continue lisinopril 5 mg once a day.  Will check renal function panel. - lisinopril (ZESTRIL) 5 MG tablet; Take 1 tablet (5 mg total) by mouth daily.  Dispense: 30  tablet; Refill: 5 - Renal Function Panel  2. Gastroesophageal reflux disease, unspecified whether esophagitis present Chronic.  Controlled.  Stable.  Continue pantoprazole 40 mg 1 twice a day. - pantoprazole (PROTONIX) 40 MG tablet; Take 1 tablet (40 mg total) by mouth 2 (two) times daily.  Dispense: 60 tablet; Refill: 5  3. Type 2 diabetes, controlled, with neuropathy (HCC) Chronic.  Controlled.  Stable.  Minimal neuropathy without any significant ischemic changes.  Will check A1c renal function panel lipid panel.  We will continue to control with dietary measures. - Hemoglobin A1c - Renal Function Panel - Lipid Panel With LDL/HDL Ratio  4. Obesity (BMI 30-39.9) Health risks of being over weight were discussed and patient was counseled on weight loss options and exercise.  5. Aortic atherosclerosis (HCC) Chronic.  Controlled.  Discussed with patient the importance of controlling blood pressure and lipids.  Patient was noted to have normal lipids without needing of statins and will continue to limit carbohydrate and fat intake. - Lipid Panel With LDL/HDL Ratio  6. Moderate mixed hyperlipidemia not requiring statin therapy As noted above this is controlled with dietary control and patient will continue with his current low-cholesterol low fat diet. - Lipid Panel With LDL/HDL Ratio

## 2019-10-07 LAB — RENAL FUNCTION PANEL
Albumin: 4.2 g/dL (ref 3.8–4.8)
BUN/Creatinine Ratio: 16 (ref 10–24)
BUN: 19 mg/dL (ref 8–27)
CO2: 21 mmol/L (ref 20–29)
Calcium: 9.5 mg/dL (ref 8.6–10.2)
Chloride: 105 mmol/L (ref 96–106)
Creatinine, Ser: 1.18 mg/dL (ref 0.76–1.27)
GFR calc Af Amer: 72 mL/min/{1.73_m2} (ref >59)
GFR calc non Af Amer: 62 mL/min/{1.73_m2} (ref >59)
Glucose: 90 mg/dL (ref 65–99)
Phosphorus: 3.3 mg/dL (ref 2.8–4.1)
Potassium: 4.5 mmol/L (ref 3.5–5.2)
Sodium: 141 mmol/L (ref 134–144)

## 2019-10-07 LAB — LIPID PANEL WITH LDL/HDL RATIO
Cholesterol, Total: 165 mg/dL (ref 100–199)
HDL: 59 mg/dL (ref >39)
LDL Chol Calc (NIH): 88 mg/dL (ref 0–99)
LDL/HDL Ratio: 1.5 ratio (ref 0.0–3.6)
Triglycerides: 100 mg/dL (ref 0–149)
VLDL Cholesterol Cal: 18 mg/dL (ref 5–40)

## 2019-10-07 LAB — HEMOGLOBIN A1C
Est. average glucose Bld gHb Est-mCnc: 111 mg/dL
Hgb A1c MFr Bld: 5.5 % (ref 4.8–5.6)

## 2019-10-19 ENCOUNTER — Other Ambulatory Visit: Payer: Self-pay

## 2019-10-19 ENCOUNTER — Encounter: Payer: Self-pay | Admitting: Gastroenterology

## 2019-10-26 ENCOUNTER — Other Ambulatory Visit: Payer: Self-pay

## 2019-10-26 ENCOUNTER — Other Ambulatory Visit
Admission: RE | Admit: 2019-10-26 | Discharge: 2019-10-26 | Disposition: A | Discharge status: Home / Self Care | Payer: Medicare Other | Source: Ambulatory Visit | Attending: Gastroenterology | Admitting: Gastroenterology

## 2019-10-26 DIAGNOSIS — Z20822 Contact with and (suspected) exposure to covid-19: Secondary | ICD-10-CM | POA: Diagnosis not present

## 2019-10-26 DIAGNOSIS — Z01812 Encounter for preprocedural laboratory examination: Secondary | ICD-10-CM | POA: Insufficient documentation

## 2019-10-26 LAB — SARS CORONAVIRUS 2 (TAT 6-24 HRS): SARS Coronavirus 2: NEGATIVE

## 2019-10-26 NOTE — Discharge Instructions (Signed)
General Anesthesia, Adult, Care After This sheet gives you information about how to care for yourself after your procedure. Your health care provider may also give you more specific instructions. If you have problems or questions, contact your health care provider. What can I expect after the procedure? After the procedure, the following side effects are common:  Pain or discomfort at the IV site.  Nausea.  Vomiting.  Sore throat.  Trouble concentrating.  Feeling cold or chills.  Weak or tired.  Sleepiness and fatigue.  Soreness and body aches. These side effects can affect parts of the body that were not involved in surgery. Follow these instructions at home:  For at least 24 hours after the procedure:  Have a responsible adult stay with you. It is important to have someone help care for you until you are awake and alert.  Rest as needed.  Do not: ? Participate in activities in which you could fall or become injured. ? Drive. ? Use heavy machinery. ? Drink alcohol. ? Take sleeping pills or medicines that cause drowsiness. ? Make important decisions or sign legal documents. ? Take care of children on your own. Eating and drinking  Follow any instructions from your health care provider about eating or drinking restrictions.  When you feel hungry, start by eating small amounts of foods that are soft and easy to digest (bland), such as toast. Gradually return to your regular diet.  Drink enough fluid to keep your urine pale yellow.  If you vomit, rehydrate by drinking water, juice, or clear broth. General instructions  If you have sleep apnea, surgery and certain medicines can increase your risk for breathing problems. Follow instructions from your health care provider about wearing your sleep device: ? Anytime you are sleeping, including during daytime naps. ? While taking prescription pain medicines, sleeping medicines, or medicines that make you drowsy.  Return to  your normal activities as told by your health care provider. Ask your health care provider what activities are safe for you.  Take over-the-counter and prescription medicines only as told by your health care provider.  If you smoke, do not smoke without supervision.  Keep all follow-up visits as told by your health care provider. This is important. Contact a health care provider if:  You have nausea or vomiting that does not get better with medicine.  You cannot eat or drink without vomiting.  You have pain that does not get better with medicine.  You are unable to pass urine.  You develop a skin rash.  You have a fever.  You have redness around your IV site that gets worse. Get help right away if:  You have difficulty breathing.  You have chest pain.  You have blood in your urine or stool, or you vomit blood. Summary  After the procedure, it is common to have a sore throat or nausea. It is also common to feel tired.  Have a responsible adult stay with you for the first 24 hours after general anesthesia. It is important to have someone help care for you until you are awake and alert.  When you feel hungry, start by eating small amounts of foods that are soft and easy to digest (bland), such as toast. Gradually return to your regular diet.  Drink enough fluid to keep your urine pale yellow.  Return to your normal activities as told by your health care provider. Ask your health care provider what activities are safe for you. This information is not   intended to replace advice given to you by your health care provider. Make sure you discuss any questions you have with your health care provider. Document Revised: 04/23/2017 Document Reviewed: 12/04/2016 Elsevier Patient Education  2020 [employer]  

## 2019-10-30 ENCOUNTER — Ambulatory Visit: Payer: Medicare Other | Admitting: Anesthesiology

## 2019-10-30 ENCOUNTER — Encounter: Payer: Self-pay | Admitting: Gastroenterology

## 2019-10-30 ENCOUNTER — Ambulatory Visit
Admission: RE | Admit: 2019-10-30 | Discharge: 2019-10-30 | Disposition: A | Discharge status: Home / Self Care | Payer: Medicare Other | Attending: Gastroenterology | Admitting: Gastroenterology

## 2019-10-30 ENCOUNTER — Other Ambulatory Visit: Payer: Self-pay

## 2019-10-30 ENCOUNTER — Encounter
Admission: RE | Disposition: A | Discharge status: Home / Self Care | Payer: Self-pay | Source: Home / Self Care | Attending: Gastroenterology

## 2019-10-30 DIAGNOSIS — Z9049 Acquired absence of other specified parts of digestive tract: Secondary | ICD-10-CM | POA: Insufficient documentation

## 2019-10-30 DIAGNOSIS — M1990 Unspecified osteoarthritis, unspecified site: Secondary | ICD-10-CM | POA: Insufficient documentation

## 2019-10-30 DIAGNOSIS — K5730 Diverticulosis of large intestine without perforation or abscess without bleeding: Secondary | ICD-10-CM | POA: Insufficient documentation

## 2019-10-30 DIAGNOSIS — Z833 Family history of diabetes mellitus: Secondary | ICD-10-CM | POA: Diagnosis not present

## 2019-10-30 DIAGNOSIS — K635 Polyp of colon: Secondary | ICD-10-CM | POA: Diagnosis not present

## 2019-10-30 DIAGNOSIS — Z87891 Personal history of nicotine dependence: Secondary | ICD-10-CM | POA: Diagnosis not present

## 2019-10-30 DIAGNOSIS — D869 Sarcoidosis, unspecified: Secondary | ICD-10-CM | POA: Diagnosis not present

## 2019-10-30 DIAGNOSIS — Z888 Allergy status to other drugs, medicaments and biological substances status: Secondary | ICD-10-CM | POA: Diagnosis not present

## 2019-10-30 DIAGNOSIS — D123 Benign neoplasm of transverse colon: Secondary | ICD-10-CM | POA: Insufficient documentation

## 2019-10-30 DIAGNOSIS — D124 Benign neoplasm of descending colon: Secondary | ICD-10-CM | POA: Diagnosis not present

## 2019-10-30 DIAGNOSIS — D122 Benign neoplasm of ascending colon: Secondary | ICD-10-CM | POA: Diagnosis not present

## 2019-10-30 DIAGNOSIS — K219 Gastro-esophageal reflux disease without esophagitis: Secondary | ICD-10-CM | POA: Insufficient documentation

## 2019-10-30 DIAGNOSIS — Z9842 Cataract extraction status, left eye: Secondary | ICD-10-CM | POA: Insufficient documentation

## 2019-10-30 DIAGNOSIS — Z8546 Personal history of malignant neoplasm of prostate: Secondary | ICD-10-CM | POA: Insufficient documentation

## 2019-10-30 DIAGNOSIS — Z9841 Cataract extraction status, right eye: Secondary | ICD-10-CM | POA: Insufficient documentation

## 2019-10-30 DIAGNOSIS — Z903 Acquired absence of stomach [part of]: Secondary | ICD-10-CM | POA: Diagnosis not present

## 2019-10-30 DIAGNOSIS — Z8711 Personal history of peptic ulcer disease: Secondary | ICD-10-CM | POA: Diagnosis not present

## 2019-10-30 DIAGNOSIS — Z87442 Personal history of urinary calculi: Secondary | ICD-10-CM | POA: Insufficient documentation

## 2019-10-30 DIAGNOSIS — Z79899 Other long term (current) drug therapy: Secondary | ICD-10-CM | POA: Insufficient documentation

## 2019-10-30 DIAGNOSIS — K640 First degree hemorrhoids: Secondary | ICD-10-CM | POA: Diagnosis not present

## 2019-10-30 DIAGNOSIS — J449 Chronic obstructive pulmonary disease, unspecified: Secondary | ICD-10-CM | POA: Diagnosis not present

## 2019-10-30 DIAGNOSIS — Z801 Family history of malignant neoplasm of trachea, bronchus and lung: Secondary | ICD-10-CM | POA: Insufficient documentation

## 2019-10-30 DIAGNOSIS — I10 Essential (primary) hypertension: Secondary | ICD-10-CM | POA: Insufficient documentation

## 2019-10-30 DIAGNOSIS — E1142 Type 2 diabetes mellitus with diabetic polyneuropathy: Secondary | ICD-10-CM | POA: Diagnosis not present

## 2019-10-30 DIAGNOSIS — Z1211 Encounter for screening for malignant neoplasm of colon: Secondary | ICD-10-CM

## 2019-10-30 DIAGNOSIS — Z8249 Family history of ischemic heart disease and other diseases of the circulatory system: Secondary | ICD-10-CM | POA: Insufficient documentation

## 2019-10-30 HISTORY — PX: POLYPECTOMY: SHX5525

## 2019-10-30 HISTORY — DX: Presence of dental prosthetic device (complete) (partial): Z97.2

## 2019-10-30 HISTORY — DX: Presence of external hearing-aid: Z97.4

## 2019-10-30 HISTORY — PX: COLONOSCOPY WITH PROPOFOL: SHX5780

## 2019-10-30 SURGERY — COLONOSCOPY WITH PROPOFOL
Anesthesia: General | Site: Rectum

## 2019-10-30 MED ORDER — GLYCOPYRROLATE 0.2 MG/ML IJ SOLN
INTRAMUSCULAR | Status: DC | PRN
  Administered 2019-10-30: .2 mg via INTRAVENOUS

## 2019-10-30 MED ORDER — PROPOFOL 10 MG/ML IV BOLUS
INTRAVENOUS | Status: DC | PRN
  Administered 2019-10-30: 50 mg via INTRAVENOUS
  Administered 2019-10-30: 20 mg via INTRAVENOUS
  Administered 2019-10-30: 100 mg via INTRAVENOUS
  Administered 2019-10-30: 20 mg via INTRAVENOUS
  Administered 2019-10-30: 50 mg via INTRAVENOUS

## 2019-10-30 MED ORDER — STERILE WATER FOR IRRIGATION IR SOLN
Status: DC | PRN
  Administered 2019-10-30: 50 mL

## 2019-10-30 MED ORDER — LIDOCAINE HCL (CARDIAC) PF 100 MG/5ML IV SOSY
PREFILLED_SYRINGE | INTRAVENOUS | Status: DC | PRN
  Administered 2019-10-30: 50 mg via INTRAVENOUS

## 2019-10-30 MED ORDER — SODIUM CHLORIDE 0.9 % IV SOLN: INTRAVENOUS | Status: DC

## 2019-10-30 MED ORDER — LACTATED RINGERS IV SOLN: INTRAVENOUS | Status: DC

## 2019-10-30 SURGICAL SUPPLY — 7 items
GOWN CVR UNV OPN BCK APRN NK (MISCELLANEOUS) ×2 IMPLANT
GOWN ISOL THUMB LOOP REG UNIV (MISCELLANEOUS) ×2
KIT ENDO PROCEDURE OLY (KITS) ×2 IMPLANT
MANIFOLD NEPTUNE II (INSTRUMENTS) ×2 IMPLANT
SNARE SHORT THROW 13M SML OVAL (MISCELLANEOUS) ×2 IMPLANT
TRAP ETRAP POLY (MISCELLANEOUS) ×2 IMPLANT
WATER STERILE IRR 250ML POUR (IV SOLUTION) ×2 IMPLANT

## 2019-10-30 NOTE — Anesthesia Postprocedure Evaluation (Signed)
Anesthesia Post Note  Patient: Juan [name]  Procedure(s) Performed: COLONOSCOPY WITH BIOPSY (N/A Rectum) POLYPECTOMY (N/A Rectum)     Patient location during evaluation: PACU Anesthesia Type: General Level of consciousness: awake and alert Pain management: pain level controlled Vital Signs Assessment: post-procedure vital signs reviewed and stable Respiratory status: spontaneous breathing Cardiovascular status: blood pressure returned to baseline Postop Assessment: no apparent nausea or vomiting, adequate PO intake and no headache Anesthetic complications: no   No complications documented.  Adele Barthel Desmen Schoffstall

## 2019-10-30 NOTE — Transfer of Care (Signed)
Immediate Anesthesia Transfer of Care Note  Patient: Juan [name]  Procedure(s) Performed: COLONOSCOPY WITH PROPOFOL (N/A Rectum) POLYPECTOMY (N/A Rectum)  Patient Location: PACU  Anesthesia Type: General  Level of Consciousness: awake, alert  and patient cooperative  Airway and Oxygen Therapy: Patient Spontanous Breathing and Patient connected to supplemental oxygen  Post-op Assessment: Post-op Vital signs reviewed, Patient's Cardiovascular Status Stable, Respiratory Function Stable, Patent Airway and No signs of Nausea or vomiting  Post-op Vital Signs: Reviewed and stable  Complications: No complications documented.

## 2019-10-30 NOTE — Anesthesia Preprocedure Evaluation (Signed)
Anesthesia Evaluation  Patient identified by MRN, date of birth, ID band Patient awake    History of Anesthesia Complications Negative for: history of anesthetic complications  Airway Mallampati: II  TM Distance: >3 FB Neck ROM: Full    Dental no notable dental hx.    Pulmonary COPD, former smoker,    Pulmonary exam normal        Cardiovascular hypertension, Pt. on medications Normal cardiovascular exam     Neuro/Psych    GI/Hepatic PUD, GERD  ,  Endo/Other    Renal/GU Renal disease (single atrophic kidney)   Prostate CA    Musculoskeletal   Abdominal   Peds  Hematology   Anesthesia Other Findings   Reproductive/Obstetrics                             Anesthesia Physical Anesthesia Plan  ASA: II  Anesthesia Plan: General   Post-op Pain Management:    Induction: Intravenous  PONV Risk Score and Plan: 2 and TIVA, Propofol infusion and Treatment may vary due to age or medical condition  Airway Management Planned: Nasal Cannula and Natural Airway  Additional Equipment: None  Intra-op Plan:   Post-operative Plan:   Informed Consent: I have reviewed the patients History and Physical, chart, labs and discussed the procedure including the risks, benefits and alternatives for the proposed anesthesia with the patient or authorized representative who has indicated his/her understanding and acceptance.       Plan Discussed with: CRNA  Anesthesia Plan Comments:         Anesthesia Quick Evaluation

## 2019-10-30 NOTE — Op Note (Signed)
[facility] Gastroenterology Patient Name: Juan [name] Procedure Date: 10/30/2019 7:34 AM MRN: [id number] Account #: [id number] Date of Birth: [birth year] Admit Type: Outpatient Age: 69 Room: [facility] OR ROOM 01 Gender: Male Note Status: Finalized Procedure:             Colonoscopy Indications:           Screening for colorectal malignant neoplasm Providers:             Lucilla Lame MD, MD Referring MD:          Juline Patch, MD (Referring MD) Medicines:             Propofol per Anesthesia Complications:         No immediate complications. Procedure:             Pre-Anesthesia Assessment:                        - Prior to the procedure, a History and Physical was                         performed, and patient medications and allergies were                         reviewed. The patient's tolerance of previous                         anesthesia was also reviewed. The risks and benefits                         of the procedure and the sedation options and risks                         were discussed with the patient. All questions were                         answered, and informed consent was obtained. Prior                         Anticoagulants: The patient has taken no previous                         anticoagulant or antiplatelet agents. ASA Grade                         Assessment: II - A patient with mild systemic disease.                         After reviewing the risks and benefits, the patient                         was deemed in satisfactory condition to undergo the                         procedure.                        After obtaining informed consent, the colonoscope was  passed under direct vision. Throughout the procedure,                         the patient's blood pressure, pulse, and oxygen                         saturations were monitored continuously. The was                         introduced through the anus and  advanced to the the                         cecum, identified by appendiceal orifice and ileocecal                         valve. The colonoscopy was performed without                         difficulty. The patient tolerated the procedure well.                         The quality of the bowel preparation was excellent. Findings:      The perianal and digital rectal examinations were normal.      A 6 mm polyp was found in the ascending colon. The polyp was sessile.       The polyp was removed with a cold snare. Resection and retrieval were       complete.      A 5 mm polyp was found in the transverse colon. The polyp was sessile.       The polyp was removed with a cold snare. Resection and retrieval were       complete.      A 5 mm polyp was found in the descending colon. The polyp was sessile.       The polyp was removed with a cold snare. Resection and retrieval were       complete.      A few small-mouthed diverticula were found in the sigmoid colon.      Non-bleeding internal hemorrhoids were found during retroflexion. The       hemorrhoids were Grade I (internal hemorrhoids that do not prolapse). Impression:            - One 6 mm polyp in the ascending colon, removed with                         a cold snare. Resected and retrieved.                        - One 5 mm polyp in the transverse colon, removed with                         a cold snare. Resected and retrieved.                        - One 5 mm polyp in the descending colon, removed with                         a cold snare. Resected and retrieved.                        -  Diverticulosis in the sigmoid colon.                        - Non-bleeding internal hemorrhoids. Recommendation:        - Discharge patient to home.                        - Resume previous diet.                        - Continue present medications.                        - Await pathology results.                        - Repeat colonoscopy in 5 years  if polyp adenoma and                         10 years if hyperplastic Procedure Code(s):     --- Professional ---                        [phone], Colonoscopy, flexible; with removal of                         tumor(s), polyp(s), or other lesion(s) by snare                         technique Diagnosis Code(s):     --- Professional ---                        Z12.11, Encounter for screening for malignant neoplasm                         of colon                        K63.5, Polyp of colon CPT copyright 2019 American Medical Association. All rights reserved. The codes documented in this report are preliminary and upon coder review may  be revised to meet current compliance requirements. Lucilla Lame MD, MD 10/30/2019 8:17:39 AM This report has been signed electronically. Number of Addenda: 0 Note Initiated On: 10/30/2019 7:34 AM Scope Withdrawal Time: 0 hours 13 minutes 22 seconds  Total Procedure Duration: 0 hours 20 minutes 55 seconds  Estimated Blood Loss:  Estimated blood loss: none.      [facility]

## 2019-10-30 NOTE — Anesthesia Procedure Notes (Signed)
Procedure Name: General with mask airway Date/Time: 10/30/2019 7:54 AM Performed by: Jeannene Patella, CRNA Pre-anesthesia Checklist: Patient identified, Emergency Drugs available, Suction available, Timeout performed and Patient being monitored Patient Re-evaluated:Patient Re-evaluated prior to induction Oxygen Delivery Method: Nasal cannula Placement Confirmation: positive ETCO2

## 2019-10-30 NOTE — H&P (Signed)
Lucilla Lame, MD [address] [place], [place] [address] Phone: [phone] Fax : [phone]  Primary Care Physician:  Juline Patch, MD Primary Gastroenterologist:  Dr. Allen Norris  Pre-Procedure History & Physical: HPI:  Juan [name] is a 69 y.o. male is here for a screening colonoscopy.   Past Medical History:  Diagnosis Date  . Arthritis 08/28/2016  . Atrophic kidney 01/13/2013   Last Assessment & Plan:  Atrophic and nonfunctional LEFT kidney.  Discussed at length today. Asymptomatic. No intervention needed.  Overview:  Last Assessment & Plan:  Atrophic and nonfunctional LEFT kidney.  Discussed at length today. Asymptomatic. No intervention needed. Last Assessment & Plan:  Atrophic and nonfunctional LEFT kidney.  Discussed at length today. Asymptomatic. No intervention needed.  . Cancer ([place])   . COPD (chronic obstructive pulmonary disease) ([place]) 08/28/2016  . Diabetes mellitus type 2, uncomplicated ([place]) 4/58/0998   diet controlled  . ED (erectile dysfunction) 08/28/2016  . GERD (gastroesophageal reflux disease)   . Gross hematuria 07/22/2016   Overview:  Last Assessment & Plan:  The differential diagnosis for hematuria was reviewed.  Possible etiologies include, but are not limited to urolithiasis, infection, urothelial or renal malignancies, medical renal disease, and benign idiopathic hematuria.  The workup was reviewed and he will need a urinary cytology testing, a CT urogram, and Cystourethroscopy.  He has moved to [place], [place] and would like to have all this done with a local urologist since it is an hour and a half drive. Will send records there and have the work performed locally.   > 15 minutes were spent in face to face contact with the patient, >50% of which was spent counseling about about the diagnosis and plan.  Last Assessment & Plan:  The differential diagnosis for hematuria was reviewed.  Possible etiologies include, but are not limited to urolithiasis,  infection, urothelial or renal malignancies, medical renal disease, and benign idiopathic hematuria.  The workup was reviewed and he will need a urinary cytology testing,   . H/O vagotomy 10/11/2012  . Hyperlipemia   . Hypertension 08/28/2016   (Pt denies, Lisinopril for kidney protection)  . Kidney stones 08/28/2016   Last Assessment & Plan:  Can't see stone on KUB today again  Pt instructed to call or go to ER for acute flank pain since he only has one functioning kidney.  Currently no evidence of any obstructive uropathy.  Check chem 8 and call with results.  F/u one year for KUB  . Knee contusion 10/20/2013  . Peptic ulcer 08/28/2016  . Peripheral neuropathy   . Prostate cancer ([place])   . Sarcoidosis 04/04/2015  . Status post partial gastrectomy 10/11/2012  . Thyroid nodule   . Wears dentures    partial upper and lower  . Wears hearing aid in both ears     Past Surgical History:  Procedure Laterality Date  . APPENDECTOMY    . CHOLECYSTECTOMY    . EYE SURGERY     bialteral cataract extraction  . GASTRECTOMY    . INSERTION PROSTATE RADIATION SEED  2002   Brachytherapy  . nasal tumor      Prior to Admission medications   Medication Sig Start Date End Date Taking? Authorizing Provider  acetaminophen (TYLENOL) 500 MG tablet Take 1,000 mg by mouth 2 (two) times daily as needed for pain.   Yes [provider]  Cholecalciferol (GNP VITAMIN D MAXIMUM STRENGTH) 2000 units TABS Take 1 tablet (2,000 Units total) by mouth  daily. 03/02/17  Yes Plonk, Gwyndolyn Saxon, MD  Cyanocobalamin (B-12) 500 MCG TABS Take 1 tablet by mouth daily. 03/02/17  Yes Plonk, Gwyndolyn Saxon, MD  Darolutamide (NUBEQA) 300 MG TABS Take 600 mg by mouth 2 (two) times daily. berry   Yes [provider]  dutasteride (AVODART) 0.5 MG capsule Take 0.5 mg by mouth daily. Dr Gwenlyn Found   Yes [provider]  leuprolide (LUPRON DEPOT, 65-MONTH,) 22.5 MG injection Inject into the muscle. Dr Gwenlyn Found- cancer center   Yes  [provider]  lisinopril (ZESTRIL) 5 MG tablet Take 1 tablet (5 mg total) by mouth daily. 10/06/19  Yes Juline Patch, MD  Multiple Vitamin (MULTI-VITAMINS) TABS Take by mouth.   Yes [provider]  pantoprazole (PROTONIX) 40 MG tablet Take 1 tablet (40 mg total) by mouth 2 (two) times daily. 10/06/19  Yes Juline Patch, MD    Allergies as of 10/04/2019 - Review Complete 10/04/2019  Allergen Reaction Noted  . Nsaids Other (See Comments) 03/16/2016  . Statins Rash 08/29/2014    Family History  Problem Relation Age of Onset  . Cancer Mother        lung  . Diabetes Father   . Congestive Heart Failure Father   . Cancer Brother   . Prostate cancer Neg Hx   . Kidney cancer Neg Hx     Social History   Socioeconomic History  . Marital status: Divorced    Spouse name: Not on file  . Number of children: 2  . Years of education: Not on file  . Highest education level: Bachelor's degree (e.g., BA, AB, BS)  Occupational History    Employer: [employer]  Tobacco Use  . Smoking status: Former Smoker    Packs/day: 2.00    Years: 35.00    Pack years: 70.00    Types: Cigarettes    Quit date: 1998    Years since quitting: 23.5  . Smokeless tobacco: Never Used  . Tobacco comment: smoking cessation materials not required  Vaping Use  . Vaping Use: Never used  Substance and Sexual Activity  . Alcohol use: Yes    Comment: Occasional beer (2-3 beers/month)  . Drug use: No  . Sexual activity: Not Currently    Birth control/protection: None  Other Topics Concern  . Not on file  Social History Narrative  . Not on file   Social Determinants of Health   Financial Resource Strain: Low Risk   . Difficulty of Paying Living Expenses: Not hard at all  Food Insecurity: No Food Insecurity  . Worried About Charity fundraiser in the Last Year: Never true  . Ran Out of Food in the Last Year: Never true  Transportation Needs: No Transportation Needs  . Lack of  Transportation (Medical): No  . Lack of Transportation (Non-Medical): No  Physical Activity: Sufficiently Active  . Days of Exercise per Week: 7 days  . Minutes of Exercise per Session: 30 min  Stress: No Stress Concern Present  . Feeling of Stress : Not at all  Social Connections: Moderately Isolated  . Frequency of Communication with Friends and Family: More than three times a week  . Frequency of Social Gatherings with Friends and Family: Twice a week  . Attends Religious Services: Never  . Active Member of Clubs or Organizations: Yes  . Attends Archivist Meetings: 1 to 4 times per year  . Marital Status: Divorced  Human resources officer Violence: Not At Risk  . Fear  of Current or Ex-Partner: No  . Emotionally Abused: No  . Physically Abused: No  . Sexually Abused: No    Review of Systems: See HPI, otherwise negative ROS  Physical Exam: BP (!) 143/80   Pulse 63   Temp 97.7 F (36.5 C) (Temporal)   Ht 5\' 8"  (1.727 m)   Wt 100.7 kg   SpO2 94%   BMI 33.75 kg/m  General:   Alert,  pleasant and cooperative in NAD Head:  Normocephalic and atraumatic. Neck:  Supple; no masses or thyromegaly. Lungs:  Clear throughout to auscultation.    Heart:  Regular rate and rhythm. Abdomen:  Soft, nontender and nondistended. Normal bowel sounds, without guarding, and without rebound.   Neurologic:  Alert and  oriented x4;  grossly normal neurologically.  Impression/Plan: Juan [name] is now here to undergo a screening colonoscopy.  Risks, benefits, and alternatives regarding colonoscopy have been reviewed with the patient.  Questions have been answered.  All parties agreeable.

## 2019-10-31 ENCOUNTER — Encounter: Payer: Self-pay | Admitting: Gastroenterology

## 2019-10-31 LAB — SURGICAL PATHOLOGY

## 2019-11-01 ENCOUNTER — Encounter: Payer: Self-pay | Admitting: Gastroenterology

## 2019-12-13 DIAGNOSIS — C61 Malignant neoplasm of prostate: Secondary | ICD-10-CM | POA: Diagnosis not present

## 2019-12-13 DIAGNOSIS — Z79899 Other long term (current) drug therapy: Secondary | ICD-10-CM | POA: Diagnosis not present

## 2019-12-13 DIAGNOSIS — E559 Vitamin D deficiency, unspecified: Secondary | ICD-10-CM | POA: Diagnosis not present

## 2019-12-13 DIAGNOSIS — Z87891 Personal history of nicotine dependence: Secondary | ICD-10-CM | POA: Diagnosis not present

## 2019-12-19 DIAGNOSIS — H34831 Tributary (branch) retinal vein occlusion, right eye, with macular edema: Secondary | ICD-10-CM | POA: Diagnosis not present

## 2020-01-12 ENCOUNTER — Other Ambulatory Visit: Payer: Self-pay

## 2020-01-31 ENCOUNTER — Other Ambulatory Visit: Payer: Medicare Other

## 2020-01-31 DIAGNOSIS — Z20822 Contact with and (suspected) exposure to covid-19: Secondary | ICD-10-CM

## 2020-02-01 LAB — SARS-COV-2, NAA 2 DAY TAT

## 2020-02-01 LAB — NOVEL CORONAVIRUS, NAA: SARS-CoV-2, NAA: NOT DETECTED

## 2020-02-02 ENCOUNTER — Ambulatory Visit
Admission: RE | Admit: 2020-02-02 | Discharge: 2020-02-02 | Disposition: A | Discharge status: Home / Self Care | Payer: Medicare Other | Source: Ambulatory Visit | Attending: Family Medicine | Admitting: Family Medicine

## 2020-02-02 ENCOUNTER — Encounter: Payer: Self-pay | Admitting: Family Medicine

## 2020-02-02 ENCOUNTER — Ambulatory Visit (INDEPENDENT_AMBULATORY_CARE_PROVIDER_SITE_OTHER): Payer: Medicare Other | Admitting: Family Medicine

## 2020-02-02 ENCOUNTER — Ambulatory Visit
Admission: RE | Admit: 2020-02-02 | Discharge: 2020-02-02 | Disposition: A | Discharge status: Home / Self Care | Payer: Medicare Other | Attending: Family Medicine | Admitting: Family Medicine

## 2020-02-02 ENCOUNTER — Other Ambulatory Visit: Payer: Self-pay

## 2020-02-02 VITALS — BP 122/82 | HR 60 | Ht 68.0 in | Wt 226.0 lb

## 2020-02-02 DIAGNOSIS — J0100 Acute maxillary sinusitis, unspecified: Secondary | ICD-10-CM

## 2020-02-02 DIAGNOSIS — Z8701 Personal history of pneumonia (recurrent): Secondary | ICD-10-CM

## 2020-02-02 DIAGNOSIS — J90 Pleural effusion, not elsewhere classified: Secondary | ICD-10-CM | POA: Diagnosis not present

## 2020-02-02 DIAGNOSIS — R059 Cough, unspecified: Secondary | ICD-10-CM | POA: Diagnosis not present

## 2020-02-02 DIAGNOSIS — J189 Pneumonia, unspecified organism: Secondary | ICD-10-CM | POA: Diagnosis not present

## 2020-02-02 MED ORDER — AZITHROMYCIN 250 MG PO TABS: ORAL_TABLET | ORAL | 0 refills | Status: DC

## 2020-02-02 NOTE — Progress Notes (Signed)
Date:  02/02/2020   Name:  Juan [name]   DOB:  15-Mar-1951   MRN:  [id number]   Chief Complaint: Sinusitis (cough and cong started last week with drainage, thought it was allergy related. )  Sinusitis This is a new problem. The current episode started in the past 7 days (past weekend). The problem has been waxing and waning since onset. There has been no fever. He is experiencing no pain. Associated symptoms include congestion, coughing, sinus pressure, sneezing and a sore throat. Pertinent negatives include no chills, diaphoresis, ear pain, headaches, hoarse voice, neck pain, shortness of breath or swollen glands. Past treatments include oral decongestants. The treatment provided no relief.    Lab Results  Component Value Date   CREATININE 1.18 10/06/2019   BUN 19 10/06/2019   NA 141 10/06/2019   K 4.5 10/06/2019   CL 105 10/06/2019   CO2 21 10/06/2019   Lab Results  Component Value Date   CHOL 165 10/06/2019   HDL 59 10/06/2019   LDLCALC 88 10/06/2019   TRIG 100 10/06/2019   CHOLHDL 2.7 10/06/2018   Lab Results  Component Value Date   TSH 1.467 03/01/2017   Lab Results  Component Value Date   HGBA1C 5.5 10/06/2019   Lab Results  Component Value Date   WBC 8.1 05/11/2018   HGB 13.3 05/11/2018   HCT 38.3 05/11/2018   MCV 91 05/11/2018   PLT 444 05/11/2018   Lab Results  Component Value Date   ALT 17 04/28/2018   AST 25 04/28/2018   ALKPHOS 78 04/28/2018   BILITOT 1.2 04/28/2018     Review of Systems  Constitutional: Negative for chills, diaphoresis and fever.  HENT: Positive for congestion, sinus pressure, sneezing and sore throat. Negative for drooling, ear discharge, ear pain, hoarse voice and postnasal drip.   Respiratory: Positive for cough. Negative for shortness of breath and wheezing.   Cardiovascular: Negative for chest pain, palpitations and leg swelling.  Gastrointestinal: Negative for abdominal pain, blood in stool, constipation, diarrhea  and nausea.  Endocrine: Negative for polydipsia.  Genitourinary: Negative for dysuria, frequency, hematuria and urgency.  Musculoskeletal: Negative for back pain, myalgias and neck pain.  Skin: Negative for rash.  Allergic/Immunologic: Negative for environmental allergies.  Neurological: Negative for dizziness and headaches.  Hematological: Does not bruise/bleed easily.  Psychiatric/Behavioral: Negative for suicidal ideas. The patient is not nervous/anxious.     Patient Active Problem List   Diagnosis Date Noted  . Encounter for screening colonoscopy   . Polyp of transverse colon   . Multinodular goiter 08/29/2018  . History of sarcoidosis 06/29/2018  . Thyroid nodule 05/24/2018  . Acetonemia due to secondary diabetes ([place]) 09/29/2017  . Gastric hypersecretion 09/29/2017  . Microalbuminuria 03/31/2017  . B12 deficiency 03/01/2017  . Prostate cancer ([place]) 08/28/2016  . Obesity (BMI 30-39.9) 08/28/2016  . Kidney stones, calcium oxalate 08/28/2016  . ED (erectile dysfunction) 08/28/2016  . History of duodenal ulcer 08/28/2016  . Type 2 diabetes, controlled, with neuropathy ([place]) 08/28/2016  . History of cholelithiasis 08/28/2016  . Arthritis 08/28/2016  . Gastroesophageal reflux disease 08/28/2016  . Hyperlipidemia, unspecified 04/04/2015  . Hyperlipidemia 04/04/2015  . Vitamin D deficiency, unspecified 07/12/2013  . Vitamin D deficiency 07/12/2013  . Atrophic kidney 01/13/2013  . Status post partial gastrectomy 10/11/2012  . H/O vagotomy 10/11/2012    Allergies  Allergen Reactions  . Nsaids Other (See Comments)    Hx of PUD  . Statins Rash  Hyperglycemia    Past Surgical History:  Procedure Laterality Date  . APPENDECTOMY    . CHOLECYSTECTOMY    . COLONOSCOPY WITH PROPOFOL N/A 10/30/2019   Procedure: COLONOSCOPY WITH BIOPSY;  Surgeon: Lucilla Lame, MD;  Location: [place];  Service: Endoscopy;  Laterality: N/A;  Diabetic - diet controlled priority 4  .  EYE SURGERY     bialteral cataract extraction  . GASTRECTOMY    . INSERTION PROSTATE RADIATION SEED  2002   Brachytherapy  . nasal tumor    . POLYPECTOMY N/A 10/30/2019   Procedure: POLYPECTOMY;  Surgeon: Lucilla Lame, MD;  Location: [place];  Service: Endoscopy;  Laterality: N/A;    Social History   Tobacco Use  . Smoking status: Former Smoker    Packs/day: 2.00    Years: 35.00    Pack years: 70.00    Types: Cigarettes    Quit date: 1998    Years since quitting: 23.7  . Smokeless tobacco: Never Used  . Tobacco comment: smoking cessation materials not required  Vaping Use  . Vaping Use: Never used  Substance Use Topics  . Alcohol use: Yes    Comment: Occasional beer (2-3 beers/month)  . Drug use: No     Medication list has been reviewed and updated.  Current Meds  Medication Sig  . acetaminophen (TYLENOL) 500 MG tablet Take 1,000 mg by mouth 2 (two) times daily as needed for pain.  . Cholecalciferol (GNP VITAMIN D MAXIMUM STRENGTH) 2000 units TABS Take 1 tablet (2,000 Units total) by mouth daily.  . Cyanocobalamin (B-12) 500 MCG TABS Take 1 tablet by mouth daily.  . Darolutamide (NUBEQA) 300 MG TABS Take 600 mg by mouth 2 (two) times daily. berry  . dutasteride (AVODART) 0.5 MG capsule Take 0.5 mg by mouth daily. Dr Gwenlyn Found  . leuprolide (LUPRON DEPOT, 74-MONTH,) 22.5 MG injection Inject into the muscle. Dr Gwenlyn Found- cancer center  . lisinopril (ZESTRIL) 5 MG tablet Take 1 tablet (5 mg total) by mouth daily.  . Multiple Vitamin (MULTI-VITAMINS) TABS Take by mouth.  . pantoprazole (PROTONIX) 40 MG tablet Take 1 tablet (40 mg total) by mouth 2 (two) times daily.    PHQ 2/9 Scores 02/02/2020 10/06/2019 09/27/2019 09/21/2018  PHQ - 2 Score 0 0 0 0  PHQ- 9 Score 0 0 - -    GAD 7 : Generalized Anxiety Score 02/02/2020 10/06/2019  Nervous, Anxious, on Edge 0 0  Control/stop worrying 0 0  Worry too much - different things 0 0  Trouble relaxing 0 0  Restless 0 0  Easily  annoyed or irritable 0 0  Afraid - awful might happen 0 0  Total GAD 7 Score 0 0  Anxiety Difficulty - Not difficult at all    BP Readings from Last 3 Encounters:  02/02/20 122/82  10/30/19 127/71  10/06/19 128/70    Physical Exam Vitals and nursing note reviewed.  HENT:     Head: Normocephalic.     Salivary Glands: Right salivary gland is not diffusely enlarged or tender. Left salivary gland is not diffusely enlarged or tender.     Right Ear: Tympanic membrane and external ear normal.     Left Ear: Tympanic membrane and external ear normal.     Nose:     Right Sinus: No maxillary sinus tenderness or frontal sinus tenderness.     Left Sinus: Maxillary sinus tenderness present. No frontal sinus tenderness.     Mouth/Throat:     Pharynx:  Oropharynx is clear. Uvula midline. No posterior oropharyngeal erythema or uvula swelling.  Eyes:     General: No scleral icterus.       Right eye: No discharge.        Left eye: No discharge.     Conjunctiva/sclera: Conjunctivae normal.     Pupils: Pupils are equal, round, and reactive to light.  Neck:     Thyroid: No thyromegaly.     Vascular: No JVD.     Trachea: No tracheal deviation.  Cardiovascular:     Rate and Rhythm: Normal rate and regular rhythm.     Heart sounds: Normal heart sounds. No murmur heard.  No friction rub. No gallop.   Pulmonary:     Effort: No respiratory distress.     Breath sounds: Normal breath sounds. No decreased breath sounds, wheezing, rhonchi or rales.  Abdominal:     General: Bowel sounds are normal.     Palpations: Abdomen is soft. There is no mass.     Tenderness: There is no abdominal tenderness. There is no guarding or rebound.  Musculoskeletal:        General: No tenderness. Normal range of motion.     Cervical back: Normal range of motion and neck supple.  Lymphadenopathy:     Head:     Right side of head: No submental, submandibular or tonsillar adenopathy.     Left side of head: No submental,  submandibular or tonsillar adenopathy.     Cervical: No cervical adenopathy.  Skin:    General: Skin is warm.     Findings: No rash.  Neurological:     Mental Status: He is alert and oriented to person, place, and time.     Cranial Nerves: No cranial nerve deficit.     Deep Tendon Reflexes: Reflexes are normal and symmetric.     Wt Readings from Last 3 Encounters:  02/02/20 226 lb (102.5 kg)  10/30/19 222 lb (100.7 kg)  10/06/19 228 lb (103.4 kg)    BP 122/82   Pulse 60   Ht 5\' 8"  (1.727 m)   Wt 226 lb (102.5 kg)   BMI 34.36 kg/m   Assessment and Plan: 1. Acute non-recurrent maxillary sinusitis Acute.  Persistent.  Stable.  We will treat with a azithromycin to 50 mg 2 today followed by 1 a day for 4 days. - azithromycin (ZITHROMAX) 250 MG tablet; 2 today then 1 a day for 4 days  Dispense: 6 tablet; Refill: 0  2. History of community acquired pneumonia Patient has a history of pneumonia.  Pulse ox is at 98%.  X-ray was obtained given his history of pneumonia - DG Chest 2 View; Future

## 2020-02-19 ENCOUNTER — Ambulatory Visit (INDEPENDENT_AMBULATORY_CARE_PROVIDER_SITE_OTHER): Payer: Medicare Other

## 2020-02-19 DIAGNOSIS — Z23 Encounter for immunization: Secondary | ICD-10-CM | POA: Diagnosis not present

## 2020-03-07 DIAGNOSIS — Z23 Encounter for immunization: Secondary | ICD-10-CM | POA: Diagnosis not present

## 2020-03-18 DIAGNOSIS — Z923 Personal history of irradiation: Secondary | ICD-10-CM | POA: Diagnosis not present

## 2020-03-18 DIAGNOSIS — Z79899 Other long term (current) drug therapy: Secondary | ICD-10-CM | POA: Diagnosis not present

## 2020-03-18 DIAGNOSIS — Z87891 Personal history of nicotine dependence: Secondary | ICD-10-CM | POA: Diagnosis not present

## 2020-03-18 DIAGNOSIS — E559 Vitamin D deficiency, unspecified: Secondary | ICD-10-CM | POA: Diagnosis not present

## 2020-03-18 DIAGNOSIS — C61 Malignant neoplasm of prostate: Secondary | ICD-10-CM | POA: Diagnosis not present

## 2020-03-19 ENCOUNTER — Ambulatory Visit (INDEPENDENT_AMBULATORY_CARE_PROVIDER_SITE_OTHER): Payer: Medicare Other | Admitting: Family Medicine

## 2020-03-19 ENCOUNTER — Encounter: Payer: Self-pay | Admitting: Family Medicine

## 2020-03-19 ENCOUNTER — Other Ambulatory Visit: Payer: Self-pay

## 2020-03-19 VITALS — BP 130/80 | HR 76 | Ht 68.0 in | Wt 226.0 lb

## 2020-03-19 DIAGNOSIS — K219 Gastro-esophageal reflux disease without esophagitis: Secondary | ICD-10-CM | POA: Diagnosis not present

## 2020-03-19 DIAGNOSIS — E1140 Type 2 diabetes mellitus with diabetic neuropathy, unspecified: Secondary | ICD-10-CM

## 2020-03-19 DIAGNOSIS — I10 Essential (primary) hypertension: Secondary | ICD-10-CM | POA: Diagnosis not present

## 2020-03-19 MED ORDER — PANTOPRAZOLE SODIUM 40 MG PO TBEC: 40.0000 mg | DELAYED_RELEASE_TABLET | Freq: Two times a day (BID) | ORAL | 1 refills | Status: DC

## 2020-03-19 MED ORDER — LISINOPRIL 5 MG PO TABS: 5.0000 mg | ORAL_TABLET | Freq: Every day | ORAL | 1 refills | Status: DC

## 2020-03-19 NOTE — Progress Notes (Addendum)
Date:  03/19/2020   Name:  Juan [name]   DOB:  [birth year]   MRN:  [id number]   Chief Complaint: Hypertension, Gastroesophageal Reflux, and Prediabetes (diet controlled- needs A1c and foot exam)  Hypertension This is a chronic problem. The current episode started more than 1 year ago. The problem has been gradually improving since onset. The problem is controlled. Pertinent negatives include no anxiety, blurred vision, chest pain, headaches, malaise/fatigue, neck pain, orthopnea, palpitations, peripheral edema, PND, shortness of breath or sweats. There are no associated agents to hypertension. The current treatment provides moderate improvement. There are no compliance problems.  There is no history of angina, kidney disease, CAD/MI, CVA, heart failure, left ventricular hypertrophy, PVD or retinopathy. There is no history of chronic renal disease, a hypertension causing med or renovascular disease.  Gastroesophageal Reflux He reports no abdominal pain, no belching, no chest pain, no choking, no coughing, no dysphagia, no early satiety, no globus sensation, no heartburn, no hoarse voice, no nausea, no sore throat, no stridor, no tooth decay, no water brash or no wheezing. This is a chronic problem. The current episode started more than 1 year ago. Pertinent negatives include no fatigue or weight loss. He has tried a PPI for the symptoms. The treatment provided moderate relief.  Diabetes He presents for his follow-up diabetic visit. Diabetes type: prediabetes. His disease course has been stable. There are no hypoglycemic associated symptoms. Pertinent negatives for hypoglycemia include no dizziness, headaches, nervousness/anxiousness or sweats. Pertinent negatives for diabetes include no blurred vision, no chest pain, no fatigue, no foot paresthesias, no foot ulcerations, no polydipsia, no polyphagia, no polyuria, no visual change, no weakness and no weight loss. There are no hypoglycemic  complications. Symptoms are stable. There are no diabetic complications. Pertinent negatives for diabetic complications include no CVA, PVD or retinopathy. There are no known risk factors for coronary artery disease. Current diabetic treatment includes diet.    Lab Results  Component Value Date   CREATININE 1.18 10/06/2019   BUN 19 10/06/2019   NA 141 10/06/2019   K 4.5 10/06/2019   CL 105 10/06/2019   CO2 21 10/06/2019   Lab Results  Component Value Date   CHOL 165 10/06/2019   HDL 59 10/06/2019   LDLCALC 88 10/06/2019   TRIG 100 10/06/2019   CHOLHDL 2.7 10/06/2018   Lab Results  Component Value Date   TSH 1.467 03/01/2017   Lab Results  Component Value Date   HGBA1C 5.5 10/06/2019   Lab Results  Component Value Date   WBC 8.1 05/11/2018   HGB 13.3 05/11/2018   HCT 38.3 05/11/2018   MCV 91 05/11/2018   PLT 444 05/11/2018   Lab Results  Component Value Date   ALT 17 04/28/2018   AST 25 04/28/2018   ALKPHOS 78 04/28/2018   BILITOT 1.2 04/28/2018     Review of Systems  Constitutional: Negative for chills, fatigue, fever, malaise/fatigue and weight loss.  HENT: Negative for drooling, ear discharge, ear pain, hoarse voice, postnasal drip and sore throat.   Eyes: Negative for blurred vision.  Respiratory: Negative for cough, choking, shortness of breath and wheezing.   Cardiovascular: Negative for chest pain, palpitations, orthopnea, leg swelling and PND.  Gastrointestinal: Negative for abdominal pain, blood in stool, constipation, diarrhea, dysphagia, heartburn and nausea.  Endocrine: Negative for polydipsia, polyphagia and polyuria.  Genitourinary: Negative for dysuria, frequency, hematuria and urgency.  Musculoskeletal: Negative for back pain, myalgias and neck pain.  Skin: Negative for  rash.  Allergic/Immunologic: Negative for environmental allergies.  Neurological: Negative for dizziness, weakness and headaches.  Hematological: Does not bruise/bleed easily.    Psychiatric/Behavioral: Negative for suicidal ideas. The patient is not nervous/anxious.     Patient Active Problem List   Diagnosis Date Noted  . Encounter for screening colonoscopy   . Polyp of transverse colon   . Multinodular goiter 08/29/2018  . History of sarcoidosis 06/29/2018  . Thyroid nodule 05/24/2018  . Acetonemia due to secondary diabetes ([place]) 09/29/2017  . Gastric hypersecretion 09/29/2017  . Microalbuminuria 03/31/2017  . B12 deficiency 03/01/2017  . Prostate cancer ([place]) 08/28/2016  . Obesity (BMI 30-39.9) 08/28/2016  . Kidney stones, calcium oxalate 08/28/2016  . ED (erectile dysfunction) 08/28/2016  . History of duodenal ulcer 08/28/2016  . Type 2 diabetes, controlled, with neuropathy ([place]) 08/28/2016  . History of cholelithiasis 08/28/2016  . Arthritis 08/28/2016  . Gastroesophageal reflux disease 08/28/2016  . Hyperlipidemia, unspecified 04/04/2015  . Hyperlipidemia 04/04/2015  . Vitamin D deficiency, unspecified 07/12/2013  . Vitamin D deficiency 07/12/2013  . Atrophic kidney 01/13/2013  . Status post partial gastrectomy 10/11/2012  . H/O vagotomy 10/11/2012    Allergies  Allergen Reactions  . Nsaids Other (See Comments)    Hx of PUD  . Statins Rash    Hyperglycemia    Past Surgical History:  Procedure Laterality Date  . APPENDECTOMY    . CHOLECYSTECTOMY    . COLONOSCOPY WITH PROPOFOL N/A 10/30/2019   Procedure: COLONOSCOPY WITH BIOPSY;  Surgeon: Lucilla Lame, MD;  Location: [facility];  Service: Endoscopy;  Laterality: N/A;  Diabetic - diet controlled priority 4  . EYE SURGERY     bialteral cataract extraction  . GASTRECTOMY    . INSERTION PROSTATE RADIATION SEED  2002   Brachytherapy  . nasal tumor    . POLYPECTOMY N/A 10/30/2019   Procedure: POLYPECTOMY;  Surgeon: Lucilla Lame, MD;  Location: [facility];  Service: Endoscopy;  Laterality: N/A;    Social History   Tobacco Use  . Smoking status: Former Smoker     Packs/day: 2.00    Years: 35.00    Pack years: 70.00    Types: Cigarettes    Quit date: 1998    Years since quitting: 23.8  . Smokeless tobacco: Never Used  . Tobacco comment: smoking cessation materials not required  Vaping Use  . Vaping Use: Never used  Substance Use Topics  . Alcohol use: Yes    Comment: Occasional beer (2-3 beers/month)  . Drug use: No     Medication list has been reviewed and updated.  Current Meds  Medication Sig  . acetaminophen (TYLENOL) 500 MG tablet Take 1,000 mg by mouth 2 (two) times daily as needed for pain.  . Cholecalciferol (GNP VITAMIN D MAXIMUM STRENGTH) 2000 units TABS Take 1 tablet (2,000 Units total) by mouth daily.  . Cyanocobalamin (B-12) 500 MCG TABS Take 1 tablet by mouth daily.  . Darolutamide (NUBEQA) 300 MG TABS Take 600 mg by mouth 2 (two) times daily. berry  . dutasteride (AVODART) 0.5 MG capsule Take 0.5 mg by mouth daily. Dr Gwenlyn Found  . leuprolide (LUPRON DEPOT, 10-MONTH,) 22.5 MG injection Inject into the muscle. Dr Gwenlyn Found- cancer center  . lisinopril (ZESTRIL) 5 MG tablet Take 1 tablet (5 mg total) by mouth daily.  . Multiple Vitamin (MULTI-VITAMINS) TABS Take by mouth.  . pantoprazole (PROTONIX) 40 MG tablet Take 1 tablet (40 mg total) by mouth 2 (two) times daily.  PHQ 2/9 Scores 03/19/2020 02/02/2020 10/06/2019 09/27/2019  PHQ - 2 Score 0 0 0 0  PHQ- 9 Score 0 0 0 -    GAD 7 : Generalized Anxiety Score 03/19/2020 02/02/2020 10/06/2019  Nervous, Anxious, on Edge 0 0 0  Control/stop worrying 0 0 0  Worry too much - different things 0 0 0  Trouble relaxing 0 0 0  Restless 0 0 0  Easily annoyed or irritable 0 0 0  Afraid - awful might happen 0 0 0  Total GAD 7 Score 0 0 0  Anxiety Difficulty - - Not difficult at all    BP Readings from Last 3 Encounters:  03/19/20 130/80  02/02/20 122/82  10/30/19 127/71    Physical Exam Vitals and nursing note reviewed.  HENT:     Head: Normocephalic.     Right Ear: Tympanic membrane,  ear canal and external ear normal. There is no impacted cerumen.     Left Ear: Tympanic membrane, ear canal and external ear normal. There is no impacted cerumen.     Nose: Nose normal. No congestion or rhinorrhea.     Mouth/Throat:     Mouth: Mucous membranes are moist.  Eyes:     General: No scleral icterus.       Right eye: No discharge.        Left eye: No discharge.     Conjunctiva/sclera: Conjunctivae normal.     Pupils: Pupils are equal, round, and reactive to light.  Neck:     Thyroid: No thyromegaly.     Vascular: No JVD.     Trachea: No tracheal deviation.  Cardiovascular:     Rate and Rhythm: Normal rate and regular rhythm.     Pulses: Normal pulses.          Carotid pulses are 2+ on the right side and 2+ on the left side.      Radial pulses are 2+ on the right side and 2+ on the left side.       Femoral pulses are 2+ on the right side and 2+ on the left side.      Popliteal pulses are 2+ on the right side and 2+ on the left side.       Dorsalis pedis pulses are 2+ on the right side and 2+ on the left side.       Posterior tibial pulses are 2+ on the right side and 2+ on the left side.     Heart sounds: Normal heart sounds, S1 normal and S2 normal. No murmur heard.  No systolic murmur is present.  No diastolic murmur is present.  No friction rub. No gallop. No S3 or S4 sounds.   Pulmonary:     Effort: No respiratory distress.     Breath sounds: Normal breath sounds. No wheezing or rales.  Abdominal:     General: Bowel sounds are normal.     Palpations: Abdomen is soft. There is no mass.     Tenderness: There is no abdominal tenderness. There is no guarding or rebound.  Musculoskeletal:        General: No tenderness. Normal range of motion.     Cervical back: Normal range of motion and neck supple.     Right lower leg: No edema.     Left lower leg: No edema.  Feet:     Right foot:     Protective Sensation: 10 sites tested. 10 sites sensed.     Skin integrity:  Erythema present. No ulcer, blister, skin breakdown, warmth, callus, dry skin or fissure.     Toenail Condition: Right toenails are normal.     Left foot:     Protective Sensation: 10 sites tested. 10 sites sensed.     Skin integrity: Erythema present. No ulcer, blister, skin breakdown, warmth, callus, dry skin or fissure.     Toenail Condition: Left toenails are normal.  Lymphadenopathy:     Cervical: No cervical adenopathy.  Skin:    General: Skin is warm.     Capillary Refill: Capillary refill takes less than 2 seconds.     Findings: No rash.  Neurological:     Mental Status: He is alert and oriented to person, place, and time.     Cranial Nerves: No cranial nerve deficit.     Sensory: Sensation is intact.     Motor: Motor function is intact.     Deep Tendon Reflexes: Reflexes are normal and symmetric.     Wt Readings from Last 3 Encounters:  03/19/20 226 lb (102.5 kg)  02/02/20 226 lb (102.5 kg)  10/30/19 222 lb (100.7 kg)    BP 130/80   Pulse 76   Ht 5\' 8"  (1.727 m)   Wt 226 lb (102.5 kg)   BMI 34.36 kg/m   Patient's chart was reviewed and most recent encounter was noted. 1. Type 2 diabetes, controlled, with neuropathy (HCC) Chronic.  Controlled.  Stable.  Currently diet controlled.  Patient was previous diagnosed diabetic until he had significant weight reduction and control with diet.  We will check an A1c microalbuminuria. - Hemoglobin A1c - Microalbumin, urine  2. Essential hypertension Chronic.  Controlled.  Stable.  Continue lisinopril 5 mg daily. - lisinopril (ZESTRIL) 5 MG tablet; Take 1 tablet (5 mg total) by mouth daily.  Dispense: 90 tablet; Refill: 1  3. Gastroesophageal reflux disease, unspecified whether esophagitis present Chronic.  Controlled.  Stable.  Continue pantoprazole 40 mg once a day. - pantoprazole (PROTONIX) 40 MG tablet; Take 1 tablet (40 mg total) by mouth 2 (two) times daily.  Dispense: 180 tablet; Refill: 1

## 2020-03-20 LAB — HEMOGLOBIN A1C
Est. average glucose Bld gHb Est-mCnc: 111 mg/dL
Hgb A1c MFr Bld: 5.5 % (ref 4.8–5.6)

## 2020-03-20 LAB — MICROALBUMIN, URINE: Microalbumin, Urine: 28.1 ug/mL

## 2020-04-12 DIAGNOSIS — E042 Nontoxic multinodular goiter: Secondary | ICD-10-CM | POA: Diagnosis not present

## 2020-04-22 DIAGNOSIS — E042 Nontoxic multinodular goiter: Secondary | ICD-10-CM | POA: Diagnosis not present

## 2020-05-07 LAB — HM DIABETES EYE EXAM

## 2020-06-19 DIAGNOSIS — C61 Malignant neoplasm of prostate: Secondary | ICD-10-CM | POA: Diagnosis not present

## 2020-06-19 DIAGNOSIS — Z192 Hormone resistant malignancy status: Secondary | ICD-10-CM | POA: Diagnosis not present

## 2020-06-19 DIAGNOSIS — Z79818 Long term (current) use of other agents affecting estrogen receptors and estrogen levels: Secondary | ICD-10-CM | POA: Diagnosis not present

## 2020-06-19 DIAGNOSIS — E559 Vitamin D deficiency, unspecified: Secondary | ICD-10-CM | POA: Diagnosis not present

## 2020-06-19 DIAGNOSIS — Z923 Personal history of irradiation: Secondary | ICD-10-CM | POA: Diagnosis not present

## 2020-06-19 DIAGNOSIS — Z87891 Personal history of nicotine dependence: Secondary | ICD-10-CM | POA: Diagnosis not present

## 2020-06-19 DIAGNOSIS — Z79899 Other long term (current) drug therapy: Secondary | ICD-10-CM | POA: Diagnosis not present

## 2020-09-14 ENCOUNTER — Other Ambulatory Visit: Payer: Self-pay | Admitting: Family Medicine

## 2020-09-14 DIAGNOSIS — I10 Essential (primary) hypertension: Secondary | ICD-10-CM

## 2020-09-14 NOTE — Telephone Encounter (Signed)
Requested Prescriptions  Pending Prescriptions Disp Refills  . lisinopril (ZESTRIL) 5 MG tablet [Pharmacy Med Name: LISINOPRIL 5 MG TABLET] 90 tablet 0    Sig: TAKE 1 TABLET BY MOUTH EVERY DAY     Cardiovascular:  ACE Inhibitors Failed - 09/14/2020  9:13 AM      Failed - Cr in normal range and within 180 days    Creatinine, Ser  Date Value Ref Range Status  10/06/2019 1.18 0.76 - 1.27 mg/dL Final         Failed - K in normal range and within 180 days    Potassium  Date Value Ref Range Status  10/06/2019 4.5 3.5 - 5.2 mmol/L Final         Passed - Patient is not pregnant      Passed - Last BP in normal range    BP Readings from Last 1 Encounters:  03/19/20 130/80         Passed - Valid encounter within last 6 months    Recent Outpatient Visits          5 months ago Type 2 diabetes, controlled, with neuropathy ([place])   [facility] Juline Patch, MD   7 months ago Acute non-recurrent maxillary sinusitis   [facility] Juline Patch, MD   11 months ago Essential hypertension   [place], Deanna C, MD   1 year ago Essential hypertension   [place], Deanna C, MD   1 year ago Essential hypertension   [place], Deanna C, MD      Future Appointments            In 4 days Juline Patch, MD [facility], [facility]

## 2020-09-16 DIAGNOSIS — C61 Malignant neoplasm of prostate: Secondary | ICD-10-CM | POA: Diagnosis not present

## 2020-09-16 DIAGNOSIS — Z79899 Other long term (current) drug therapy: Secondary | ICD-10-CM | POA: Diagnosis not present

## 2020-09-16 DIAGNOSIS — E559 Vitamin D deficiency, unspecified: Secondary | ICD-10-CM | POA: Diagnosis not present

## 2020-09-16 DIAGNOSIS — Z87891 Personal history of nicotine dependence: Secondary | ICD-10-CM | POA: Diagnosis not present

## 2020-09-18 ENCOUNTER — Other Ambulatory Visit: Payer: Self-pay

## 2020-09-18 ENCOUNTER — Ambulatory Visit (INDEPENDENT_AMBULATORY_CARE_PROVIDER_SITE_OTHER): Payer: Medicare Other | Admitting: Family Medicine

## 2020-09-18 ENCOUNTER — Encounter: Payer: Self-pay | Admitting: Family Medicine

## 2020-09-18 VITALS — BP 130/82 | HR 64 | Ht 68.0 in | Wt 223.0 lb

## 2020-09-18 DIAGNOSIS — Z789 Other specified health status: Secondary | ICD-10-CM

## 2020-09-18 DIAGNOSIS — E1140 Type 2 diabetes mellitus with diabetic neuropathy, unspecified: Secondary | ICD-10-CM | POA: Diagnosis not present

## 2020-09-18 DIAGNOSIS — K219 Gastro-esophageal reflux disease without esophagitis: Secondary | ICD-10-CM | POA: Diagnosis not present

## 2020-09-18 DIAGNOSIS — I10 Essential (primary) hypertension: Secondary | ICD-10-CM

## 2020-09-18 DIAGNOSIS — I700 Atherosclerosis of aorta: Secondary | ICD-10-CM

## 2020-09-18 DIAGNOSIS — E785 Hyperlipidemia, unspecified: Secondary | ICD-10-CM

## 2020-09-18 MED ORDER — PANTOPRAZOLE SODIUM 40 MG PO TBEC: 40.0000 mg | DELAYED_RELEASE_TABLET | Freq: Two times a day (BID) | ORAL | 1 refills | Status: DC

## 2020-09-18 MED ORDER — LISINOPRIL 5 MG PO TABS: 5.0000 mg | ORAL_TABLET | Freq: Every day | ORAL | 1 refills | Status: DC

## 2020-09-18 NOTE — Progress Notes (Signed)
Date:  09/18/2020   Name:  Juan [name]   DOB:  [birth year]   MRN:  [id number]   Chief Complaint: Gastroesophageal Reflux and Hypertension  Gastroesophageal Reflux He reports no abdominal pain, no belching, no chest pain, no choking, no coughing, no dysphagia, no early satiety, no globus sensation, no heartburn, no hoarse voice, no nausea, no sore throat, no stridor, no tooth decay, no water brash or no wheezing. This is a chronic problem. The current episode started more than 1 year ago. The problem occurs rarely. The problem has been gradually improving. Pertinent negatives include no anemia, fatigue, melena, muscle weakness or orthopnea. He has tried a PPI for the symptoms. The treatment provided moderate relief.  Hypertension This is a chronic problem. The current episode started more than 1 year ago. The problem has been gradually improving since onset. Pertinent negatives include no anxiety, blurred vision, chest pain, headaches, malaise/fatigue, neck pain, orthopnea, palpitations, peripheral edema, PND, shortness of breath or sweats. There are no associated agents to hypertension. Risk factors for coronary artery disease include dyslipidemia. Past treatments include ACE inhibitors. The current treatment provides moderate improvement. There are no compliance problems.  There is no history of angina, kidney disease, CAD/MI, CVA, heart failure, left ventricular hypertrophy, PVD or retinopathy. There is no history of chronic renal disease, a hypertension causing med or renovascular disease.  Diabetes He has type 2 diabetes mellitus. His disease course has been stable. Pertinent negatives for hypoglycemia include no dizziness, headaches, nervousness/anxiousness or sweats. Associated symptoms include foot paresthesias. Pertinent negatives for diabetes include no blurred vision, no chest pain, no fatigue, no foot ulcerations, no polydipsia, no polyphagia, no polyuria and no visual change. There are  no hypoglycemic complications. Symptoms are stable. There are no diabetic complications. Pertinent negatives for diabetic complications include no CVA, heart disease, PVD or retinopathy. Current diabetic treatment includes diet.  Hyperlipidemia This is a chronic problem. The current episode started more than 1 year ago. The problem is controlled. Recent lipid tests were reviewed and are normal. Exacerbating diseases include obesity. He has no history of chronic renal disease. Pertinent negatives include no chest pain, myalgias or shortness of breath. Current antihyperlipidemic treatment includes statins.    Lab Results  Component Value Date   CREATININE 1.18 10/06/2019   BUN 19 10/06/2019   NA 141 10/06/2019   K 4.5 10/06/2019   CL 105 10/06/2019   CO2 21 10/06/2019   Lab Results  Component Value Date   CHOL 165 10/06/2019   HDL 59 10/06/2019   LDLCALC 88 10/06/2019   TRIG 100 10/06/2019   CHOLHDL 2.7 10/06/2018   Lab Results  Component Value Date   TSH 1.467 03/01/2017   Lab Results  Component Value Date   HGBA1C 5.5 03/19/2020   Lab Results  Component Value Date   WBC 8.1 05/11/2018   HGB 13.3 05/11/2018   HCT 38.3 05/11/2018   MCV 91 05/11/2018   PLT 444 05/11/2018   Lab Results  Component Value Date   ALT 17 04/28/2018   AST 25 04/28/2018   ALKPHOS 78 04/28/2018   BILITOT 1.2 04/28/2018     Review of Systems  Constitutional: Negative for chills, fatigue, fever and malaise/fatigue.  HENT: Negative for drooling, ear discharge, ear pain, hoarse voice, mouth sores, nosebleeds and sore throat.   Eyes: Negative for blurred vision.  Respiratory: Negative for cough, choking, shortness of breath and wheezing.   Cardiovascular: Negative for chest pain, palpitations, orthopnea, leg swelling  and PND.  Gastrointestinal: Negative for abdominal pain, blood in stool, constipation, diarrhea, dysphagia, heartburn, melena and nausea.  Endocrine: Negative for polydipsia,  polyphagia and polyuria.  Genitourinary: Negative for dysuria, frequency, hematuria and urgency.  Musculoskeletal: Negative for back pain, myalgias, muscle weakness and neck pain.  Skin: Negative for rash.  Allergic/Immunologic: Negative for environmental allergies.  Neurological: Negative for dizziness and headaches.  Hematological: Does not bruise/bleed easily.  Psychiatric/Behavioral: Negative for suicidal ideas. The patient is not nervous/anxious.     Patient Active Problem List   Diagnosis Date Noted  . Encounter for screening colonoscopy   . Polyp of transverse colon   . Multinodular goiter 08/29/2018  . History of sarcoidosis 06/29/2018  . Thyroid nodule 05/24/2018  . Acetonemia due to secondary diabetes ([place]) 09/29/2017  . Gastric hypersecretion 09/29/2017  . Microalbuminuria 03/31/2017  . B12 deficiency 03/01/2017  . Prostate cancer ([place]) 08/28/2016  . Obesity (BMI 30-39.9) 08/28/2016  . Kidney stones, calcium oxalate 08/28/2016  . ED (erectile dysfunction) 08/28/2016  . History of duodenal ulcer 08/28/2016  . Type 2 diabetes, controlled, with neuropathy ([place]) 08/28/2016  . History of cholelithiasis 08/28/2016  . Arthritis 08/28/2016  . Gastroesophageal reflux disease 08/28/2016  . Hyperlipidemia, unspecified 04/04/2015  . Hyperlipidemia 04/04/2015  . Vitamin D deficiency, unspecified 07/12/2013  . Vitamin D deficiency 07/12/2013  . Atrophic kidney 01/13/2013  . Status post partial gastrectomy 10/11/2012  . H/O vagotomy 10/11/2012    Allergies  Allergen Reactions  . Nsaids Other (See Comments)    Hx of PUD  . Statins Rash    Hyperglycemia    Past Surgical History:  Procedure Laterality Date  . APPENDECTOMY    . CHOLECYSTECTOMY    . COLONOSCOPY WITH PROPOFOL N/A 10/30/2019   Procedure: COLONOSCOPY WITH BIOPSY;  Surgeon: Lucilla Lame, MD;  Location: [facility];  Service: Endoscopy;  Laterality: N/A;  Diabetic - diet controlled priority 4  . EYE  SURGERY     bialteral cataract extraction  . GASTRECTOMY    . INSERTION PROSTATE RADIATION SEED  2002   Brachytherapy  . nasal tumor    . POLYPECTOMY N/A 10/30/2019   Procedure: POLYPECTOMY;  Surgeon: Lucilla Lame, MD;  Location: [facility];  Service: Endoscopy;  Laterality: N/A;    Social History   Tobacco Use  . Smoking status: Former Smoker    Packs/day: 2.00    Years: 35.00    Pack years: 70.00    Types: Cigarettes    Quit date: 1998    Years since quitting: 24.3  . Smokeless tobacco: Never Used  . Tobacco comment: smoking cessation materials not required  Vaping Use  . Vaping Use: Never used  Substance Use Topics  . Alcohol use: Yes    Comment: Occasional beer (2-3 beers/month)  . Drug use: No     Medication list has been reviewed and updated.  Current Meds  Medication Sig  . acetaminophen (TYLENOL) 500 MG tablet Take 1,000 mg by mouth 2 (two) times daily as needed for pain.  . Cholecalciferol (GNP VITAMIN D MAXIMUM STRENGTH) 2000 units TABS Take 1 tablet (2,000 Units total) by mouth daily.  . Cyanocobalamin (B-12) 500 MCG TABS Take 1 tablet by mouth daily.  . darolutamide (NUBEQA) 300 MG tablet Take 600 mg by mouth 2 (two) times daily. berry  . dutasteride (AVODART) 0.5 MG capsule Take 0.5 mg by mouth daily. Dr Gwenlyn Found  . leuprolide (LUPRON) 22.5 MG injection Inject into the muscle. Dr Gwenlyn Found- cancer center  .  lisinopril (ZESTRIL) 5 MG tablet TAKE 1 TABLET BY MOUTH EVERY DAY  . Multiple Vitamin (MULTI-VITAMINS) TABS Take by mouth.  . pantoprazole (PROTONIX) 40 MG tablet Take 1 tablet (40 mg total) by mouth 2 (two) times daily.    PHQ 2/9 Scores 09/18/2020 03/19/2020 02/02/2020 10/06/2019  PHQ - 2 Score 0 0 0 0  PHQ- 9 Score 0 0 0 0    GAD 7 : Generalized Anxiety Score 09/18/2020 03/19/2020 02/02/2020 10/06/2019  Nervous, Anxious, on Edge 0 0 0 0  Control/stop worrying 0 0 0 0  Worry too much - different things 0 0 0 0  Trouble relaxing 0 0 0 0  Restless 0 0  0 0  Easily annoyed or irritable 0 0 0 0  Afraid - awful might happen 0 0 0 0  Total GAD 7 Score 0 0 0 0  Anxiety Difficulty - - - Not difficult at all    BP Readings from Last 3 Encounters:  09/18/20 130/82  03/19/20 130/80  02/02/20 122/82    Physical Exam Vitals and nursing note reviewed.  HENT:     Head: Normocephalic.     Right Ear: Tympanic membrane and external ear normal.     Left Ear: Tympanic membrane and external ear normal.     Nose: Nose normal. No congestion or rhinorrhea.     Mouth/Throat:     Mouth: Mucous membranes are moist.  Eyes:     General: No scleral icterus.       Right eye: No discharge.        Left eye: No discharge.     Conjunctiva/sclera: Conjunctivae normal.     Pupils: Pupils are equal, round, and reactive to light.  Neck:     Thyroid: No thyromegaly.     Vascular: No JVD.     Trachea: No tracheal deviation.  Cardiovascular:     Rate and Rhythm: Normal rate and regular rhythm.     Heart sounds: Normal heart sounds, S1 normal and S2 normal. No murmur heard.  No systolic murmur is present.  No diastolic murmur is present. No friction rub. No gallop. No S3 or S4 sounds.   Pulmonary:     Effort: No respiratory distress.     Breath sounds: Normal breath sounds. No wheezing, rhonchi or rales.  Abdominal:     General: Bowel sounds are normal.     Palpations: Abdomen is soft. There is no mass.     Tenderness: There is no abdominal tenderness. There is no guarding or rebound.  Musculoskeletal:        General: No tenderness. Normal range of motion.     Cervical back: Normal range of motion and neck supple.     Right lower leg: No edema.     Left lower leg: No edema.  Lymphadenopathy:     Cervical: No cervical adenopathy.  Skin:    General: Skin is warm.     Findings: No rash.  Neurological:     Mental Status: He is alert and oriented to person, place, and time.     Cranial Nerves: No cranial nerve deficit.     Deep Tendon Reflexes:  Reflexes are normal and symmetric.     Wt Readings from Last 3 Encounters:  09/18/20 223 lb (101.2 kg)  03/19/20 226 lb (102.5 kg)  02/02/20 226 lb (102.5 kg)    BP 130/82   Pulse 64   Ht 5\' 8"  (1.727 m)   Wt 223 lb (101.2  kg)   BMI 33.91 kg/m   Assessment and Plan:  1. Type 2 diabetes, controlled, with neuropathy (HCC) Chronic.  Controlled.  Stable.  Patient is using diet only to control his diabetes.  We will check an A1c to see the current level of control. - HgB A1c  2. Essential hypertension Chronic.  Controlled.  Stable.  Today's blood pressure is 130/82.  Continue lisinopril 5 mg once a day. - lisinopril (ZESTRIL) 5 MG tablet; Take 1 tablet (5 mg total) by mouth daily.  Dispense: 90 tablet; Refill: 1  3. Gastroesophageal reflux disease, unspecified whether esophagitis present Chronic.  Controlled.  Stable.  Continue pantoprazole 40 mg once a day. - pantoprazole (PROTONIX) 40 MG tablet; Take 1 tablet (40 mg total) by mouth 2 (two) times daily.  Dispense: 180 tablet; Refill: 1  4. Hyperlipidemia, unspecified hyperlipidemia type Chronic.  Controlled.  Stable.  Continue with diet control only.  Patient is unable to take statins due to intolerance to - Lipid Panel With LDL/HDL Ratio  5. Aortic atherosclerosis ([place]) Patient has history of aortic atherosclerosis which is controlled with weight control, lipid control, blood pressure control, and diabetic control.  6.  Statin intolerance.  Patient has inability to take statin because it makes his diabetes worse.  I explained to them that there is probably some other things that we can do a statin but he is very reluctant to try but I have told him that there is medication that is oral and can be used to bring cholesterol down call Zetia and that this may be an option.

## 2020-09-18 NOTE — Patient Instructions (Signed)
Trigger Finger  Trigger finger, also called stenosing tenosynovitis,  is a condition that causes a finger to get stuck in a bent position. Each finger has a tendon, which is a tough, cord-like tissue that connects muscle to bone, and each tendon passes through a tunnel of tissue called a tendon sheath. To move your finger, your tendon needs to glide freely through the sheath. Trigger finger happens when the tendon or the sheath thickens, making it difficult to move your finger. Trigger finger can affect any finger or a thumb. It may affect more than one finger. Mild cases may clear up with rest and medicine. Severe cases require more treatment. What are the causes? Trigger finger is caused by a thickened finger tendon or tendon sheath. The cause of this thickening is not known. What increases the risk? The following factors may make you more likely to develop this condition:  Doing activities that require a strong grip.  Having rheumatoid arthritis, gout, or diabetes.  Being 58-108 years old.  Being male. What are the signs or symptoms? Symptoms of this condition include:  Pain when bending or straightening your finger.  Tenderness or swelling where your finger attaches to the palm of your hand.  A lump in the palm of your hand or on the inside of your finger.  Hearing a noise like a pop or a snap when you try to straighten your finger.  Feeling a catching or locking sensation when you try to straighten your finger.  Being unable to straighten your finger. How is this diagnosed? This condition is diagnosed based on your symptoms and a physical exam. How is this treated? This condition may be treated by:  Resting your finger and avoiding activities that make symptoms worse.  Wearing a finger splint to keep your finger extended.  Taking NSAIDs, such as ibuprofen, to relieve pain and swelling.  Doing gentle exercises to stretch the finger as told by your health care  provider.  Having medicine that reduces swelling and inflammation (steroids) injected into the tendon sheath. Injections may need to be repeated.  Having surgery to open the tendon sheath. This may be done if other treatments do not work and you cannot straighten your finger. You may need physical therapy after surgery. Follow these instructions at home: If you have a splint:  Wear the splint as told by your health care provider. Remove it only as told by your health care provider.  Loosen it if your fingers tingle, become numb, or turn cold and blue.  Keep it clean.  If the splint is not waterproof: ? Do not let it get wet. ? Cover it with a watertight covering when you take a bath or shower. Managing pain, stiffness, and swelling If directed, apply heat to the affected area as often as told by your health care provider. Use the heat source that your health care provider recommends, such as a moist heat pack or a heating pad.  Place a towel between your skin and the heat source.  Leave the heat on for 20-30 minutes.  Remove the heat if your skin turns bright red. This is especially important if you are unable to feel pain, heat, or cold. You may have a greater risk of getting burned. If directed, put ice on the painful area. To do this:  If you have a removable splint, remove it as told by your health care provider.  Put ice in a plastic bag.  Place a towel between your skin  and the bag or between your splint and the bag.  Leave the ice on for 20 minutes, 2-3 times a day.      Activity  Rest your finger as told by your health care provider. Avoid activities that make the pain worse.  Return to your normal activities as told by your health care provider. Ask your health care provider what activities are safe for you.  Do exercises as told by your health care provider.  Ask your health care provider when it is safe to drive if you have a splint on your hand. General  instructions  Take over-the-counter and prescription medicines only as told by your health care provider.  Keep all follow-up visits as told by your health care provider. This is important. Contact a health care provider if:  Your symptoms are not improving with home care. Summary  Trigger finger, also called stenosing tenosynovitis, causes your finger to get stuck in a bent position. This can make it difficult and painful to straighten your finger.  This condition develops when a finger tendon or tendon sheath thickens.  Treatment may include resting your finger, wearing a splint, and taking medicines.  In severe cases, surgery to open the tendon sheath may be needed. This information is not intended to replace advice given to you by your health care provider. Make sure you discuss any questions you have with your health care provider. Document Revised: 09/05/2018 Document Reviewed: 09/05/2018 Elsevier Patient Education  [place].

## 2020-09-19 LAB — LIPID PANEL WITH LDL/HDL RATIO
Cholesterol, Total: 156 mg/dL (ref 100–199)
HDL: 55 mg/dL (ref >39)
LDL Chol Calc (NIH): 81 mg/dL (ref 0–99)
LDL/HDL Ratio: 1.5 ratio (ref 0.0–3.6)
Triglycerides: 110 mg/dL (ref 0–149)
VLDL Cholesterol Cal: 20 mg/dL (ref 5–40)

## 2020-09-19 LAB — HEMOGLOBIN A1C
Est. average glucose Bld gHb Est-mCnc: 114 mg/dL
Hgb A1c MFr Bld: 5.6 % (ref 4.8–5.6)

## 2020-10-02 ENCOUNTER — Ambulatory Visit (INDEPENDENT_AMBULATORY_CARE_PROVIDER_SITE_OTHER): Payer: Medicare Other

## 2020-10-02 ENCOUNTER — Other Ambulatory Visit: Payer: Self-pay

## 2020-10-02 VITALS — BP 138/82 | HR 57 | Temp 98.4°F | Resp 16 | Ht 68.0 in | Wt 227.6 lb

## 2020-10-02 DIAGNOSIS — Z0000 Encounter for general adult medical examination without abnormal findings: Secondary | ICD-10-CM

## 2020-10-02 NOTE — Patient Instructions (Signed)
Juan [name] , Thank you for taking time to come for your Medicare Wellness Visit. I appreciate your ongoing commitment to your health goals. Please review the following plan we discussed and let me know if I can assist you in the future.   Screening recommendations/referrals: Colonoscopy: done 10/30/19. Repeat in 2026 Recommended yearly ophthalmology/optometry visit for glaucoma screening and checkup Recommended yearly dental visit for hygiene and checkup  Vaccinations: Influenza vaccine: done 02/19/20 Pneumococcal vaccine: done 03/01/17 Tdap vaccine: done 10/15/13 Shingles vaccine: Shingrix discussed. Please contact your pharmacy for coverage information.  Covid-19:  Done 08/09/19, 08/30/19 & 03/07/20  Advanced directives: Please bring a copy of your health care power of attorney and living will to the office at your convenience once you have completed those documents.   Conditions/risks identified: Keep up the great work!  Next appointment: Follow up in one year for your annual wellness visit.   Preventive Care 39 Years and Older, Male Preventive care refers to lifestyle choices and visits with your health care provider that can promote health and wellness. What does preventive care include?  A yearly physical exam. This is also called an annual well check.  Dental exams once or twice a year.  Routine eye exams. Ask your health care provider how often you should have your eyes checked.  Personal lifestyle choices, including:  Daily care of your teeth and gums.  Regular physical activity.  Eating a healthy diet.  Avoiding tobacco and drug use.  Limiting alcohol use.  Practicing safe sex.  Taking low doses of aspirin every day.  Taking vitamin and mineral supplements as recommended by your health care provider. What happens during an annual well check? The services and screenings done by your health care provider during your annual well check will depend on your age,  overall health, lifestyle risk factors, and family history of disease. Counseling  Your health care provider may ask you questions about your:  Alcohol use.  Tobacco use.  Drug use.  Emotional well-being.  Home and relationship well-being.  Sexual activity.  Eating habits.  History of falls.  Memory and ability to understand (cognition).  Work and work Statistician. Screening  You may have the following tests or measurements:  Height, weight, and BMI.  Blood pressure.  Lipid and cholesterol levels. These may be checked every 5 years, or more frequently if you are over 36 years old.  Skin check.  Lung cancer screening. You may have this screening every year starting at age 86 if you have a 30-pack-year history of smoking and currently smoke or have quit within the past 15 years.  Fecal occult blood test (FOBT) of the stool. You may have this test every year starting at age 92.  Flexible sigmoidoscopy or colonoscopy. You may have a sigmoidoscopy every 5 years or a colonoscopy every 10 years starting at age 39.  Prostate cancer screening. Recommendations will vary depending on your family history and other risks.  Hepatitis C blood test.  Hepatitis B blood test.  Sexually transmitted disease (STD) testing.  Diabetes screening. This is done by checking your blood sugar (glucose) after you have not eaten for a while (fasting). You may have this done every 1-3 years.  Abdominal aortic aneurysm (AAA) screening. You may need this if you are a current or former smoker.  Osteoporosis. You may be screened starting at age 19 if you are at high risk. Talk with your health care provider about your test results, treatment options, and if necessary,  the need for more tests. Vaccines  Your health care provider may recommend certain vaccines, such as:  Influenza vaccine. This is recommended every year.  Tetanus, diphtheria, and acellular pertussis (Tdap, Td) vaccine. You may  need a Td booster every 10 years.  Zoster vaccine. You may need this after age 13.  Pneumococcal 13-valent conjugate (PCV13) vaccine. One dose is recommended after age 29.  Pneumococcal polysaccharide (PPSV23) vaccine. One dose is recommended after age 7. Talk to your health care provider about which screenings and vaccines you need and how often you need them. This information is not intended to replace advice given to you by your health care provider. Make sure you discuss any questions you have with your health care provider. Document Released: 05/17/2015 Document Revised: 01/08/2016 Document Reviewed: 02/19/2015 Elsevier Interactive Patient Education  2017 [employer] Prevention in the Home Falls can cause injuries. They can happen to people of all ages. There are many things you can do to make your home safe and to help prevent falls. What can I do on the outside of my home?  Regularly fix the edges of walkways and driveways and fix any cracks.  Remove anything that might make you trip as you walk through a door, such as a raised step or threshold.  Trim any bushes or trees on the path to your home.  Use bright outdoor lighting.  Clear any walking paths of anything that might make someone trip, such as rocks or tools.  Regularly check to see if handrails are loose or broken. Make sure that both sides of any steps have handrails.  Any raised decks and porches should have guardrails on the edges.  Have any leaves, snow, or ice cleared regularly.  Use sand or salt on walking paths during winter.  Clean up any spills in your garage right away. This includes oil or grease spills. What can I do in the bathroom?  Use night lights.  Install grab bars by the toilet and in the tub and shower. Do not use towel bars as grab bars.  Use non-skid mats or decals in the tub or shower.  If you need to sit down in the shower, use a plastic, non-slip stool.  Keep the floor  dry. Clean up any water that spills on the floor as soon as it happens.  Remove soap buildup in the tub or shower regularly.  Attach bath mats securely with double-sided non-slip rug tape.  Do not have throw rugs and other things on the floor that can make you trip. What can I do in the bedroom?  Use night lights.  Make sure that you have a light by your bed that is easy to reach.  Do not use any sheets or blankets that are too big for your bed. They should not hang down onto the floor.  Have a firm chair that has side arms. You can use this for support while you get dressed.  Do not have throw rugs and other things on the floor that can make you trip. What can I do in the kitchen?  Clean up any spills right away.  Avoid walking on wet floors.  Keep items that you use a lot in easy-to-reach places.  If you need to reach something above you, use a strong step stool that has a grab bar.  Keep electrical cords out of the way.  Do not use floor polish or wax that makes floors slippery. If you must use  wax, use non-skid floor wax.  Do not have throw rugs and other things on the floor that can make you trip. What can I do with my stairs?  Do not leave any items on the stairs.  Make sure that there are handrails on both sides of the stairs and use them. Fix handrails that are broken or loose. Make sure that handrails are as long as the stairways.  Check any carpeting to make sure that it is firmly attached to the stairs. Fix any carpet that is loose or worn.  Avoid having throw rugs at the top or bottom of the stairs. If you do have throw rugs, attach them to the floor with carpet tape.  Make sure that you have a light switch at the top of the stairs and the bottom of the stairs. If you do not have them, ask someone to add them for you. What else can I do to help prevent falls?  Wear shoes that:  Do not have high heels.  Have rubber bottoms.  Are comfortable and fit you  well.  Are closed at the toe. Do not wear sandals.  If you use a stepladder:  Make sure that it is fully opened. Do not climb a closed stepladder.  Make sure that both sides of the stepladder are locked into place.  Ask someone to hold it for you, if possible.  Clearly mark and make sure that you can see:  Any grab bars or handrails.  First and last steps.  Where the edge of each step is.  Use tools that help you move around (mobility aids) if they are needed. These include:  Canes.  Walkers.  Scooters.  Crutches.  Turn on the lights when you go into a dark area. Replace any light bulbs as soon as they burn out.  Set up your furniture so you have a clear path. Avoid moving your furniture around.  If any of your floors are uneven, fix them.  If there are any pets around you, be aware of where they are.  Review your medicines with your doctor. Some medicines can make you feel dizzy. This can increase your chance of falling. Ask your doctor what other things that you can do to help prevent falls. This information is not intended to replace advice given to you by your health care provider. Make sure you discuss any questions you have with your health care provider. Document Released: 02/14/2009 Document Revised: 09/26/2015 Document Reviewed: 05/25/2014 Elsevier Interactive Patient Education  2017 [employer].

## 2020-10-02 NOTE — Progress Notes (Signed)
Subjective:   Juan [name] is a 70 y.o. male who presents for Medicare Annual/Subsequent preventive examination.  Review of Systems     Cardiac Risk Factors include: advanced age (>48men, >54 women);male gender;dyslipidemia;hypertension;obesity (BMI >30kg/m2)     Objective:    Today's Vitals   10/02/20 1331 10/02/20 1333  BP: 138/82   Pulse: (!) 57   Resp: 16   Temp: 98.4 F (36.9 C)   TempSrc: Oral   SpO2: 97%   Weight: 227 lb 9.6 oz (103.2 kg)   Height: 5\' 8"  (1.727 m)   PainSc:  3    Body mass index is 34.61 kg/m.  Advanced Directives 10/02/2020 10/30/2019 09/27/2019 09/21/2018 04/28/2018 09/20/2017 03/01/2017  Does Patient Have a Medical Advance Directive? No No No No No No No  Would patient like information on creating a medical advance directive? No - Patient declined No - Patient declined No - Patient declined Yes (MAU/Ambulatory/Procedural Areas - Information given) - Yes (MAU/Ambulatory/Procedural Areas - Information given) -    Current Medications (verified) Outpatient Encounter Medications as of 10/02/2020  Medication Sig  . acetaminophen (TYLENOL) 500 MG tablet Take 1,000 mg by mouth 2 (two) times daily as needed for pain.  . Cholecalciferol (GNP VITAMIN D MAXIMUM STRENGTH) 2000 units TABS Take 1 tablet (2,000 Units total) by mouth daily.  . Cyanocobalamin (B-12) 500 MCG TABS Take 1 tablet by mouth daily.  . darolutamide (NUBEQA) 300 MG tablet Take 600 mg by mouth 2 (two) times daily. berry  . dutasteride (AVODART) 0.5 MG capsule Take 0.5 mg by mouth daily. Dr Gwenlyn Found  . leuprolide (LUPRON) 22.5 MG injection Inject into the muscle. Dr Gwenlyn Found- cancer center  . lisinopril (ZESTRIL) 5 MG tablet Take 1 tablet (5 mg total) by mouth daily.  . Multiple Vitamin (MULTI-VITAMINS) TABS Take by mouth.  . pantoprazole (PROTONIX) 40 MG tablet Take 1 tablet (40 mg total) by mouth 2 (two) times daily.   No facility-administered encounter medications on file as of 10/02/2020.     Allergies (verified) Nsaids and Statins   History: Past Medical History:  Diagnosis Date  . Arthritis 08/28/2016  . Atrophic kidney 01/13/2013   Last Assessment & Plan:  Atrophic and nonfunctional LEFT kidney.  Discussed at length today. Asymptomatic. No intervention needed.  Overview:  Last Assessment & Plan:  Atrophic and nonfunctional LEFT kidney.  Discussed at length today. Asymptomatic. No intervention needed. Last Assessment & Plan:  Atrophic and nonfunctional LEFT kidney.  Discussed at length today. Asymptomatic. No intervention needed.  . Cancer ([place])   . COPD (chronic obstructive pulmonary disease) ([place]) 08/28/2016  . Diabetes mellitus type 2, uncomplicated ([place]) 1/66/0630   diet controlled  . ED (erectile dysfunction) 08/28/2016  . GERD (gastroesophageal reflux disease)   . Gross hematuria 07/22/2016   Overview:  Last Assessment & Plan:  The differential diagnosis for hematuria was reviewed.  Possible etiologies include, but are not limited to urolithiasis, infection, urothelial or renal malignancies, medical renal disease, and benign idiopathic hematuria.  The workup was reviewed and he will need a urinary cytology testing, a CT urogram, and Cystourethroscopy.  He has moved to [place], [place] and would like to have all this done with a local urologist since it is an hour and a half drive. Will send records there and have the work performed locally.   > 15 minutes were spent in face to face contact with the patient, >50% of which was spent counseling about about the diagnosis and plan.  Last  Assessment & Plan:  The differential diagnosis for hematuria was reviewed.  Possible etiologies include, but are not limited to urolithiasis, infection, urothelial or renal malignancies, medical renal disease, and benign idiopathic hematuria.  The workup was reviewed and he will need a urinary cytology testing,   . H/O vagotomy 10/11/2012  . Hyperlipemia   . Hypertension 08/28/2016   (Pt denies,  Lisinopril for kidney protection)  . Kidney stones 08/28/2016   Last Assessment & Plan:  Can't see stone on KUB today again  Pt instructed to call or go to ER for acute flank pain since he only has one functioning kidney.  Currently no evidence of any obstructive uropathy.  Check chem 8 and call with results.  F/u one year for KUB  . Knee contusion 10/20/2013  . Peptic ulcer 08/28/2016  . Peripheral neuropathy   . Prostate cancer ([place])   . Sarcoidosis 04/04/2015  . Status post partial gastrectomy 10/11/2012  . Thyroid nodule   . Wears dentures    partial upper and lower  . Wears hearing aid in both ears    Past Surgical History:  Procedure Laterality Date  . APPENDECTOMY    . CHOLECYSTECTOMY    . COLONOSCOPY WITH PROPOFOL N/A 10/30/2019   Procedure: COLONOSCOPY WITH BIOPSY;  Surgeon: Lucilla Lame, MD;  Location: [facility];  Service: Endoscopy;  Laterality: N/A;  Diabetic - diet controlled priority 4  . EYE SURGERY     bialteral cataract extraction  . GASTRECTOMY    . INSERTION PROSTATE RADIATION SEED  2002   Brachytherapy  . nasal tumor    . POLYPECTOMY N/A 10/30/2019   Procedure: POLYPECTOMY;  Surgeon: Lucilla Lame, MD;  Location: [facility];  Service: Endoscopy;  Laterality: N/A;   Family History  Problem Relation Age of Onset  . Cancer Mother        lung  . Diabetes Father   . Congestive Heart Failure Father   . Cancer Brother   . Prostate cancer Neg Hx   . Kidney cancer Neg Hx    Social History   Socioeconomic History  . Marital status: Divorced    Spouse name: Not on file  . Number of children: 2  . Years of education: Not on file  . Highest education level: Bachelor's degree (e.g., BA, AB, BS)  Occupational History    Employer: [employer]  Tobacco Use  . Smoking status: Former Smoker    Packs/day: 2.00    Years: 35.00    Pack years: 70.00    Types: Cigarettes    Quit date: 1998    Years since quitting: 24.4  . Smokeless tobacco: Never  Used  . Tobacco comment: smoking cessation materials not required  Vaping Use  . Vaping Use: Never used  Substance and Sexual Activity  . Alcohol use: Yes    Alcohol/week: 0.0 standard drinks    Comment: Occasional beer  . Drug use: No  . Sexual activity: Not Currently    Birth control/protection: None  Other Topics Concern  . Not on file  Social History Narrative   Pt's sister lives with him   Social Determinants of Health   Financial Resource Strain: Low Risk   . Difficulty of Paying Living Expenses: Not hard at all  Food Insecurity: No Food Insecurity  . Worried About Charity fundraiser in the Last Year: Never true  . Ran Out of Food in the Last Year: Never true  Transportation Needs: No Transportation Needs  .  Lack of Transportation (Medical): No  . Lack of Transportation (Non-Medical): No  Physical Activity: Insufficiently Active  . Days of Exercise per Week: 3 days  . Minutes of Exercise per Session: 30 min  Stress: No Stress Concern Present  . Feeling of Stress : Not at all  Social Connections: Socially Isolated  . Frequency of Communication with Friends and Family: More than three times a week  . Frequency of Social Gatherings with Friends and Family: Twice a week  . Attends Religious Services: Never  . Active Member of Clubs or Organizations: No  . Attends Archivist Meetings: Never  . Marital Status: Divorced    Tobacco Counseling Counseling given: Not Answered Comment: smoking cessation materials not required   Clinical Intake:  Pre-visit preparation completed: Yes  Pain : 0-10 Pain Score: 3  Pain Type: Acute pain Pain Location: Back Pain Orientation: Lower Pain Descriptors / Indicators: Discomfort,Dull Pain Onset: In the past 7 days Pain Frequency: Intermittent     BMI - recorded: 34.61 Nutritional Status: BMI > 30  Obese Nutritional Risks: None Diabetes: No  How often do you need to have someone help you when you read  instructions, pamphlets, or other written materials from your doctor or pharmacy?: 1 - Never    Interpreter Needed?: No  Information entered by :: Clemetine Marker LPN   Activities of Daily Living In your present state of health, do you have any difficulty performing the following activities: 10/02/2020 10/30/2019  Hearing? Y N  Comment wears hearing aids -  Vision? N N  Difficulty concentrating or making decisions? N N  Walking or climbing stairs? N N  Dressing or bathing? N N  Doing errands, shopping? N -  Preparing Food and eating ? N -  Using the Toilet? N -  In the past six months, have you accidently leaked urine? N -  Do you have problems with loss of bowel control? N -  Managing your Medications? N -  Managing your Finances? N -  Housekeeping or managing your Housekeeping? N -  Some recent data might be hidden    Patient Care Team: Juline Patch, MD as PCP - General (Family Medicine) Elza Rafter, MD as Consulting Physician (Internal Medicine) Lonia Farber, MD as Consulting Physician (Internal Medicine)  Indicate any recent Medical Services you may have received from other than [facility] providers in the past year (date may be approximate).     Assessment:   This is a routine wellness examination for Juan [name].  Hearing/Vision screen  Hearing Screening   125Hz  250Hz  500Hz  1000Hz  2000Hz  3000Hz  4000Hz  6000Hz  8000Hz   Right ear:           Left ear:           Comments: Pt wears hearing aids   Vision Screening Comments: Annual vision screenings done at [facility]  Dietary issues and exercise activities discussed: Current Exercise Habits: Home exercise routine, Type of exercise: calisthenics;strength training/weights;stretching, Time (Minutes): 30, Frequency (Times/Week): 3, Weekly Exercise (Minutes/Week): 90, Intensity: Mild, Exercise limited by: orthopedic condition(s)  Goals Addressed            This Visit's Progress   . DIET - INCREASE WATER INTAKE    On track    Recommend to drink at least 6-8 8oz glasses of water per day.      Depression Screen PHQ 2/9 Scores 10/02/2020 09/18/2020 03/19/2020 02/02/2020 10/06/2019 09/27/2019 09/21/2018  PHQ - 2 Score 0 0 0 0 0 0  0  PHQ- 9 Score - 0 0 0 0 - -    Fall Risk Fall Risk  10/02/2020 03/19/2020 02/02/2020 10/06/2019 09/27/2019  Falls in the past year? 0 0 0 0 0  Number falls in past yr: 0 - - 0 0  Injury with Fall? 0 - - 0 0  Risk for fall due to : No Fall Risks - - No Fall Risks No Fall Risks  Risk for fall due to: Comment - - - - -  Follow up Falls prevention discussed Falls evaluation completed Falls evaluation completed Falls evaluation completed Falls prevention discussed    FALL RISK PREVENTION PERTAINING TO THE HOME:  Any stairs in or around the home? Yes  If so, are there any without handrails? No  Home free of loose throw rugs in walkways, pet beds, electrical cords, etc? Yes  Adequate lighting in your home to reduce risk of falls? Yes   ASSISTIVE DEVICES UTILIZED TO PREVENT FALLS:  Life alert? No  Use of a cane, walker or w/c? No  Grab bars in the bathroom? Yes  Shower chair or bench in shower? Yes  Elevated toilet seat or a handicapped toilet? No   TIMED UP AND GO:  Was the test performed? Yes .  Length of time to ambulate 10 feet: 5 sec.   Gait steady and fast without use of assistive device  Cognitive Function: Normal cognitive status assessed by direct observation by this Nurse Health Advisor. No abnormalities found.       6CIT Screen 09/21/2018 09/20/2017  What Year? 0 points 0 points  What month? 0 points 0 points  What time? 0 points 0 points  Count back from 20 0 points 0 points  Months in reverse 0 points 0 points  Repeat phrase 0 points 0 points  Total Score 0 0    Immunizations Immunization History  Administered Date(s) Administered  . Fluad Quad(high Dose 65+) 02/19/2020  . Influenza, High Dose Seasonal PF 01/23/2016, 03/01/2017, 03/29/2018, 03/22/2019   . Influenza-Unspecified 03/01/2017, 03/22/2019  . Moderna Sars-Covid-2 Vaccination 03/07/2020  . PFIZER(Purple Top)SARS-COV-2 Vaccination 08/09/2019, 08/30/2019  . Pneumococcal Conjugate-13 03/01/2017  . Pneumococcal Polysaccharide-23 03/21/2009, 04/01/2015  . Tdap 10/15/2013  . Tetanus 11/09/2006  . Zoster, Live 05/04/2008    TDAP status: Up to date  Flu Vaccine status: Up to date  Pneumococcal vaccine status: Up to date  Covid-19 vaccine status: Completed vaccines  Qualifies for Shingles Vaccine? Yes   Zostavax completed Yes   Shingrix Completed?: No.    Education has been provided regarding the importance of this vaccine. Patient has been advised to call insurance company to determine out of pocket expense if they have not yet received this vaccine. Advised may also receive vaccine at local pharmacy or Health Dept. Verbalized acceptance and understanding.  Screening Tests Health Maintenance  Topic Date Due  . Zoster Vaccines- Shingrix (1 of 2) Never done  . COVID-19 Vaccine (4 - Booster) 10/04/2020 (Originally 06/07/2020)  . INFLUENZA VACCINE  12/02/2020  . FOOT EXAM  03/19/2021  . HEMOGLOBIN A1C  03/21/2021  . OPHTHALMOLOGY EXAM  05/07/2021  . TETANUS/TDAP  10/16/2023  . COLONOSCOPY (Pts 45-29yrs Insurance coverage will need to be confirmed)  10/29/2024  . Hepatitis C Screening  Completed  . PNA vac Low Risk Adult  Completed  . HPV VACCINES  Aged Out    Health Maintenance  Health Maintenance Due  Topic Date Due  . Zoster Vaccines- Shingrix (1 of 2) Never done  Colorectal cancer screening: Type of screening: Colonoscopy. Completed 10/30/19. Repeat every 5 years  Lung Cancer Screening: (Low Dose CT Chest recommended if Age 10-80 years, 30 pack-year currently smoking OR have quit w/in 15years.) does not qualify.   Additional Screening:  Hepatitis C Screening: does qualify; Completed 06/30/17  Vision Screening: Recommended annual ophthalmology exams for early  detection of glaucoma and other disorders of the eye. Is the patient up to date with their annual eye exam?  Yes  Who is the provider or what is the name of the office in which the patient attends annual eye exams? [facility] Screening: Recommended annual dental exams for proper oral hygiene  Community Resource Referral / Chronic Care Management: CRR required this visit?  No   CCM required this visit?  No      Plan:     I have personally reviewed and noted the following in the patient's chart:   . Medical and social history . Use of alcohol, tobacco or illicit drugs  . Current medications and supplements including opioid prescriptions. Patient is not currently taking opioid prescriptions. . Functional ability and status . Nutritional status . Physical activity . Advanced directives . List of other physicians . Hospitalizations, surgeries, and ER visits in previous 12 months . Vitals . Screenings to include cognitive, depression, and falls . Referrals and appointments  In addition, I have reviewed and discussed with patient certain preventive protocols, quality metrics, and best practice recommendations. A written personalized care plan for preventive services as well as general preventive health recommendations were provided to patient.     Clemetine Marker, LPN   10/06/7901   Nurse Notes: none

## 2020-10-30 DIAGNOSIS — Z23 Encounter for immunization: Secondary | ICD-10-CM | POA: Diagnosis not present

## 2020-12-17 DIAGNOSIS — Z79899 Other long term (current) drug therapy: Secondary | ICD-10-CM | POA: Diagnosis not present

## 2020-12-17 DIAGNOSIS — E559 Vitamin D deficiency, unspecified: Secondary | ICD-10-CM | POA: Diagnosis not present

## 2020-12-17 DIAGNOSIS — C61 Malignant neoplasm of prostate: Secondary | ICD-10-CM | POA: Diagnosis not present

## 2020-12-17 DIAGNOSIS — Z87891 Personal history of nicotine dependence: Secondary | ICD-10-CM | POA: Diagnosis not present

## 2020-12-31 DIAGNOSIS — N200 Calculus of kidney: Secondary | ICD-10-CM | POA: Diagnosis not present

## 2020-12-31 DIAGNOSIS — N261 Atrophy of kidney (terminal): Secondary | ICD-10-CM | POA: Diagnosis not present

## 2020-12-31 DIAGNOSIS — Z87891 Personal history of nicotine dependence: Secondary | ICD-10-CM | POA: Diagnosis not present

## 2020-12-31 DIAGNOSIS — I2510 Atherosclerotic heart disease of native coronary artery without angina pectoris: Secondary | ICD-10-CM | POA: Diagnosis not present

## 2020-12-31 DIAGNOSIS — C61 Malignant neoplasm of prostate: Secondary | ICD-10-CM | POA: Diagnosis not present

## 2020-12-31 DIAGNOSIS — Z9884 Bariatric surgery status: Secondary | ICD-10-CM | POA: Diagnosis not present

## 2021-01-02 DIAGNOSIS — C61 Malignant neoplasm of prostate: Secondary | ICD-10-CM | POA: Diagnosis not present

## 2021-02-28 DIAGNOSIS — Z23 Encounter for immunization: Secondary | ICD-10-CM | POA: Diagnosis not present

## 2021-03-06 ENCOUNTER — Other Ambulatory Visit: Payer: Self-pay

## 2021-03-06 ENCOUNTER — Ambulatory Visit (INDEPENDENT_AMBULATORY_CARE_PROVIDER_SITE_OTHER): Payer: Medicare Other | Admitting: Family Medicine

## 2021-03-06 ENCOUNTER — Ambulatory Visit
Admission: RE | Admit: 2021-03-06 | Discharge: 2021-03-06 | Disposition: A | Discharge status: Home / Self Care | Payer: Medicare Other | Source: Ambulatory Visit | Attending: Family Medicine | Admitting: Family Medicine

## 2021-03-06 ENCOUNTER — Ambulatory Visit
Admission: RE | Admit: 2021-03-06 | Discharge: 2021-03-06 | Disposition: A | Discharge status: Home / Self Care | Payer: Medicare Other | Attending: Family Medicine | Admitting: Family Medicine

## 2021-03-06 ENCOUNTER — Encounter: Payer: Self-pay | Admitting: Family Medicine

## 2021-03-06 VITALS — BP 130/78 | HR 62 | Ht 68.0 in | Wt 216.0 lb

## 2021-03-06 DIAGNOSIS — R053 Chronic cough: Secondary | ICD-10-CM

## 2021-03-06 DIAGNOSIS — I10 Essential (primary) hypertension: Secondary | ICD-10-CM

## 2021-03-06 DIAGNOSIS — K219 Gastro-esophageal reflux disease without esophagitis: Secondary | ICD-10-CM

## 2021-03-06 MED ORDER — PANTOPRAZOLE SODIUM 40 MG PO TBEC: 40.0000 mg | DELAYED_RELEASE_TABLET | Freq: Two times a day (BID) | ORAL | 1 refills | Status: DC

## 2021-03-06 MED ORDER — LISINOPRIL 5 MG PO TABS: 5.0000 mg | ORAL_TABLET | Freq: Every day | ORAL | 1 refills | Status: DC

## 2021-03-06 NOTE — Progress Notes (Signed)
Date:  03/06/2021   Name:  Juan [name]   DOB:  Jun 03, 1950   MRN:  [id number]   Chief Complaint: Hypertension and Gastroesophageal Reflux  Hypertension This is a chronic problem. The current episode started more than 1 year ago. The problem has been gradually improving since onset. The problem is controlled. Pertinent negatives include no anxiety, blurred vision, chest pain, headaches, malaise/fatigue, neck pain, orthopnea, palpitations, peripheral edema, PND, shortness of breath or sweats. There are no associated agents to hypertension. Past treatments include ACE inhibitors. The current treatment provides moderate improvement. There are no compliance problems.  There is no history of angina, kidney disease, CAD/MI, CVA, heart failure, left ventricular hypertrophy, PVD or retinopathy. There is no history of chronic renal disease, a hypertension causing med or renovascular disease.  Gastroesophageal Reflux He complains of coughing. He reports no abdominal pain, no belching, no chest pain, no choking, no dysphagia, no globus sensation, no heartburn, no hoarse voice, no nausea, no sore throat, no tooth decay or no wheezing. The current episode started more than 1 year ago. The problem has been gradually worsening. The symptoms are aggravated by certain foods. Pertinent negatives include no anemia, fatigue, melena, muscle weakness, orthopnea or weight loss. He has tried a PPI for the symptoms. The treatment provided moderate relief.   Lab Results  Component Value Date   CREATININE 1.18 10/06/2019   BUN 19 10/06/2019   NA 141 10/06/2019   K 4.5 10/06/2019   CL 105 10/06/2019   CO2 21 10/06/2019   Lab Results  Component Value Date   CHOL 156 09/18/2020   HDL 55 09/18/2020   LDLCALC 81 09/18/2020   TRIG 110 09/18/2020   CHOLHDL 2.7 10/06/2018   Lab Results  Component Value Date   TSH 1.467 03/01/2017   Lab Results  Component Value Date   HGBA1C 5.6 09/18/2020   Lab Results   Component Value Date   WBC 8.1 05/11/2018   HGB 13.3 05/11/2018   HCT 38.3 05/11/2018   MCV 91 05/11/2018   PLT 444 05/11/2018   Lab Results  Component Value Date   ALT 17 04/28/2018   AST 25 04/28/2018   ALKPHOS 78 04/28/2018   BILITOT 1.2 04/28/2018     Review of Systems  Constitutional:  Negative for chills, fatigue, fever, malaise/fatigue and weight loss.  HENT:  Negative for drooling, ear discharge, ear pain, hoarse voice and sore throat.   Eyes:  Negative for blurred vision.  Respiratory:  Positive for cough. Negative for choking, shortness of breath and wheezing.   Cardiovascular:  Negative for chest pain, palpitations, orthopnea, leg swelling and PND.  Gastrointestinal:  Negative for abdominal pain, blood in stool, constipation, diarrhea, dysphagia, heartburn, melena and nausea.  Endocrine: Negative for polydipsia.  Genitourinary:  Negative for dysuria, frequency, hematuria and urgency.  Musculoskeletal:  Negative for back pain, myalgias, muscle weakness and neck pain.  Skin:  Negative for rash.  Allergic/Immunologic: Negative for environmental allergies.  Neurological:  Negative for dizziness and headaches.  Hematological:  Does not bruise/bleed easily.  Psychiatric/Behavioral:  Negative for suicidal ideas. The patient is not nervous/anxious.    Patient Active Problem List   Diagnosis Date Noted   Aortic atherosclerosis ([place]) 09/18/2020   Encounter for screening colonoscopy    Polyp of transverse colon    Multinodular goiter 08/29/2018   History of sarcoidosis 06/29/2018   Thyroid nodule 05/24/2018   Acetonemia due to secondary diabetes ([place]) 09/29/2017   Gastric hypersecretion 09/29/2017  Microalbuminuria 03/31/2017   B12 deficiency 03/01/2017   Prostate cancer ([place]) 08/28/2016   Obesity (BMI 30-39.9) 08/28/2016   Kidney stones, calcium oxalate 08/28/2016   ED (erectile dysfunction) 08/28/2016   History of duodenal ulcer 08/28/2016   Type 2 diabetes,  controlled, with neuropathy ([place]) 08/28/2016   History of cholelithiasis 08/28/2016   Arthritis 08/28/2016   Gastroesophageal reflux disease 08/28/2016   Hyperlipidemia, unspecified 04/04/2015   Hyperlipidemia 04/04/2015   Vitamin D deficiency, unspecified 07/12/2013   Vitamin D deficiency 07/12/2013   Atrophic kidney 01/13/2013   Status post partial gastrectomy 10/11/2012   H/O vagotomy 10/11/2012    Allergies  Allergen Reactions   Nsaids Other (See Comments)    Hx of PUD   Statins Rash    Hyperglycemia    Past Surgical History:  Procedure Laterality Date   APPENDECTOMY     CHOLECYSTECTOMY     COLONOSCOPY WITH PROPOFOL N/A 10/30/2019   Procedure: COLONOSCOPY WITH BIOPSY;  Surgeon: Lucilla Lame, MD;  Location: [facility];  Service: Endoscopy;  Laterality: N/A;  Diabetic - diet controlled priority 4   EYE SURGERY     bialteral cataract extraction   GASTRECTOMY     INSERTION PROSTATE RADIATION SEED  2002   Brachytherapy   nasal tumor     POLYPECTOMY N/A 10/30/2019   Procedure: POLYPECTOMY;  Surgeon: Lucilla Lame, MD;  Location: [facility];  Service: Endoscopy;  Laterality: N/A;    Social History   Tobacco Use   Smoking status: Former    Packs/day: 2.00    Years: 35.00    Pack years: 70.00    Types: Cigarettes    Quit date: 1998    Years since quitting: 24.8   Smokeless tobacco: Never   Tobacco comments:    smoking cessation materials not required  Vaping Use   Vaping Use: Never used  Substance Use Topics   Alcohol use: Yes    Alcohol/week: 0.0 standard drinks    Comment: Occasional beer   Drug use: No     Medication list has been reviewed and updated.  Current Meds  Medication Sig   acetaminophen (TYLENOL) 500 MG tablet Take 1,000 mg by mouth 2 (two) times daily as needed for pain.   Cholecalciferol (GNP VITAMIN D MAXIMUM STRENGTH) 2000 units TABS Take 1 tablet (2,000 Units total) by mouth daily.   Cyanocobalamin (B-12) 500 MCG TABS  Take 1 tablet by mouth daily.   darolutamide (NUBEQA) 300 MG tablet Take 600 mg by mouth 2 (two) times daily. berry   dutasteride (AVODART) 0.5 MG capsule Take 0.5 mg by mouth daily. Dr Gwenlyn Found   leuprolide (LUPRON) 22.5 MG injection Inject into the muscle. Dr Gwenlyn Found- cancer center   lisinopril (ZESTRIL) 5 MG tablet Take 1 tablet (5 mg total) by mouth daily.   Multiple Vitamin (MULTI-VITAMINS) TABS Take by mouth.   pantoprazole (PROTONIX) 40 MG tablet Take 1 tablet (40 mg total) by mouth 2 (two) times daily.    PHQ 2/9 Scores 03/06/2021 10/02/2020 09/18/2020 03/19/2020  PHQ - 2 Score 0 0 0 0  PHQ- 9 Score 0 - 0 0    GAD 7 : Generalized Anxiety Score 03/06/2021 09/18/2020 03/19/2020 02/02/2020  Nervous, Anxious, on Edge 0 0 0 0  Control/stop worrying 0 0 0 0  Worry too much - different things 0 0 0 0  Trouble relaxing 0 0 0 0  Restless 0 0 0 0  Easily annoyed or irritable 0 0 0 0  Afraid -  awful might happen 0 0 0 0  Total GAD 7 Score 0 0 0 0  Anxiety Difficulty - - - -    BP Readings from Last 3 Encounters:  03/06/21 130/78  10/02/20 138/82  09/18/20 130/82    Physical Exam Vitals and nursing note reviewed.  HENT:     Head: Normocephalic.     Right Ear: Tympanic membrane, ear canal and external ear normal.     Left Ear: Tympanic membrane, ear canal and external ear normal.     Nose: Nose normal. No congestion or rhinorrhea.     Mouth/Throat:     Mouth: Mucous membranes are moist.  Eyes:     General: No scleral icterus.       Right eye: No discharge.        Left eye: No discharge.     Conjunctiva/sclera: Conjunctivae normal.     Pupils: Pupils are equal, round, and reactive to light.  Neck:     Thyroid: No thyromegaly.     Vascular: No JVD.     Trachea: No tracheal deviation.  Cardiovascular:     Rate and Rhythm: Normal rate and regular rhythm.     Heart sounds: Normal heart sounds. No murmur heard.   No friction rub. No gallop.  Pulmonary:     Effort: No respiratory  distress.     Breath sounds: Normal breath sounds. No wheezing, rhonchi or rales.  Abdominal:     General: Bowel sounds are normal.     Palpations: Abdomen is soft. There is no mass.     Tenderness: There is no abdominal tenderness. There is no guarding or rebound.  Musculoskeletal:        General: No tenderness. Normal range of motion.     Cervical back: Normal range of motion and neck supple.  Lymphadenopathy:     Cervical: No cervical adenopathy.  Skin:    General: Skin is warm.     Findings: No rash.  Neurological:     Mental Status: He is alert and oriented to person, place, and time.     Cranial Nerves: No cranial nerve deficit.     Deep Tendon Reflexes: Reflexes are normal and symmetric.    Wt Readings from Last 3 Encounters:  03/06/21 216 lb (98 kg)  10/02/20 227 lb 9.6 oz (103.2 kg)  09/18/20 223 lb (101.2 kg)    BP 130/78   Pulse 62   Ht 5\' 8"  (1.727 m)   Wt 216 lb (98 kg)   BMI 32.84 kg/m   Assessment and Plan:  1. Essential hypertension Chronic.  Controlled.  Stable.  Uncomplicated.  Blood pressure today is 130/78.  We will continue lisinopril 5 mg once a day. - lisinopril (ZESTRIL) 5 MG tablet; Take 1 tablet (5 mg total) by mouth daily.  Dispense: 90 tablet; Refill: 1  2. Gastroesophageal reflux disease, unspecified whether esophagitis present Chronic.  Controlled.  Stable.  Patient is tolerating medication there is good results with no dysphagia.  We will continue pantoprazole 40 mg once a day. - pantoprazole (PROTONIX) 40 MG tablet; Take 1 tablet (40 mg total) by mouth 2 (two) times daily.  Dispense: 180 tablet; Refill: 1  3. Chronic cough Chronic.  Duration years.  Stable.  Uncomplicated.  Not associated with shortness of breath.  Review of chest x-ray notes that there is an abnormality with some reticular opacities in the right lung base.  We will repeat chest x-ray as that has been a year  for follow-up for stability. - DG Chest 2 View; Future

## 2021-03-19 DIAGNOSIS — R918 Other nonspecific abnormal finding of lung field: Secondary | ICD-10-CM | POA: Diagnosis not present

## 2021-03-19 DIAGNOSIS — E559 Vitamin D deficiency, unspecified: Secondary | ICD-10-CM | POA: Diagnosis not present

## 2021-03-19 DIAGNOSIS — Z9884 Bariatric surgery status: Secondary | ICD-10-CM | POA: Diagnosis not present

## 2021-03-19 DIAGNOSIS — C61 Malignant neoplasm of prostate: Secondary | ICD-10-CM | POA: Diagnosis not present

## 2021-03-19 DIAGNOSIS — Z9049 Acquired absence of other specified parts of digestive tract: Secondary | ICD-10-CM | POA: Diagnosis not present

## 2021-03-19 DIAGNOSIS — R590 Localized enlarged lymph nodes: Secondary | ICD-10-CM | POA: Diagnosis not present

## 2021-03-19 DIAGNOSIS — Z79818 Long term (current) use of other agents affecting estrogen receptors and estrogen levels: Secondary | ICD-10-CM | POA: Diagnosis not present

## 2021-03-19 DIAGNOSIS — Z87891 Personal history of nicotine dependence: Secondary | ICD-10-CM | POA: Diagnosis not present

## 2021-05-09 DIAGNOSIS — E113293 Type 2 diabetes mellitus with mild nonproliferative diabetic retinopathy without macular edema, bilateral: Secondary | ICD-10-CM | POA: Diagnosis not present

## 2021-05-09 LAB — HM DIABETES EYE EXAM

## 2021-05-14 DIAGNOSIS — E042 Nontoxic multinodular goiter: Secondary | ICD-10-CM | POA: Diagnosis not present

## 2021-05-30 DIAGNOSIS — E042 Nontoxic multinodular goiter: Secondary | ICD-10-CM | POA: Diagnosis not present

## 2021-06-23 DIAGNOSIS — E559 Vitamin D deficiency, unspecified: Secondary | ICD-10-CM | POA: Diagnosis not present

## 2021-06-23 DIAGNOSIS — R5383 Other fatigue: Secondary | ICD-10-CM | POA: Diagnosis not present

## 2021-06-23 DIAGNOSIS — Z9049 Acquired absence of other specified parts of digestive tract: Secondary | ICD-10-CM | POA: Diagnosis not present

## 2021-06-23 DIAGNOSIS — C61 Malignant neoplasm of prostate: Secondary | ICD-10-CM | POA: Diagnosis not present

## 2021-06-23 DIAGNOSIS — Z79899 Other long term (current) drug therapy: Secondary | ICD-10-CM | POA: Diagnosis not present

## 2021-06-23 DIAGNOSIS — Z923 Personal history of irradiation: Secondary | ICD-10-CM | POA: Diagnosis not present

## 2021-06-23 DIAGNOSIS — Z87891 Personal history of nicotine dependence: Secondary | ICD-10-CM | POA: Diagnosis not present

## 2021-06-23 DIAGNOSIS — Z9884 Bariatric surgery status: Secondary | ICD-10-CM | POA: Diagnosis not present

## 2021-06-23 DIAGNOSIS — Z5111 Encounter for antineoplastic chemotherapy: Secondary | ICD-10-CM | POA: Diagnosis not present

## 2021-09-03 ENCOUNTER — Ambulatory Visit (INDEPENDENT_AMBULATORY_CARE_PROVIDER_SITE_OTHER): Payer: Medicare Other | Admitting: Family Medicine

## 2021-09-03 ENCOUNTER — Encounter: Payer: Self-pay | Admitting: Family Medicine

## 2021-09-03 VITALS — BP 120/70 | HR 66 | Ht 68.0 in | Wt 208.0 lb

## 2021-09-03 DIAGNOSIS — Z789 Other specified health status: Secondary | ICD-10-CM

## 2021-09-03 DIAGNOSIS — M79642 Pain in left hand: Secondary | ICD-10-CM

## 2021-09-03 DIAGNOSIS — I10 Essential (primary) hypertension: Secondary | ICD-10-CM

## 2021-09-03 DIAGNOSIS — M79641 Pain in right hand: Secondary | ICD-10-CM | POA: Diagnosis not present

## 2021-09-03 DIAGNOSIS — K219 Gastro-esophageal reflux disease without esophagitis: Secondary | ICD-10-CM | POA: Diagnosis not present

## 2021-09-03 DIAGNOSIS — H9193 Unspecified hearing loss, bilateral: Secondary | ICD-10-CM | POA: Diagnosis not present

## 2021-09-03 DIAGNOSIS — E1140 Type 2 diabetes mellitus with diabetic neuropathy, unspecified: Secondary | ICD-10-CM

## 2021-09-03 DIAGNOSIS — E785 Hyperlipidemia, unspecified: Secondary | ICD-10-CM | POA: Diagnosis not present

## 2021-09-03 DIAGNOSIS — E669 Obesity, unspecified: Secondary | ICD-10-CM

## 2021-09-03 DIAGNOSIS — I700 Atherosclerosis of aorta: Secondary | ICD-10-CM | POA: Diagnosis not present

## 2021-09-03 MED ORDER — LISINOPRIL 5 MG PO TABS: 5.0000 mg | ORAL_TABLET | Freq: Every day | ORAL | 1 refills | Status: DC

## 2021-09-03 MED ORDER — PANTOPRAZOLE SODIUM 40 MG PO TBEC: 40.0000 mg | DELAYED_RELEASE_TABLET | Freq: Two times a day (BID) | ORAL | 1 refills | Status: DC

## 2021-09-03 NOTE — Progress Notes (Signed)
? ? ?Date:  09/03/2021  ? ?Name:  Juan [name]   DOB:  [birth year]   MRN:  [id number] ? ? ?Chief Complaint: Hypertension, Gastroesophageal Reflux, Prediabetes (Foot exam), and Hand Pain (Tylenol helps, but thumb crosses over. ) ? ?Hypertension ?This is a chronic problem. The current episode started more than 1 year ago. The problem has been gradually improving since onset. The problem is controlled. Pertinent negatives include no chest pain, headaches, neck pain, orthopnea, palpitations or shortness of breath. Risk factors for coronary artery disease include dyslipidemia. Past treatments include ACE inhibitors. The current treatment provides moderate improvement. There are no compliance problems.  There is no history of angina, kidney disease, CAD/MI, CVA, heart failure, left ventricular hypertrophy, PVD or retinopathy. There is no history of chronic renal disease, a hypertension causing med or renovascular disease.  ?Gastroesophageal Reflux ?He reports no abdominal pain, no chest pain, no coughing, no heartburn, no nausea, no sore throat or no wheezing. This is a chronic problem. The current episode started more than 1 year ago. The problem has been gradually improving.  ?Hand Pain  ?There was no injury mechanism. The pain is present in the left hand and right hand. The quality of the pain is described as aching. The pain is at a severity of 3/10. The pain is mild. The pain has been Fluctuating since the incident. Pertinent negatives include no chest pain, numbness or tingling. Nothing aggravates the symptoms.  ? ?Lab Results  ?Component Value Date  ? NA 141 10/06/2019  ? K 4.5 10/06/2019  ? CO2 21 10/06/2019  ? GLUCOSE 90 10/06/2019  ? BUN 19 10/06/2019  ? CREATININE 1.18 10/06/2019  ? CALCIUM 9.5 10/06/2019  ? GFRNONAA 62 10/06/2019  ? ?Lab Results  ?Component Value Date  ? CHOL 156 09/18/2020  ? HDL 55 09/18/2020  ? Richards 81 09/18/2020  ? TRIG 110 09/18/2020  ? CHOLHDL 2.7 10/06/2018  ? ?Lab Results   ?Component Value Date  ? TSH 1.467 03/01/2017  ? ?Lab Results  ?Component Value Date  ? HGBA1C 5.6 09/18/2020  ? ?Lab Results  ?Component Value Date  ? WBC 8.1 05/11/2018  ? HGB 13.3 05/11/2018  ? HCT 38.3 05/11/2018  ? MCV 91 05/11/2018  ? PLT 444 05/11/2018  ? ?Lab Results  ?Component Value Date  ? ALT 17 04/28/2018  ? AST 25 04/28/2018  ? ALKPHOS 78 04/28/2018  ? BILITOT 1.2 04/28/2018  ? ?Lab Results  ?Component Value Date  ? VD25OH 51.5 06/30/2017  ?  ? ?Review of Systems  ?Constitutional:  Negative for chills and fever.  ?HENT:  Negative for drooling, ear discharge, ear pain and sore throat.   ?Respiratory:  Negative for cough, shortness of breath and wheezing.   ?Cardiovascular:  Negative for chest pain, palpitations, orthopnea and leg swelling.  ?Gastrointestinal:  Negative for abdominal pain, blood in stool, constipation, diarrhea, heartburn and nausea.  ?Endocrine: Negative for polydipsia.  ?Genitourinary:  Negative for dysuria, frequency, hematuria and urgency.  ?Musculoskeletal:  Negative for back pain, myalgias and neck pain.  ?Skin:  Negative for rash.  ?Allergic/Immunologic: Negative for environmental allergies.  ?Neurological:  Negative for dizziness, tingling, numbness and headaches.  ?Hematological:  Does not bruise/bleed easily.  ?Psychiatric/Behavioral:  Negative for suicidal ideas. The patient is not nervous/anxious.   ? ?Patient Active Problem List  ? Diagnosis Date Noted  ? Aortic atherosclerosis ([place]) 09/18/2020  ? Encounter for screening colonoscopy   ? Polyp of transverse colon   ? Multinodular goiter  08/29/2018  ? History of sarcoidosis 06/29/2018  ? Thyroid nodule 05/24/2018  ? Acetonemia due to secondary diabetes ([place]) 09/29/2017  ? Gastric hypersecretion 09/29/2017  ? Microalbuminuria 03/31/2017  ? B12 deficiency 03/01/2017  ? Prostate cancer ([place]) 08/28/2016  ? Obesity (BMI 30-39.9) 08/28/2016  ? Kidney stones, calcium oxalate 08/28/2016  ? ED (erectile dysfunction) 08/28/2016  ?  History of duodenal ulcer 08/28/2016  ? Type 2 diabetes, controlled, with neuropathy ([place]) 08/28/2016  ? History of cholelithiasis 08/28/2016  ? Arthritis 08/28/2016  ? Gastroesophageal reflux disease 08/28/2016  ? Hyperlipidemia, unspecified 04/04/2015  ? Hyperlipidemia 04/04/2015  ? Vitamin D deficiency, unspecified 07/12/2013  ? Vitamin D deficiency 07/12/2013  ? Atrophic kidney 01/13/2013  ? Status post partial gastrectomy 10/11/2012  ? H/O vagotomy 10/11/2012  ? ? ?Allergies  ?Allergen Reactions  ? Nsaids Other (See Comments)  ?  Hx of PUD  ? Statins Rash  ?  Hyperglycemia  ? ? ?Past Surgical History:  ?Procedure Laterality Date  ? APPENDECTOMY    ? CHOLECYSTECTOMY    ? COLONOSCOPY WITH PROPOFOL N/A 10/30/2019  ? Procedure: COLONOSCOPY WITH BIOPSY;  Surgeon: Lucilla Lame, MD;  Location: [facility];  Service: Endoscopy;  Laterality: N/A;  Diabetic - diet controlled ?priority 4  ? EYE SURGERY    ? bialteral cataract extraction  ? GASTRECTOMY    ? INSERTION PROSTATE RADIATION SEED  2002  ? Brachytherapy  ? nasal tumor    ? POLYPECTOMY N/A 10/30/2019  ? Procedure: POLYPECTOMY;  Surgeon: Lucilla Lame, MD;  Location: [facility];  Service: Endoscopy;  Laterality: N/A;  ? ? ?Social History  ? ?Tobacco Use  ? Smoking status: Former  ?  Packs/day: 2.00  ?  Years: 35.00  ?  Pack years: 70.00  ?  Types: Cigarettes  ?  Quit date: 1998  ?  Years since quitting: 25.3  ? Smokeless tobacco: Never  ? Tobacco comments:  ?  smoking cessation materials not required  ?Vaping Use  ? Vaping Use: Never used  ?Substance Use Topics  ? Alcohol use: Yes  ?  Alcohol/week: 0.0 standard drinks  ?  Comment: Occasional beer  ? Drug use: No  ? ? ? ?Medication list has been reviewed and updated. ? ?Current Meds  ?Medication Sig  ? acetaminophen (TYLENOL) 500 MG tablet Take 1,000 mg by mouth 2 (two) times daily as needed for pain.  ? Cholecalciferol (GNP VITAMIN D MAXIMUM STRENGTH) 2000 units TABS Take 1 tablet (2,000 Units total)  by mouth daily.  ? Cyanocobalamin (B-12) 500 MCG TABS Take 1 tablet by mouth daily.  ? darolutamide (NUBEQA) 300 MG tablet Take 600 mg by mouth 2 (two) times daily. berry  ? dutasteride (AVODART) 0.5 MG capsule Take 0.5 mg by mouth daily. Dr Gwenlyn Found  ? leuprolide (LUPRON) 22.5 MG injection Inject into the muscle. Dr Gwenlyn Found- cancer center  ? lisinopril (ZESTRIL) 5 MG tablet Take 1 tablet (5 mg total) by mouth daily.  ? Multiple Vitamin (MULTI-VITAMINS) TABS Take by mouth.  ? pantoprazole (PROTONIX) 40 MG tablet Take 1 tablet (40 mg total) by mouth 2 (two) times daily.  ? ? ? ?  09/03/2021  ?  9:24 AM 03/06/2021  ?  8:58 AM 09/18/2020  ?  9:02 AM 03/19/2020  ?  9:02 AM  ?GAD 7 : Generalized Anxiety Score  ?Nervous, Anxious, on Edge 0 0 0 0  ?Control/stop worrying 0 0 0 0  ?Worry too much - different things 0 0 0  0  ?Trouble relaxing 0 0 0 0  ?Restless 0 0 0 0  ?Easily annoyed or irritable 0 0 0 0  ?Afraid - awful might happen 0 0 0 0  ?Total GAD 7 Score 0 0 0 0  ?Anxiety Difficulty Not difficult at all     ? ? ? ?  09/03/2021  ?  9:24 AM  ?Depression screen PHQ 2/9  ?Decreased Interest 0  ?Down, Depressed, Hopeless 0  ?PHQ - 2 Score 0  ?Altered sleeping 1  ?Tired, decreased energy 1  ?Change in appetite 0  ?Feeling bad or failure about yourself  0  ?Trouble concentrating 0  ?Moving slowly or fidgety/restless 0  ?Suicidal thoughts 0  ?PHQ-9 Score 2  ?Difficult doing work/chores Not difficult at all  ? ? ?BP Readings from Last 3 Encounters:  ?09/03/21 120/70  ?03/06/21 130/78  ?10/02/20 138/82  ? ? ?Physical Exam ?Vitals and nursing note reviewed.  ?HENT:  ?   Head: Normocephalic.  ?   Right Ear: External ear normal.  ?   Left Ear: External ear normal.  ?   Nose: Nose normal.  ?   Mouth/Throat:  ?   Mouth: Mucous membranes are moist.  ?   Pharynx: No oropharyngeal exudate or posterior oropharyngeal erythema.  ?Eyes:  ?   General: No scleral icterus.    ?   Right eye: No discharge.     ?   Left eye: No discharge.  ?    Conjunctiva/sclera: Conjunctivae normal.  ?   Pupils: Pupils are equal, round, and reactive to light.  ?Neck:  ?   Thyroid: No thyromegaly.  ?   Vascular: No JVD.  ?   Trachea: No tracheal deviation.  ?Cardiovascular:  ?   Ra

## 2021-09-03 NOTE — Patient Instructions (Signed)
Atherosclerosis ? ?Atherosclerosis is when plaque builds up in the arteries. This causes narrowing and hardening of the arteries. Arteries are blood vessels that carry blood from the heart to all parts of the body. This blood contains oxygen. Plaque occurs due to inflammation or from a buildup of fat, cholesterol, calcium, waste products of cells, and a clotting material in the blood (fibrin). Plaque decreases the amount of blood that can flow through the artery. ?Atherosclerosis can affect any artery in your body, including: ?Heart arteries. Damage to these arteries may lead to coronary artery disease, which can cause a heart attack. ?Brain arteries. Damage to these arteries may cause a stroke. ?Leg, arm, and pelvis arteries. Peripheral artery disease (PAD) may result from damage to these arteries. ?Kidney arteries. Kidney (renal) failure may result from damage to kidney arteries. ?Treatment may slow the disease and prevent further damage to your heart, brain, peripheral arteries, and kidneys. ?What are the causes? ?This condition develops slowly over many years. The inner layers of your arteries become damaged and allow the gradual buildup of plaque. The exact cause of atherosclerosis is not fully understood. Symptoms of atherosclerosis do not occur until an artery becomes narrow or blocked. ?What increases the risk? ?The following factors may make you more likely to develop this condition: ?Being middle-aged or older. ?Certain medical conditions, including: ?High blood pressure. ?High cholesterol. ?High blood fats (triglycerides). ?Diabetes. ?Sleep apnea. ?Obesity. ?Certain lab levels, including: ?Elevated C-reactive protein (CRP). This is a sign of increased inflammation in your body. ?Elevated homocysteine levels. This is an amino acid that is associated with heart and blood vessel disease. ?Using tobacco or nicotine products. ?A family history of atherosclerosis. ?Not exercising enough (sedentary  lifestyle). ?Being stressed. ?Drinking too much alcohol or using drugs, such as cocaine or methamphetamine. ?What are the signs or symptoms? ?Symptoms of atherosclerosis do not occur until the plaque severely narrows or blocks the artery, which decreases blood flow. Sometimes, atherosclerosis does not cause symptoms. ?Symptoms of this condition include: ?Coronary artery disease. This may cause chest pain and shortness of breath. ?Decreased blood supply to your brain, which may cause a stroke. Signs of a stroke may include sudden: ?Weakness or numbness in your face, arm, or leg, especially on one side of your body. ?Trouble walking or difficulty moving your arms or legs. ?Loss of balance or coordination. ?Confusion. ?Slurred speech. ?Trouble speaking, or trouble understanding speech, or both (aphasia). ?Vision changes in one or both eyes. This may be double vision, blurred vision, or loss of vision. ?Severe headache with no known cause. The headache is often described as the worst headache ever experienced. ?PAD, which may cause pain, numbness, or nonhealing wounds, often in your legs and hips. ?Renal failure. This may cause tiredness, problems with urination, swelling, and itchy skin. ?How is this diagnosed? ?This condition is diagnosed based on your medical history and a physical exam. During the exam, your health care provider will: ?Check your pulse in different places. ?Listen for a "whooshing" sound over your arteries (bruit). ?You may also have tests, such as: ?Blood tests to check your levels of cholesterol, triglycerides, blood sugar, and CRP. ?Ankle-brachial index to compare blood pressure in your arms to blood pressure in your ankles to see how your blood is flowing. ?Heart (cardiac) tests. ?Electrocardiogram (ECG) to check for heart damage. ?Stress test to see how your heart reacts to exercise. ?Ultrasound tests. ?Ultrasound of your peripheral arteries to check blood flow. ?Echocardiogram to get images of  your heart's  chambers and valves. ?X-ray tests. ?Chest X-ray to see if you have an enlarged heart, which is a sign of heart failure. ?CT scan to check for damage to your heart, brain, or arteries. ?Angiogram. This is a test where dye is injected and X-rays are used to see the blood flow in the arteries. ?How is this treated? ?This condition is treated with lifestyle changes as the first step. These may include: ?Changing your diet. ?Losing weight. ?Reducing stress. ?Exercising and being physically active more regularly. ?Quitting smoking. ?You may also need medicine to: ?Lower triglycerides and cholesterol. ?Control blood pressure. ?Prevent blood clots. ?Lower inflammation in your body. ?Control your blood sugar. ?Sometimes, surgery is needed to: ?Remove plaque from an artery (endarterectomy). ?Open or widen a narrowed heart artery or peripheral artery (angioplasty). ?Create a new path for your blood with one of these procedures: ?Heart (coronary) artery bypass graft surgery. ?Peripheral artery bypass graft surgery. ?Place a small mesh tube (stent) in an artery to open or widen a narrowed artery. ?Follow these instructions at home: ?Eating and drinking ? ?Eat a heart-healthy diet. Talk with your health care provider or a dietitian if you need help. A heart-healthy diet involves: ?Limiting unhealthy fats and increasing healthy fats. Some examples of healthy fats are avocados and olive oil. ?Eating plant-based foods, such as fruits, vegetables, nuts, whole grains, and legumes (such as peas and lentils). ?If you drink alcohol: ?Limit how much you have to: ?0-1 drink a day for women who are not pregnant. ?0-2 drinks a day for men. ?Know how much alcohol is in a drink. In the [place] one drink equals one 12 oz bottle of beer (355 mL), one 5 oz glass of wine (148 mL), or one 1? oz glass of hard liquor (44 mL). ?Lifestyle ? ?Maintain a healthy weight. Lose weight if your health care provider says that you need to do  that. ?Follow an exercise program as told by your health care provider. ?Do not use any products that contain nicotine or tobacco. These products include cigarettes, chewing tobacco, and vaping devices, such as e-cigarettes. If you need help quitting, ask your health care provider. ?Do not use drugs. ?General instructions ?Take over-the-counter and prescription medicines only as told by your health care provider. ?Manage other health conditions as told. ?Keep all follow-up visits. This is important. ?Contact a health care provider if you have: ?An irregular heartbeat. ?Unexplained tiredness (fatigue). ?Trouble urinating, or you are producing less urine or foamy urine. ?Swelling of your hands or feet, or itchy skin. ?Unexplained pain or numbness in your legs or hips. ?A wound that is slow to heal or is not healing. ?Get help right away if: ?You have any symptoms of a heart attack. These may be: ?Chest pain. This includes squeezing chest pain that may feel like indigestion (angina). ?Shortness of breath. ?Pain in your neck, jaw, arms, back, or stomach. ?Cold sweat. ?Nausea. ?Light-headedness. ?Sudden pain, numbness, or coldness in a limb. ?You have any symptoms of a stroke. "BE FAST" is an easy way to remember the main warning signs of a stroke: ?B - Balance. Signs are dizziness, sudden trouble walking, or loss of balance. ?E - Eyes. Signs are trouble seeing or a sudden change in vision. ?F - Face. Signs are sudden weakness or numbness of the face, or the face or eyelid drooping on one side. ?A - Arms. Signs are weakness or numbness in an arm. This happens suddenly and usually on one side of the body. ?S -  Speech. Signs are sudden trouble speaking, slurred speech, or trouble understanding what people say. ?T - Time. Time to call emergency services. Write down what time symptoms started. ?You have other signs of a stroke, such as: ?A sudden, severe headache with no known cause. ?Nausea or vomiting. ?Seizure. ?These  symptoms may represent a serious problem that is an emergency. Do not wait to see if the symptoms will go away. Get medical help right away. Call your local emergency services (911 in the [place] Do not drive yourself to the

## 2021-09-04 LAB — LIPID PANEL WITH LDL/HDL RATIO
Cholesterol, Total: 159 mg/dL (ref 100–199)
HDL: 64 mg/dL (ref >39)
LDL Chol Calc (NIH): 75 mg/dL (ref 0–99)
LDL/HDL Ratio: 1.2 ratio (ref 0.0–3.6)
Triglycerides: 110 mg/dL (ref 0–149)
VLDL Cholesterol Cal: 20 mg/dL (ref 5–40)

## 2021-09-04 LAB — HEMOGLOBIN A1C
Est. average glucose Bld gHb Est-mCnc: 111 mg/dL
Hgb A1c MFr Bld: 5.5 % (ref 4.8–5.6)

## 2021-09-09 ENCOUNTER — Encounter: Payer: Self-pay | Admitting: Family Medicine

## 2021-09-09 ENCOUNTER — Ambulatory Visit
Admission: RE | Admit: 2021-09-09 | Discharge: 2021-09-09 | Disposition: A | Discharge status: Home / Self Care | Payer: Medicare Other | Attending: Family Medicine | Admitting: Family Medicine

## 2021-09-09 ENCOUNTER — Ambulatory Visit (INDEPENDENT_AMBULATORY_CARE_PROVIDER_SITE_OTHER): Payer: Medicare Other | Admitting: Family Medicine

## 2021-09-09 ENCOUNTER — Ambulatory Visit
Admission: RE | Admit: 2021-09-09 | Discharge: 2021-09-09 | Disposition: A | Discharge status: Home / Self Care | Payer: Medicare Other | Source: Ambulatory Visit | Attending: Family Medicine | Admitting: Family Medicine

## 2021-09-09 VITALS — BP 122/60 | HR 68 | Ht 68.0 in | Wt 210.0 lb

## 2021-09-09 DIAGNOSIS — M19041 Primary osteoarthritis, right hand: Secondary | ICD-10-CM

## 2021-09-09 DIAGNOSIS — M19042 Primary osteoarthritis, left hand: Secondary | ICD-10-CM | POA: Diagnosis not present

## 2021-09-09 DIAGNOSIS — M79642 Pain in left hand: Secondary | ICD-10-CM | POA: Diagnosis not present

## 2021-09-09 DIAGNOSIS — M79641 Pain in right hand: Secondary | ICD-10-CM | POA: Insufficient documentation

## 2021-09-09 MED ORDER — DICLOFENAC SODIUM 1 % EX GEL: 2.0000 g | Freq: Four times a day (QID) | CUTANEOUS | 0 refills | Status: DC | PRN

## 2021-09-09 NOTE — Patient Instructions (Signed)
-   Apply Voltaren gel (diclofenac 1%) every a.m. and every p.m. scheduled x2 weeks (additional complications can be used as needed) ?- Start home exercises after 1 week, continue x4 weeks total ?- Contact our office if symptoms persist without improvement at the 4 to 6-week mark ?

## 2021-09-09 NOTE — Assessment & Plan Note (Signed)
Patient with chronic bilateral hand pain, stiffness, difficulty with making a fist, worse stiffness in the morning, alleviates with gentle activity, further recurrence with prolonged/repetitive activities.  Ongoing for years, mild progression, no family history of autoimmune conditions. ? ?Examination reveals full range of motion with some discomfort during resisted wrist extension left greater than right localizing to the dorsal wrist, no palpable nodules or synovial thickening, nontender throughout the hands bilaterally, sensorimotor intact and symmetric. ? ?Given his x-ray findings revealing primary degenerative changes at the PIP joints of digits 2- 5, first [place] basal degenerative changes, and a stated symptomatology, his presentation is most consistent with osteoarthritis related pain of bilateral hands/fingers.  I have advised initial treatment with scheduled diclofenac 1% twice daily, home-based exercises, and for him to contact us for recalcitrant symptomatology at which point we can consider oral NSAIDs with GI prophylaxis. ? ?Chronic condition, symptomatic, Rx management, independent interpretation x-rays ?

## 2021-09-09 NOTE — Progress Notes (Signed)
?  ? ?  Primary Care / Sports Medicine Office Visit ? ?Patient Information:  ?Patient ID: Juan [name], male DOB: [birth year] Age: 71 y.o. MRN: [id number]  ? ?Juan [name] is a pleasant 71 y.o. male presenting with the following: ? ?Chief Complaint  ?Patient presents with  ? Hand Pain  ?  Bilateral, thinks it may be trigger finger, stiffing of fingers for years.  Only hurts when the lock up. It is all of his fingers  ? ? ?Vitals:  ? 09/09/21 0854  ?BP: 122/60  ?Pulse: 68  ? ?Vitals:  ? 09/09/21 0854  ?Weight: 210 lb (95.3 kg)  ?Height: '5\' 8"'$  (1.727 m)  ? ?Body mass index is 31.93 kg/m?. ? ?No results found.  ? ?Independent interpretation of notes and tests performed by another provider:  ? ?Independent interpretation of bilateral hand x-ray demonstrates degenerative changes at the PIP joints of digits 2- 5, first [place] basal degenerative changes ? ?Procedures performed:  ? ?None ? ?Pertinent History, Exam, Impression, and Recommendations:  ? ?Problem List Items Addressed This Visit   ? ?  ? Musculoskeletal and Integument  ? Osteoarthritis of fingers of hands, bilateral - Primary  ?  Patient with chronic bilateral hand pain, stiffness, difficulty with making a fist, worse stiffness in the morning, alleviates with gentle activity, further recurrence with prolonged/repetitive activities.  Ongoing for years, mild progression, no family history of autoimmune conditions. ? ?Examination reveals full range of motion with some discomfort during resisted wrist extension left greater than right localizing to the dorsal wrist, no palpable nodules or synovial thickening, nontender throughout the hands bilaterally, sensorimotor intact and symmetric. ? ?Given his x-ray findings revealing primary degenerative changes at the PIP joints of digits 2- 5, first [place] basal degenerative changes, and a stated symptomatology, his presentation is most consistent with osteoarthritis related pain of bilateral hands/fingers.  I have advised  initial treatment with scheduled diclofenac 1% twice daily, home-based exercises, and for him to contact us for recalcitrant symptomatology at which point we can consider oral NSAIDs with GI prophylaxis. ? ?Chronic condition, symptomatic, Rx management, independent interpretation x-rays ? ?  ?  ? Relevant Medications  ? diclofenac Sodium (VOLTAREN) 1 % GEL  ?  ? ?Orders & Medications ?Meds ordered this encounter  ?Medications  ? diclofenac Sodium (VOLTAREN) 1 % GEL  ?  Sig: Apply 2 g topically 4 (four) times daily as needed. To affected joint.  ?  Dispense:  100 g  ?  Refill:  0  ? ?No orders of the defined types were placed in this encounter. ?  ? ?No follow-ups on file.  ?  ? ?Montel Culver, MD ? ? Primary Care Sports Medicine ?[facility] ?[place]  ? ?

## 2021-09-10 NOTE — Telephone Encounter (Signed)
Please advise 

## 2021-09-18 ENCOUNTER — Encounter: Payer: Self-pay | Admitting: Family Medicine

## 2021-09-22 DIAGNOSIS — Z87891 Personal history of nicotine dependence: Secondary | ICD-10-CM | POA: Diagnosis not present

## 2021-09-22 DIAGNOSIS — E559 Vitamin D deficiency, unspecified: Secondary | ICD-10-CM | POA: Diagnosis not present

## 2021-09-22 DIAGNOSIS — C61 Malignant neoplasm of prostate: Secondary | ICD-10-CM | POA: Diagnosis not present

## 2021-09-22 DIAGNOSIS — Z79899 Other long term (current) drug therapy: Secondary | ICD-10-CM | POA: Diagnosis not present

## 2021-10-06 ENCOUNTER — Ambulatory Visit (INDEPENDENT_AMBULATORY_CARE_PROVIDER_SITE_OTHER): Payer: Medicare Other

## 2021-10-06 ENCOUNTER — Other Ambulatory Visit: Payer: Self-pay | Admitting: Family Medicine

## 2021-10-06 DIAGNOSIS — Z0000 Encounter for general adult medical examination without abnormal findings: Secondary | ICD-10-CM | POA: Diagnosis not present

## 2021-10-06 DIAGNOSIS — M19041 Primary osteoarthritis, right hand: Secondary | ICD-10-CM

## 2021-10-06 NOTE — Progress Notes (Signed)
Subjective:   Juan [name] is a 71 y.o. male who presents for Medicare Annual/Subsequent preventive examination.  Virtual Visit via Telephone Note  I connected with  Juan [name] on 10/06/21 at  1:30 PM EDT by telephone and verified that I am speaking with the correct person using two identifiers.  Location: Patient: home Provider: [facility] Persons participating in the virtual visit: Juan [name]   I discussed the limitations, risks, security and privacy concerns of performing an evaluation and management service by telephone and the availability of in person appointments. The patient expressed understanding and agreed to proceed.  Interactive audio and video telecommunications were attempted between this nurse and patient, however failed, due to patient having technical difficulties OR patient did not have access to video capability.  We continued and completed visit with audio only.  Some vital signs may be absent or patient reported.   Clemetine Marker, LPN   Review of Systems     Cardiac Risk Factors include: advanced age (>78mn, >>66women);male gender;hypertension     Objective:    There were no vitals filed for this visit. There is no height or weight on file to calculate BMI.     10/06/2021    1:39 PM 10/02/2020    1:38 PM 10/30/2019    7:14 AM 09/27/2019    1:43 PM 09/21/2018    1:43 PM 04/28/2018    3:41 PM 09/20/2017    1:53 PM  Advanced Directives  Does Patient Have a Medical Advance Directive? No No No No No No No  Would patient like information on creating a medical advance directive? No - Patient declined No - Patient declined No - Patient declined No - Patient declined Yes (MAU/Ambulatory/Procedural Areas - Information given)  Yes (MAU/Ambulatory/Procedural Areas - Information given)    Current Medications (verified) Outpatient Encounter Medications as of 10/06/2021  Medication Sig   acetaminophen (TYLENOL) 500 MG tablet Take 1,000 mg by mouth  2 (two) times daily as needed for pain.   Biotin 1000 MCG tablet Take by mouth.   Cholecalciferol (GNP VITAMIN D MAXIMUM STRENGTH) 2000 units TABS Take 1 tablet (2,000 Units total) by mouth daily.   Cyanocobalamin (B-12) 500 MCG TABS Take 1 tablet by mouth daily.   darolutamide (NUBEQA) 300 MG tablet Take 600 mg by mouth 2 (two) times daily. berry   diclofenac Sodium (VOLTAREN) 1 % GEL Apply 2 g topically 4 (four) times daily as needed. To affected joint.   dutasteride (AVODART) 0.5 MG capsule Take 0.5 mg by mouth daily. Dr BGwenlyn Found  leuprolide (LUPRON) 22.5 MG injection Inject into the muscle. Dr [facility]   lisinopril (ZESTRIL) 5 MG tablet Take 1 tablet (5 mg total) by mouth daily.   Multiple Vitamin (MULTI-VITAMINS) TABS Take by mouth.   pantoprazole (PROTONIX) 40 MG tablet Take 1 tablet (40 mg total) by mouth 2 (two) times daily.   [DISCONTINUED] pantoprazole (PROTONIX) 40 MG tablet Take 1 tablet by mouth 2 (two) times daily.   No facility-administered encounter medications on file as of 10/06/2021.    Allergies (verified) Nsaids and Statins   History: Past Medical History:  Diagnosis Date   Arthritis 08/28/2016   Atrophic kidney 01/13/2013   Last Assessment & Plan:  Atrophic and nonfunctional LEFT kidney.  Discussed at length today. Asymptomatic. No intervention needed.  Overview:  Last Assessment & Plan:  Atrophic and nonfunctional LEFT kidney.  Discussed at length today. Asymptomatic. No intervention needed. Last Assessment & Plan:  Atrophic  and nonfunctional LEFT kidney.  Discussed at length today. Asymptomatic. No intervention needed.   Cancer [facility])    COPD (chronic obstructive pulmonary disease) ([place]) 08/28/2016   Diabetes mellitus type 2, uncomplicated ([place]) 5/85/2778   diet controlled   ED (erectile dysfunction) 08/28/2016   GERD (gastroesophageal reflux disease)    Gross hematuria 07/22/2016   Overview:  Last Assessment & Plan:  The differential diagnosis for hematuria  was reviewed.  Possible etiologies include, but are not limited to urolithiasis, infection, urothelial or renal malignancies, medical renal disease, and benign idiopathic hematuria.  The workup was reviewed and he will need a urinary cytology testing, a CT urogram, and Cystourethroscopy.  He has moved to [place], [place] and would like to have all this done with a local urologist since it is an hour and a half drive. Will send records there and have the work performed locally.   > 15 minutes were spent in face to face contact with the patient, >50% of which was spent counseling about about the diagnosis and plan.  Last Assessment & Plan:  The differential diagnosis for hematuria was reviewed.  Possible etiologies include, but are not limited to urolithiasis, infection, urothelial or renal malignancies, medical renal disease, and benign idiopathic hematuria.  The workup was reviewed and he will need a urinary cytology testing,    H/O vagotomy 10/11/2012   Hyperlipemia    Hypertension 08/28/2016   (Pt denies, Lisinopril for kidney protection)   Kidney stones 08/28/2016   Last Assessment & Plan:  Can't see stone on KUB today again   Pt instructed to call or go to ER for acute flank pain since he only has one functioning kidney.   Currently no evidence of any obstructive uropathy.  Check chem 8 and call with results.   F/u one year for KUB   Knee contusion 10/20/2013   Peptic ulcer 08/28/2016   Peripheral neuropathy    Prostate cancer [facility])    Sarcoidosis 04/04/2015   Status post partial gastrectomy 10/11/2012   Thyroid nodule    Wears dentures    partial upper and lower   Wears hearing aid in both ears    Past Surgical History:  Procedure Laterality Date   APPENDECTOMY     CHOLECYSTECTOMY     COLONOSCOPY WITH PROPOFOL N/A 10/30/2019   Procedure: COLONOSCOPY WITH BIOPSY;  Surgeon: Lucilla Lame, MD;  Location: [place];  Service: Endoscopy;  Laterality: N/A;  Diabetic - diet controlled priority 4    EYE SURGERY     bialteral cataract extraction   GASTRECTOMY     INSERTION PROSTATE RADIATION SEED  2002   Brachytherapy   nasal tumor     POLYPECTOMY N/A 10/30/2019   Procedure: POLYPECTOMY;  Surgeon: Lucilla Lame, MD;  Location: [place];  Service: Endoscopy;  Laterality: N/A;   Family History  Problem Relation Age of Onset   Cancer Mother        lung   Diabetes Father    Congestive Heart Failure Father    Cancer Brother    Prostate cancer Neg Hx    Kidney cancer Neg Hx    Social History   Socioeconomic History   Marital status: Divorced    Spouse name: Not on file   Number of children: 2   Years of education: Not on file   Highest education level: Bachelor's degree (e.g., BA, AB, BS)  Occupational History    Employer: [employer]  Tobacco Use   Smoking status:  Former    Packs/day: 2.00    Years: 35.00    Pack years: 70.00    Types: Cigarettes    Quit date: 05/04/1996    Years since quitting: 25.4   Smokeless tobacco: Never   Tobacco comments:    smoking cessation materials not required  Vaping Use   Vaping Use: Never used  Substance and Sexual Activity   Alcohol use: Yes    Comment: Occasional beer   Drug use: No   Sexual activity: Not Currently    Birth control/protection: None  Other Topics Concern   Not on file  Social History Narrative   Pt's sister lives with him   Social Determinants of Health   Financial Resource Strain: Low Risk    Difficulty of Paying Living Expenses: Not hard at all  Food Insecurity: No Food Insecurity   Worried About Charity fundraiser in the Last Year: Never true   Ran Out of Food in the Last Year: Never true  Transportation Needs: No Transportation Needs   Lack of Transportation (Medical): No   Lack of Transportation (Non-Medical): No  Physical Activity: Insufficiently Active   Days of Exercise per Week: 3 days   Minutes of Exercise per Session: 30 min  Stress: No Stress Concern Present   Feeling of Stress :  Not at all  Social Connections: Socially Isolated   Frequency of Communication with Friends and Family: More than three times a week   Frequency of Social Gatherings with Friends and Family: Twice a week   Attends Religious Services: Never   Marine scientist or Organizations: No   Attends Music therapist: Never   Marital Status: Divorced    Tobacco Counseling Counseling given: Not Answered Tobacco comments: smoking cessation materials not required   Clinical Intake:  Pre-visit preparation completed: Yes  Pain : No/denies pain     Nutritional Risks: None Diabetes: Yes CBG done?: No Did pt. bring in CBG monitor from home?: No  How often do you need to have someone help you when you read instructions, pamphlets, or other written materials from your doctor or pharmacy?: 1 - Never Nutrition Risk Assessment:  Has the patient had any N/V/D within the last 2 months?  Yes Does the patient have any non-healing wounds?  No  Has the patient had any unintentional weight loss or weight gain?  No   Diabetes:  Is the patient diabetic?  Yes  If diabetic, was a CBG obtained today?  No  Did the patient bring in their glucometer from home?  No  How often do you monitor your CBG's? Pt does not actively check blood sugar.   Financial Strains and Diabetes Management:  Are you having any financial strains with the device, your supplies or your medication? No .  Does the patient want to be seen by Chronic Care Management for management of their diabetes?  No  Would the patient like to be referred to a Nutritionist or for Diabetic Management?  No   Diabetic Exams:  Diabetic Eye Exam: Completed 05/09/21.   Diabetic Foot Exam: Completed 09/03/21.   Interpreter Needed?: No  Information entered by :: Clemetine Marker LPN   Activities of Daily Living    10/06/2021    1:40 PM 09/03/2021    9:24 AM  In your present state of health, do you have any difficulty performing the  following activities:  Hearing? 1 1  Comment wears hearing aids   Vision? 0 0  Difficulty concentrating or making decisions? 0 0  Walking or climbing stairs? 0 0  Dressing or bathing? 0 0  Doing errands, shopping? 0 0  Preparing Food and eating ? N   Using the Toilet? N   In the past six months, have you accidently leaked urine? N   Do you have problems with loss of bowel control? Y   Comment occasional   Managing your Medications? N   Managing your Finances? N   Housekeeping or managing your Housekeeping? N     Patient Care Team: Juline Patch, MD as PCP - General (Family Medicine) Elza Rafter, MD as Consulting Physician (Internal Medicine) Lonia Farber, MD as Consulting Physician (Internal Medicine)  Indicate any recent Medical Services you may have received from other than [facility] providers in the past year (date may be approximate).     Assessment:   This is a routine wellness examination for Juan [name].  Hearing/Vision screen Hearing Screening - Comments:: Pt wears hearing aids  Vision Screening - Comments:: Annual vision screenings done at [facility] issues and exercise activities discussed: Current Exercise Habits: Home exercise routine, Type of exercise: walking;strength training/weights;calisthenics, Time (Minutes): 30, Frequency (Times/Week): 3, Weekly Exercise (Minutes/Week): 90, Intensity: Moderate, Exercise limited by: None identified   Goals Addressed   None    Depression Screen    10/06/2021    1:38 PM 09/09/2021    8:57 AM 09/03/2021    9:24 AM 03/06/2021    8:58 AM 10/02/2020    1:37 PM 09/18/2020    9:02 AM 03/19/2020    9:02 AM  PHQ 2/9 Scores  PHQ - 2 Score 0 0 0 0 0 0 0  PHQ- 9 Score 0 0 2 0  0 0    Fall Risk    10/06/2021    1:40 PM 09/09/2021    8:58 AM 10/02/2020    1:38 PM 03/19/2020    9:02 AM 02/02/2020   11:27 AM  [place] in the past year? 0 0 0 0 0  Number falls in past yr: 0  0    Injury with Fall? 0 0 0     Risk for fall due to : No Fall Risks  No Fall Risks    Follow up Falls prevention discussed  Falls prevention discussed Falls evaluation completed Falls evaluation completed    [place]:  Any stairs in or around the home? Yes  If so, are there any without handrails? No  Home free of loose throw rugs in walkways, pet beds, electrical cords, etc? Yes  Adequate lighting in your home to reduce risk of falls? Yes   ASSISTIVE DEVICES UTILIZED TO PREVENT FALLS:  Life alert? No  Use of a cane, walker or w/c? No  Grab bars in the bathroom? Yes  Shower chair or bench in shower? Yes  Elevated toilet seat or a handicapped toilet? No   TIMED UP AND GO:  Was the test performed? No . Telephonic visit.  Cognitive Function: Normal cognitive status assessed by direct observation by this Nurse Health Advisor. No abnormalities found.          09/21/2018    1:49 PM 09/20/2017    1:56 PM  6CIT Screen  What Year? 0 points 0 points  What month? 0 points 0 points  What time? 0 points 0 points  Count back from 20 0 points 0 points  Months in reverse 0  points 0 points  Repeat phrase 0 points 0 points  Total Score 0 points 0 points    Immunizations Immunization History  Administered Date(s) Administered   Fluad Quad(high Dose 65+) 02/19/2020   Influenza, High Dose Seasonal PF 01/23/2016, 03/01/2017, 03/29/2018, 03/22/2019, 02/28/2021   Influenza-Unspecified 03/01/2017, 03/22/2019   Moderna Covid-19 Vaccine Bivalent Booster 66yr & up 02/28/2021   Moderna Sars-Covid-2 Vaccination 03/07/2020   PFIZER Comirnaty(Gray Top)Covid-19 Tri-Sucrose Vaccine 10/30/2020   PFIZER(Purple Top)SARS-COV-2 Vaccination 08/09/2019, 08/30/2019   Pneumococcal Conjugate-13 03/01/2017   Pneumococcal Polysaccharide-23 03/21/2009, 04/01/2015   Tdap 10/15/2013   Tetanus 11/09/2006   Zoster, Live 05/04/2008    TDAP status: Up to date  Flu Vaccine status: Up to  date  Pneumococcal vaccine status: Up to date  Covid-19 vaccine status: Completed vaccines  Qualifies for Shingles Vaccine? Yes   Zostavax completed Yes   Shingrix Completed?: No.    Education has been provided regarding the importance of this vaccine. Patient has been advised to call insurance company to determine out of pocket expense if they have not yet received this vaccine. Advised may also receive vaccine at local pharmacy or Health Dept. Verbalized acceptance and understanding.  Screening Tests Health Maintenance  Topic Date Due   Zoster Vaccines- Shingrix (1 of 2) 12/04/2021 (Originally 11/09/1969)   INFLUENZA VACCINE  12/02/2021   HEMOGLOBIN A1C  03/06/2022   OPHTHALMOLOGY EXAM  05/09/2022   FOOT EXAM  09/04/2022   TETANUS/TDAP  10/16/2023   COLONOSCOPY (Pts 45-459yrInsurance coverage will need to be confirmed)  10/29/2024   Pneumonia Vaccine 6524Years old  Completed   COVID-19 Vaccine  Completed   Hepatitis C Screening  Completed   HPV VACCINES  Aged Out    Health Maintenance  There are no preventive care reminders to display for this patient.  Colorectal cancer screening: Type of screening: Colonoscopy. Completed 10/30/19. Repeat every 5 years  Lung Cancer Screening: (Low Dose CT Chest recommended if Age 73-80ears, 30 pack-year currently smoking OR have quit w/in 15years.) does not qualify.   Additional Screening:  Hepatitis C Screening: does qualify; Completed 06/30/17  Vision Screening: Recommended annual ophthalmology exams for early detection of glaucoma and other disorders of the eye. Is the patient up to date with their annual eye exam?  Yes  Who is the provider or what is the name of the office in which the patient attends annual eye exams? [facility]: Recommended annual dental exams for proper oral hygiene  Community Resource Referral / Chronic Care Management: CRR required this visit?  No   CCM required this visit?  No       Plan:     I have personally reviewed and noted the following in the patient's chart:   Medical and social history Use of alcohol, tobacco or illicit drugs  Current medications and supplements including opioid prescriptions. Patient is not currently taking opioid prescriptions. Functional ability and status Nutritional status Physical activity Advanced directives List of other physicians Hospitalizations, surgeries, and ER visits in previous 12 months Vitals Screenings to include cognitive, depression, and falls Referrals and appointments  In addition, I have reviewed and discussed with patient certain preventive protocols, quality metrics, and best practice recommendations. A written personalized care plan for preventive services as well as general preventive health recommendations were provided to patient.     KaClemetine MarkerLPN   6/07/10/4663 Nurse Notes: none

## 2021-10-06 NOTE — Patient Instructions (Signed)
Mr. Juan [name] , Thank you for taking time to come for your Medicare Wellness Visit. I appreciate your ongoing commitment to your health goals. Please review the following plan we discussed and let me know if I can assist you in the future.   Screening recommendations/referrals: Colonoscopy: done 10/30/19.repeat 10/2024 Recommended yearly ophthalmology/optometry visit for glaucoma screening and checkup Recommended yearly dental visit for hygiene and checkup  Vaccinations: Influenza vaccine: done 02/28/21 Pneumococcal vaccine: done 03/01/17 Tdap vaccine: done 10/15/13 Shingles vaccine: Shingrix discussed. Please contact your pharmacy for coverage information.  Covid-19: done 08/09/19, 08/30/19, 03/07/20,10/30/20 & 02/28/21  Advanced directives: Advance directive discussed with you today. Even though you declined this today please call our office should you change your mind and we can give you the proper paperwork for you to fill out.   Conditions/risks identified: Keep up the great work!  Next appointment: Follow up in one year for your annual wellness visit.   Preventive Care 49 Years and Older, Male Preventive care refers to lifestyle choices and visits with your health care provider that can promote health and wellness. What does preventive care include? A yearly physical exam. This is also called an annual well check. Dental exams once or twice a year. Routine eye exams. Ask your health care provider how often you should have your eyes checked. Personal lifestyle choices, including: Daily care of your teeth and gums. Regular physical activity. Eating a healthy diet. Avoiding tobacco and drug use. Limiting alcohol use. Practicing safe sex. Taking low doses of aspirin every day. Taking vitamin and mineral supplements as recommended by your health care provider. What happens during an annual well check? The services and screenings done by your health care provider during your annual well  check will depend on your age, overall health, lifestyle risk factors, and family history of disease. Counseling  Your health care provider may ask you questions about your: Alcohol use. Tobacco use. Drug use. Emotional well-being. Home and relationship well-being. Sexual activity. Eating habits. History of falls. Memory and ability to understand (cognition). Work and work Statistician. Screening  You may have the following tests or measurements: Height, weight, and BMI. Blood pressure. Lipid and cholesterol levels. These may be checked every 5 years, or more frequently if you are over 18 years old. Skin check. Lung cancer screening. You may have this screening every year starting at age 61 if you have a 30-pack-year history of smoking and currently smoke or have quit within the past 15 years. Fecal occult blood test (FOBT) of the stool. You may have this test every year starting at age 26. Flexible sigmoidoscopy or colonoscopy. You may have a sigmoidoscopy every 5 years or a colonoscopy every 10 years starting at age 34. Prostate cancer screening. Recommendations will vary depending on your family history and other risks. Hepatitis C blood test. Hepatitis B blood test. Sexually transmitted disease (STD) testing. Diabetes screening. This is done by checking your blood sugar (glucose) after you have not eaten for a while (fasting). You may have this done every 1-3 years. Abdominal aortic aneurysm (AAA) screening. You may need this if you are a current or former smoker. Osteoporosis. You may be screened starting at age 17 if you are at high risk. Talk with your health care provider about your test results, treatment options, and if necessary, the need for more tests. Vaccines  Your health care provider may recommend certain vaccines, such as: Influenza vaccine. This is recommended every year. Tetanus, diphtheria, and acellular pertussis (Tdap,  Td) vaccine. You may need a Td booster  every 10 years. Zoster vaccine. You may need this after age 106. Pneumococcal 13-valent conjugate (PCV13) vaccine. One dose is recommended after age 57. Pneumococcal polysaccharide (PPSV23) vaccine. One dose is recommended after age 34. Talk to your health care provider about which screenings and vaccines you need and how often you need them. This information is not intended to replace advice given to you by your health care provider. Make sure you discuss any questions you have with your health care provider. Document Released: 05/17/2015 Document Revised: 01/08/2016 Document Reviewed: 02/19/2015 Elsevier Interactive Patient Education  2017 [place] Prevention in the Home Falls can cause injuries. They can happen to people of all ages. There are many things you can do to make your home safe and to help prevent falls. What can I do on the outside of my home? Regularly fix the edges of walkways and driveways and fix any cracks. Remove anything that might make you trip as you walk through a door, such as a raised step or threshold. Trim any bushes or trees on the path to your home. Use bright outdoor lighting. Clear any walking paths of anything that might make someone trip, such as rocks or tools. Regularly check to see if handrails are loose or broken. Make sure that both sides of any steps have handrails. Any raised decks and porches should have guardrails on the edges. Have any leaves, snow, or ice cleared regularly. Use sand or salt on walking paths during winter. Clean up any spills in your garage right away. This includes oil or grease spills. What can I do in the bathroom? Use night lights. Install grab bars by the toilet and in the tub and shower. Do not use towel bars as grab bars. Use non-skid mats or decals in the tub or shower. If you need to sit down in the shower, use a plastic, non-slip stool. Keep the floor dry. Clean up any water that spills on the floor as soon  as it happens. Remove soap buildup in the tub or shower regularly. Attach bath mats securely with double-sided non-slip rug tape. Do not have throw rugs and other things on the floor that can make you trip. What can I do in the bedroom? Use night lights. Make sure that you have a light by your bed that is easy to reach. Do not use any sheets or blankets that are too big for your bed. They should not hang down onto the floor. Have a firm chair that has side arms. You can use this for support while you get dressed. Do not have throw rugs and other things on the floor that can make you trip. What can I do in the kitchen? Clean up any spills right away. Avoid walking on wet floors. Keep items that you use a lot in easy-to-reach places. If you need to reach something above you, use a strong step stool that has a grab bar. Keep electrical cords out of the way. Do not use floor polish or wax that makes floors slippery. If you must use wax, use non-skid floor wax. Do not have throw rugs and other things on the floor that can make you trip. What can I do with my stairs? Do not leave any items on the stairs. Make sure that there are handrails on both sides of the stairs and use them. Fix handrails that are broken or loose. Make sure that handrails are as  long as the stairways. Check any carpeting to make sure that it is firmly attached to the stairs. Fix any carpet that is loose or worn. Avoid having throw rugs at the top or bottom of the stairs. If you do have throw rugs, attach them to the floor with carpet tape. Make sure that you have a light switch at the top of the stairs and the bottom of the stairs. If you do not have them, ask someone to add them for you. What else can I do to help prevent falls? Wear shoes that: Do not have high heels. Have rubber bottoms. Are comfortable and fit you well. Are closed at the toe. Do not wear sandals. If you use a stepladder: Make sure that it is fully  opened. Do not climb a closed stepladder. Make sure that both sides of the stepladder are locked into place. Ask someone to hold it for you, if possible. Clearly mark and make sure that you can see: Any grab bars or handrails. First and last steps. Where the edge of each step is. Use tools that help you move around (mobility aids) if they are needed. These include: Canes. Walkers. Scooters. Crutches. Turn on the lights when you go into a dark area. Replace any light bulbs as soon as they burn out. Set up your furniture so you have a clear path. Avoid moving your furniture around. If any of your floors are uneven, fix them. If there are any pets around you, be aware of where they are. Review your medicines with your doctor. Some medicines can make you feel dizzy. This can increase your chance of falling. Ask your doctor what other things that you can do to help prevent falls. This information is not intended to replace advice given to you by your health care provider. Make sure you discuss any questions you have with your health care provider. Document Released: 02/14/2009 Document Revised: 09/26/2015 Document Reviewed: 05/25/2014 Elsevier Interactive Patient Education  2017 [employer].

## 2021-10-07 NOTE — Telephone Encounter (Signed)
Requested medication (s) are due for refill today: Yes  Requested medication (s) are on the active medication list: Yes  Last refill:  09/09/21  Future visit scheduled: Yes  Notes to clinic:  Protocol indicates pt. Needs lab work.    Requested Prescriptions  Pending Prescriptions Disp Refills   diclofenac Sodium (VOLTAREN) 1 % GEL [Pharmacy Med Name: DICLOFENAC SODIUM 1% GEL] 100 g 0    Sig: Apply 2 g topically 4 (four) times daily as needed. To affected joint.     Analgesics:  Topicals Failed - 10/06/2021  1:53 AM      Failed - Manual Review: Labs are only required if the patient has taken medication for more than 8 weeks.      Failed - PLT in normal range and within 360 days    Platelets  Date Value Ref Range Status  05/11/2018 444 150 - 450 x10E3/uL Final         Failed - HGB in normal range and within 360 days    Hemoglobin  Date Value Ref Range Status  05/11/2018 13.3 13.0 - 17.7 g/dL Final         Failed - HCT in normal range and within 360 days    Hematocrit  Date Value Ref Range Status  05/11/2018 38.3 37.5 - 51.0 % Final         Failed - Cr in normal range and within 360 days    Creatinine, Ser  Date Value Ref Range Status  10/06/2019 1.18 0.76 - 1.27 mg/dL Final         Failed - eGFR is 30 or above and within 360 days    GFR calc Af Amer  Date Value Ref Range Status  10/06/2019 72 >59 mL/min/1.73 Final    Comment:    **[facility] currently reports eGFR in compliance with the current**   recommendations of the [employer]. [facility] will   update reporting as new guidelines are published from the NKF-ASN   Task force.    GFR calc non Af Amer  Date Value Ref Range Status  10/06/2019 62 >59 mL/min/1.73 Final         Passed - Patient is not pregnant      Passed - Valid encounter within last 12 months    Recent Outpatient Visits           4 weeks ago Osteoarthritis of fingers of hands, bilateral   [facility] Montel Culver,  MD   1 month ago Essential hypertension   [place], Deanna C, MD   7 months ago Essential hypertension   [place], Deanna C, MD   1 year ago Type 2 diabetes, controlled, with neuropathy [facility])   [facility] Juline Patch, MD   1 year ago Type 2 diabetes, controlled, with neuropathy [facility])   [facility] Juline Patch, MD       Future Appointments             In 5 months Juline Patch, MD [facility], [facility]

## 2021-10-07 NOTE — Telephone Encounter (Signed)
Please advise 

## 2021-11-24 DIAGNOSIS — H903 Sensorineural hearing loss, bilateral: Secondary | ICD-10-CM | POA: Diagnosis not present

## 2021-12-23 DIAGNOSIS — E559 Vitamin D deficiency, unspecified: Secondary | ICD-10-CM | POA: Diagnosis not present

## 2021-12-23 DIAGNOSIS — C61 Malignant neoplasm of prostate: Secondary | ICD-10-CM | POA: Diagnosis not present

## 2022-02-26 DIAGNOSIS — Z23 Encounter for immunization: Secondary | ICD-10-CM | POA: Diagnosis not present

## 2022-03-06 ENCOUNTER — Ambulatory Visit: Payer: Medicare Other | Admitting: Family Medicine

## 2022-03-09 ENCOUNTER — Ambulatory Visit (INDEPENDENT_AMBULATORY_CARE_PROVIDER_SITE_OTHER): Payer: Medicare Other | Admitting: Family Medicine

## 2022-03-09 ENCOUNTER — Encounter: Payer: Self-pay | Admitting: Family Medicine

## 2022-03-09 ENCOUNTER — Other Ambulatory Visit
Admission: RE | Admit: 2022-03-09 | Discharge: 2022-03-09 | Disposition: A | Discharge status: Home / Self Care | Payer: Medicare Other | Attending: Family Medicine | Admitting: Family Medicine

## 2022-03-09 VITALS — BP 138/86 | HR 62 | Ht 68.0 in | Wt 199.0 lb

## 2022-03-09 DIAGNOSIS — E1140 Type 2 diabetes mellitus with diabetic neuropathy, unspecified: Secondary | ICD-10-CM | POA: Diagnosis not present

## 2022-03-09 DIAGNOSIS — K219 Gastro-esophageal reflux disease without esophagitis: Secondary | ICD-10-CM | POA: Diagnosis not present

## 2022-03-09 DIAGNOSIS — R7303 Prediabetes: Secondary | ICD-10-CM | POA: Insufficient documentation

## 2022-03-09 DIAGNOSIS — I10 Essential (primary) hypertension: Secondary | ICD-10-CM

## 2022-03-09 LAB — HEMOGLOBIN A1C
Hgb A1c MFr Bld: 5.4 % (ref 4.8–5.6)
Mean Plasma Glucose: 108.28 mg/dL

## 2022-03-09 MED ORDER — PANTOPRAZOLE SODIUM 40 MG PO TBEC: 40.0000 mg | DELAYED_RELEASE_TABLET | Freq: Two times a day (BID) | ORAL | 1 refills | Status: DC

## 2022-03-09 MED ORDER — LISINOPRIL 5 MG PO TABS: 5.0000 mg | ORAL_TABLET | Freq: Every day | ORAL | 1 refills | Status: DC

## 2022-03-09 NOTE — Progress Notes (Signed)
Date:  03/09/2022   Name:  Juan [name]   DOB:  1950-11-16   MRN:  [id number]   Chief Complaint: Gastroesophageal Reflux, Hypertension (3 hours ago- took bp med), and Prediabetes  Gastroesophageal Reflux He reports no abdominal pain, no chest pain, no choking, no dysphagia, no heartburn or no nausea. This is a chronic problem. The problem has been gradually improving. The symptoms are aggravated by certain foods. Pertinent negatives include no fatigue. He has tried a PPI for the symptoms. The treatment provided moderate relief.  Hypertension This is a chronic problem. The current episode started more than 1 year ago. The problem has been gradually improving since onset. The problem is controlled. Pertinent negatives include no chest pain, palpitations, PND or shortness of breath. Past treatments include ACE inhibitors. The current treatment provides moderate improvement. There are no compliance problems.     Lab Results  Component Value Date   NA 141 10/06/2019   K 4.5 10/06/2019   CO2 21 10/06/2019   GLUCOSE 90 10/06/2019   BUN 19 10/06/2019   CREATININE 1.18 10/06/2019   CALCIUM 9.5 10/06/2019   GFRNONAA 62 10/06/2019   Lab Results  Component Value Date   CHOL 159 09/03/2021   HDL 64 09/03/2021   LDLCALC 75 09/03/2021   TRIG 110 09/03/2021   CHOLHDL 2.7 10/06/2018   Lab Results  Component Value Date   TSH 1.467 03/01/2017   Lab Results  Component Value Date   HGBA1C 5.5 09/03/2021   Lab Results  Component Value Date   WBC 8.1 05/11/2018   HGB 13.3 05/11/2018   HCT 38.3 05/11/2018   MCV 91 05/11/2018   PLT 444 05/11/2018   Lab Results  Component Value Date   ALT 17 04/28/2018   AST 25 04/28/2018   ALKPHOS 78 04/28/2018   BILITOT 1.2 04/28/2018   Lab Results  Component Value Date   VD25OH 51.5 06/30/2017     Review of Systems  Constitutional:  Negative for fatigue, fever and unexpected weight change.  HENT:  Positive for postnasal drip. Negative  for sinus pressure.   Respiratory:  Negative for choking and shortness of breath.   Cardiovascular:  Negative for chest pain, palpitations and PND.  Gastrointestinal:  Negative for abdominal pain, blood in stool, dysphagia, heartburn and nausea.  Endocrine: Negative for polydipsia and polyuria.  Genitourinary:  Negative for difficulty urinating.  Neurological:  Negative for dizziness.    Patient Active Problem List   Diagnosis Date Noted   Osteoarthritis of fingers of hands, bilateral 09/09/2021   Aortic atherosclerosis ([place]) 09/18/2020   Encounter for screening colonoscopy    Polyp of transverse colon    Multinodular goiter 08/29/2018   History of sarcoidosis 06/29/2018   Thyroid nodule 05/24/2018   Acetonemia due to secondary diabetes ([place]) 09/29/2017   Gastric hypersecretion 09/29/2017   Microalbuminuria 03/31/2017   B12 deficiency 03/01/2017   Prostate cancer ([place]) 08/28/2016   Obesity (BMI 30-39.9) 08/28/2016   Kidney stones, calcium oxalate 08/28/2016   ED (erectile dysfunction) 08/28/2016   History of duodenal ulcer 08/28/2016   Type 2 diabetes, controlled, with neuropathy ([place]) 08/28/2016   History of cholelithiasis 08/28/2016   Arthritis 08/28/2016   Gastroesophageal reflux disease 08/28/2016   Hyperlipidemia, unspecified 04/04/2015   Hyperlipidemia 04/04/2015   Vitamin D deficiency, unspecified 07/12/2013   Vitamin D deficiency 07/12/2013   Atrophic kidney 01/13/2013   Status post partial gastrectomy 10/11/2012   H/O vagotomy 10/11/2012    Allergies  Allergen  Reactions   Nsaids Other (See Comments)    Hx of PUD   Statins Rash    Hyperglycemia    Past Surgical History:  Procedure Laterality Date   APPENDECTOMY     CHOLECYSTECTOMY     COLONOSCOPY WITH PROPOFOL N/A 10/30/2019   Procedure: COLONOSCOPY WITH BIOPSY;  Surgeon: Lucilla Lame, MD;  Location: [facility];  Service: Endoscopy;  Laterality: N/A;  Diabetic - diet controlled priority 4   EYE  SURGERY     bialteral cataract extraction   GASTRECTOMY     INSERTION PROSTATE RADIATION SEED  2002   Brachytherapy   nasal tumor     POLYPECTOMY N/A 10/30/2019   Procedure: POLYPECTOMY;  Surgeon: Lucilla Lame, MD;  Location: [facility];  Service: Endoscopy;  Laterality: N/A;    Social History   Tobacco Use   Smoking status: Former    Packs/day: 2.00    Years: 35.00    Total pack years: 70.00    Types: Cigarettes    Quit date: 05/04/1996    Years since quitting: 25.8   Smokeless tobacco: Never   Tobacco comments:    smoking cessation materials not required  Vaping Use   Vaping Use: Never used  Substance Use Topics   Alcohol use: Yes    Comment: Occasional beer   Drug use: No     Medication list has been reviewed and updated.  Current Meds  Medication Sig   acetaminophen (TYLENOL) 500 MG tablet Take 1,000 mg by mouth 2 (two) times daily as needed for pain.   Biotin 1000 MCG tablet Take by mouth.   Cholecalciferol (GNP VITAMIN D MAXIMUM STRENGTH) 2000 units TABS Take 1 tablet (2,000 Units total) by mouth daily.   Cyanocobalamin (B-12) 500 MCG TABS Take 1 tablet by mouth daily.   darolutamide (NUBEQA) 300 MG tablet Take 600 mg by mouth 2 (two) times daily. berry   dutasteride (AVODART) 0.5 MG capsule Take 0.5 mg by mouth daily. Dr Gwenlyn Found   leuprolide (LUPRON) 22.5 MG injection Inject into the muscle. Dr Gwenlyn Found- cancer center   lisinopril (ZESTRIL) 5 MG tablet Take 1 tablet (5 mg total) by mouth daily.   Multiple Vitamin (MULTI-VITAMINS) TABS Take by mouth.   pantoprazole (PROTONIX) 40 MG tablet Take 1 tablet (40 mg total) by mouth 2 (two) times daily.       03/09/2022   10:46 AM 09/09/2021    8:58 AM 09/03/2021    9:24 AM 03/06/2021    8:58 AM  GAD 7 : Generalized Anxiety Score  Nervous, Anxious, on Edge 0 0 0 0  Control/stop worrying 0 0 0 0  Worry too much - different things 0 0 0 0  Trouble relaxing 0 0 0 0  Restless 0 0 0 0  Easily annoyed or irritable 0 0 0  0  Afraid - awful might happen 0 0 0 0  Total GAD 7 Score 0 0 0 0  Anxiety Difficulty Not difficult at all  Not difficult at all        03/09/2022   10:46 AM 10/06/2021    1:38 PM 09/09/2021    8:57 AM  Depression screen PHQ 2/9  Decreased Interest 0 0 0  Down, Depressed, Hopeless 0 0 0  PHQ - 2 Score 0 0 0  Altered sleeping 0 0 0  Tired, decreased energy 0 0 0  Change in appetite 0 0 0  Feeling bad or failure about yourself  0 0 0  Trouble concentrating 0 0   Moving slowly or fidgety/restless 0 0 0  Suicidal thoughts 0 0 0  PHQ-9 Score 0 0 0  Difficult doing work/chores  Not difficult at all Not difficult at all    BP Readings from Last 3 Encounters:  03/09/22 138/86  09/09/21 122/60  09/03/21 120/70    Physical Exam Vitals and nursing note reviewed.  HENT:     Head: Normocephalic.     Right Ear: Tympanic membrane and external ear normal.     Left Ear: Tympanic membrane and external ear normal.     Nose: Nose normal. No congestion or rhinorrhea.     Mouth/Throat:     Mouth: Mucous membranes are moist.     Pharynx: No oropharyngeal exudate or posterior oropharyngeal erythema.  Eyes:     General: No scleral icterus.       Right eye: No discharge.        Left eye: No discharge.     Conjunctiva/sclera: Conjunctivae normal.     Pupils: Pupils are equal, round, and reactive to light.  Neck:     Thyroid: No thyromegaly.     Vascular: No JVD.     Trachea: No tracheal deviation.  Cardiovascular:     Rate and Rhythm: Normal rate and regular rhythm.     Heart sounds: Normal heart sounds. No murmur heard.    No friction rub. No gallop.  Pulmonary:     Effort: No respiratory distress.     Breath sounds: Normal breath sounds. No wheezing, rhonchi or rales.  Abdominal:     General: Bowel sounds are normal.     Palpations: Abdomen is soft. There is no mass.     Tenderness: There is no abdominal tenderness. There is no guarding or rebound.  Musculoskeletal:        General:  No tenderness. Normal range of motion.     Cervical back: Normal range of motion and neck supple.  Lymphadenopathy:     Cervical: No cervical adenopathy.  Skin:    General: Skin is warm.     Findings: No rash.  Neurological:     Mental Status: He is alert and oriented to person, place, and time.     Cranial Nerves: No cranial nerve deficit.     Deep Tendon Reflexes: Reflexes are normal and symmetric.     Wt Readings from Last 3 Encounters:  03/09/22 199 lb (90.3 kg)  09/09/21 210 lb (95.3 kg)  09/03/21 208 lb (94.3 kg)    BP 138/86 (BP Location: Right Arm, Cuff Size: Large)   Pulse 62   Ht '5\' 8"'$  (1.727 m)   Wt 199 lb (90.3 kg)   SpO2 96%   BMI 30.26 kg/m   Assessment and Plan:  1. Essential hypertension Chronic.  Controlled.  Stable.  Blood pressure 138/86.  Asymptomatic.  Tolerating medication well.  Continue lisinopril 5 mg once a day.  We will recheck in 6 months. - lisinopril (ZESTRIL) 5 MG tablet; Take 1 tablet (5 mg total) by mouth daily.  Dispense: 90 tablet; Refill: 1  2. Gastroesophageal reflux disease, unspecified whether esophagitis present Chronic.  Controlled.  Stable.  Continue pantoprazole 40 mg twice a day.  We will continue at current dosing and will recheck in 6 months. - pantoprazole (PROTONIX) 40 MG tablet; Take 1 tablet (40 mg total) by mouth 2 (two) times daily.  Dispense: 180 tablet; Refill: 1  3. Type 2 diabetes, controlled, with neuropathy (HCC) Chronic.  Diet controlled.  Stable.  Will check A1c and that he has had some weight loss as last check.  We will also check microalbuminuria. - HgB A1c - Urine microalbumin-creatinine with uACR    Otilio Miu, MD

## 2022-03-11 LAB — MICROALBUMIN / CREATININE URINE RATIO
Creatinine, Urine: 43 mg/dL
Microalb Creat Ratio: 16 mg/g creat (ref 0–29)
Microalb, Ur: 6.8 ug/mL — ABNORMAL HIGH

## 2022-03-25 DIAGNOSIS — E559 Vitamin D deficiency, unspecified: Secondary | ICD-10-CM | POA: Diagnosis not present

## 2022-03-25 DIAGNOSIS — C61 Malignant neoplasm of prostate: Secondary | ICD-10-CM | POA: Diagnosis not present

## 2022-05-15 DIAGNOSIS — E113293 Type 2 diabetes mellitus with mild nonproliferative diabetic retinopathy without macular edema, bilateral: Secondary | ICD-10-CM | POA: Diagnosis not present

## 2022-05-15 LAB — HM DIABETES EYE EXAM

## 2022-05-20 DIAGNOSIS — E042 Nontoxic multinodular goiter: Secondary | ICD-10-CM | POA: Diagnosis not present

## 2022-05-29 DIAGNOSIS — E042 Nontoxic multinodular goiter: Secondary | ICD-10-CM | POA: Diagnosis not present

## 2022-06-26 DIAGNOSIS — C61 Malignant neoplasm of prostate: Secondary | ICD-10-CM | POA: Diagnosis not present

## 2022-09-07 ENCOUNTER — Ambulatory Visit (INDEPENDENT_AMBULATORY_CARE_PROVIDER_SITE_OTHER): Payer: Medicare Other | Admitting: Family Medicine

## 2022-09-07 ENCOUNTER — Encounter: Payer: Self-pay | Admitting: Family Medicine

## 2022-09-07 VITALS — BP 118/60 | HR 74 | Ht 68.0 in | Wt 202.0 lb

## 2022-09-07 DIAGNOSIS — Z8639 Personal history of other endocrine, nutritional and metabolic disease: Secondary | ICD-10-CM | POA: Diagnosis not present

## 2022-09-07 DIAGNOSIS — K219 Gastro-esophageal reflux disease without esophagitis: Secondary | ICD-10-CM | POA: Diagnosis not present

## 2022-09-07 DIAGNOSIS — I10 Essential (primary) hypertension: Secondary | ICD-10-CM | POA: Diagnosis not present

## 2022-09-07 DIAGNOSIS — L84 Corns and callosities: Secondary | ICD-10-CM

## 2022-09-07 MED ORDER — LISINOPRIL 5 MG PO TABS: 5.0000 mg | ORAL_TABLET | Freq: Every day | ORAL | 1 refills | Status: DC

## 2022-09-07 MED ORDER — PANTOPRAZOLE SODIUM 40 MG PO TBEC: 40.0000 mg | DELAYED_RELEASE_TABLET | Freq: Two times a day (BID) | ORAL | 1 refills | Status: DC

## 2022-09-07 NOTE — Progress Notes (Signed)
Date:  09/07/2022   Name:  Juan [name]   DOB:  26-Jul-1950   MRN:  [id number]   Chief Complaint: Prediabetes (Foot exam- has foot pain in L) foot), Hypertension, and Gastroesophageal Reflux  Hypertension This is a chronic problem. The current episode started more than 1 year ago. The problem has been gradually improving since onset. The problem is controlled. Pertinent negatives include no chest pain, headaches, orthopnea, palpitations, peripheral edema, PND or shortness of breath. There are no associated agents to hypertension. Risk factors for coronary artery disease include diabetes mellitus. Past treatments include ACE inhibitors. The current treatment provides moderate improvement. There are no compliance problems.  There is no history of CAD/MI or CVA. There is no history of chronic renal disease, a hypertension causing med or renovascular disease.  Gastroesophageal Reflux He reports no abdominal pain, no chest pain, no coughing, no dysphagia, no heartburn, no nausea, no sore throat or no wheezing. This is a chronic problem. The current episode started more than 1 year ago. The problem has been waxing and waning. The symptoms are aggravated by certain foods. He has tried a PPI for the symptoms. The treatment provided moderate relief.    Lab Results  Component Value Date   NA 141 10/06/2019   K 4.5 10/06/2019   CO2 21 10/06/2019   GLUCOSE 90 10/06/2019   BUN 19 10/06/2019   CREATININE 1.18 10/06/2019   CALCIUM 9.5 10/06/2019   GFRNONAA 62 10/06/2019   Lab Results  Component Value Date   CHOL 159 09/03/2021   HDL 64 09/03/2021   LDLCALC 75 09/03/2021   TRIG 110 09/03/2021   CHOLHDL 2.7 10/06/2018   Lab Results  Component Value Date   TSH 1.467 03/01/2017   Lab Results  Component Value Date   HGBA1C 5.4 03/09/2022   Lab Results  Component Value Date   WBC 8.1 05/11/2018   HGB 13.3 05/11/2018   HCT 38.3 05/11/2018   MCV 91 05/11/2018   PLT 444 05/11/2018   Lab  Results  Component Value Date   ALT 17 04/28/2018   AST 25 04/28/2018   ALKPHOS 78 04/28/2018   BILITOT 1.2 04/28/2018   Lab Results  Component Value Date   VD25OH 51.5 06/30/2017     Review of Systems  Constitutional:  Positive for unexpected weight change. Negative for diaphoresis and fever.  HENT:  Negative for sore throat and trouble swallowing.   Eyes:  Negative for photophobia.  Respiratory:  Negative for cough, chest tightness, shortness of breath and wheezing.   Cardiovascular:  Negative for chest pain, palpitations, orthopnea and PND.  Gastrointestinal:  Negative for abdominal pain, dysphagia, heartburn and nausea.  Endocrine: Negative for polydipsia and polyuria.  Genitourinary:  Negative for difficulty urinating.  Neurological:  Negative for headaches.    Patient Active Problem List   Diagnosis Date Noted   Osteoarthritis of fingers of hands, bilateral 09/09/2021   Aortic atherosclerosis (HCC) 09/18/2020   Encounter for screening colonoscopy    Polyp of transverse colon    Multinodular goiter 08/29/2018   History of sarcoidosis 06/29/2018   Thyroid nodule 05/24/2018   Acetonemia due to secondary diabetes (HCC) 09/29/2017   Gastric hypersecretion 09/29/2017   Microalbuminuria 03/31/2017   B12 deficiency 03/01/2017   Prostate cancer (HCC) 08/28/2016   Obesity (BMI 30-39.9) 08/28/2016   Kidney stones, calcium oxalate 08/28/2016   ED (erectile dysfunction) 08/28/2016   History of duodenal ulcer 08/28/2016   Type 2 diabetes, controlled, with neuropathy (  HCC) 08/28/2016   History of cholelithiasis 08/28/2016   Arthritis 08/28/2016   Gastroesophageal reflux disease 08/28/2016   Hyperlipidemia, unspecified 04/04/2015   Hyperlipidemia 04/04/2015   Vitamin D deficiency, unspecified 07/12/2013   Vitamin D deficiency 07/12/2013   Atrophic kidney 01/13/2013   Status post partial gastrectomy 10/11/2012   H/O vagotomy 10/11/2012    Allergies  Allergen Reactions    Nsaids Other (See Comments)    Hx of PUD   Statins Rash    Hyperglycemia    Past Surgical History:  Procedure Laterality Date   APPENDECTOMY     CHOLECYSTECTOMY     COLONOSCOPY WITH PROPOFOL N/A 10/30/2019   Procedure: COLONOSCOPY WITH BIOPSY;  Surgeon: Midge Minium, MD;  Location[facility];  Service: Endoscopy;  Laterality: N/A;  Diabetic - diet controlled priority 4   EYE SURGERY     bialteral cataract extraction   GASTRECTOMY     INSERTION PROSTATE RADIATION SEED  2002   Brachytherapy   nasal tumor     POLYPECTOMY N/A 10/30/2019   Procedure: POLYPECTOMY;  Surgeon: Midge Minium, MD;  Location: [facility];  Service: Endoscopy;  Laterality: N/A;    Social History   Tobacco Use   Smoking status: Former    Packs/day: 2.00    Years: 35.00    Additional pack years: 0.00    Total pack years: 70.00    Types: Cigarettes    Quit date: 05/04/1996    Years since quitting: 26.3   Smokeless tobacco: Never   Tobacco comments:    smoking cessation materials not required  Vaping Use   Vaping Use: Never used  Substance Use Topics   Alcohol use: Yes    Comment: Occasional beer   Drug use: No     Medication list has been reviewed and updated.  Current Meds  Medication Sig   acetaminophen (TYLENOL) 500 MG tablet Take 1,000 mg by mouth 2 (two) times daily as needed for pain.   Biotin 1000 MCG tablet Take by mouth.   Cholecalciferol (GNP VITAMIN D MAXIMUM STRENGTH) 2000 units TABS Take 1 tablet (2,000 Units total) by mouth daily.   Cyanocobalamin (B-12) 500 MCG TABS Take 1 tablet by mouth daily.   darolutamide (NUBEQA) 300 MG tablet Take 600 mg by mouth 2 (two) times daily. berry   dutasteride (AVODART) 0.5 MG capsule Take 0.5 mg by mouth daily. Dr Allyson Sabal   leuprolide (LUPRON) 22.5 MG injection Inject into the muscle. Dr Allyson Sabal- cancer center   lisinopril (ZESTRIL) 5 MG tablet Take 1 tablet (5 mg total) by mouth daily.   Multiple Vitamin (MULTI-VITAMINS) TABS Take  by mouth.   pantoprazole (PROTONIX) 40 MG tablet Take 1 tablet (40 mg total) by mouth 2 (two) times daily.       09/07/2022    1:23 PM 03/09/2022   10:46 AM 09/09/2021    8:58 AM 09/03/2021    9:24 AM  GAD 7 : Generalized Anxiety Score  Nervous, Anxious, on Edge 0 0 0 0  Control/stop worrying 0 0 0 0  Worry too much - different things 0 0 0 0  Trouble relaxing 0 0 0 0  Restless 0 0 0 0  Easily annoyed or irritable 0 0 0 0  Afraid - awful might happen 0 0 0 0  Total GAD 7 Score 0 0 0 0  Anxiety Difficulty Not difficult at all Not difficult at all  Not difficult at all       09/07/2022  1:23 PM 03/09/2022   10:46 AM 10/06/2021    1:38 PM  Depression screen PHQ 2/9  Decreased Interest 0 0 0  Down, Depressed, Hopeless 0 0 0  PHQ - 2 Score 0 0 0  Altered sleeping 0 0 0  Tired, decreased energy 0 0 0  Change in appetite 0 0 0  Feeling bad or failure about yourself  0 0 0  Trouble concentrating 0 0 0  Moving slowly or fidgety/restless 0 0 0  Suicidal thoughts 0 0 0  PHQ-9 Score 0 0 0  Difficult doing work/chores Not difficult at all  Not difficult at all    BP Readings from Last 3 Encounters:  09/07/22 118/60  03/09/22 138/86  09/09/21 122/60    Physical Exam Vitals and nursing note reviewed.  HENT:     Head: Normocephalic.     Right Ear: External ear normal.     Left Ear: External ear normal.     Nose: Nose normal.  Eyes:     General: No scleral icterus.       Right eye: No discharge.        Left eye: No discharge.     Conjunctiva/sclera: Conjunctivae normal.     Pupils: Pupils are equal, round, and reactive to light.  Neck:     Thyroid: No thyromegaly.     Vascular: No JVD.     Trachea: No tracheal deviation.  Cardiovascular:     Rate and Rhythm: Normal rate and regular rhythm.     Heart sounds: Normal heart sounds. No murmur heard.    No friction rub. No gallop.  Pulmonary:     Effort: No respiratory distress.     Breath sounds: Normal breath sounds. No  wheezing or rales.  Abdominal:     General: Bowel sounds are normal.     Palpations: Abdomen is soft. There is no mass.     Tenderness: There is no abdominal tenderness. There is no guarding or rebound.  Musculoskeletal:        General: No tenderness. Normal range of motion.     Cervical back: Normal range of motion and neck supple.  Lymphadenopathy:     Cervical: No cervical adenopathy.  Skin:    General: Skin is warm.     Findings: No rash.  Neurological:     Mental Status: He is alert and oriented to person, place, and time.     Cranial Nerves: No cranial nerve deficit.     Deep Tendon Reflexes: Reflexes are normal and symmetric.     Wt Readings from Last 3 Encounters:  09/07/22 202 lb (91.6 kg)  03/09/22 199 lb (90.3 kg)  09/09/21 210 lb (95.3 kg)    BP 118/60   Pulse 74   Ht 5\' 8"  (1.727 m)   Wt 202 lb (91.6 kg)   SpO2 96%   BMI 30.71 kg/m   Assessment and Plan: 1. Gastroesophageal reflux disease, unspecified whether esophagitis present Chronic.  Controlled.  Stable.  Continue pantoprazole 40 mg twice a day.  Will recheck an 6 months. - pantoprazole (PROTONIX) 40 MG tablet; Take 1 tablet (40 mg total) by mouth 2 (two) times daily.  Dispense: 180 tablet; Refill: 1  2. Essential hypertension Chronic.  Controlled.  Stable.  Blood pressure 118/60.  Continue lisinopril 5 mg once a day.  Will recheck in 6 months. - lisinopril (ZESTRIL) 5 MG tablet; Take 1 tablet (5 mg total) by mouth daily.  Dispense: 90 tablet; Refill:  1  3. History of diet-controlled diabetes Chronic.  Controlled.  Stable.  Currently is controlled with diet with A1c is in control range without medication.  Patient underwent a surgical reroute at of the stomach due to bleeding ulcers which ultimately created a weight loss situation.  Patient has continued to lose weight but is relatively stable now with weight gain weight loss within a 10 pound range over the past year.  Will check A1c and lipid panel as  for the time being and foot exam was done today which was unremarkable except for a callus on the left foot see below - HgB A1c - Lipid Panel With LDL/HDL Ratio  4. Foot callus New onset over the course of 1 month noticed a swelling when walking over the distal portion of the left foot at the base of the fifth toe plantar surface with hyperkeratosis consistent with callus versus perhaps a plantar wart.  Referral to [facility] for evaluation and treatment. - Ambulatory referral to Podiatry     Elizabeth Sauer, MD

## 2022-09-08 LAB — HEMOGLOBIN A1C
Est. average glucose Bld gHb Est-mCnc: 114 mg/dL
Hgb A1c MFr Bld: 5.6 % (ref 4.8–5.6)

## 2022-09-08 LAB — LIPID PANEL WITH LDL/HDL RATIO
Cholesterol, Total: 155 mg/dL (ref 100–199)
HDL: 62 mg/dL (ref >39)
LDL Chol Calc (NIH): 72 mg/dL (ref 0–99)
LDL/HDL Ratio: 1.2 ratio (ref 0.0–3.6)
Triglycerides: 121 mg/dL (ref 0–149)
VLDL Cholesterol Cal: 21 mg/dL (ref 5–40)

## 2022-09-10 ENCOUNTER — Telehealth: Payer: Self-pay | Admitting: Family Medicine

## 2022-09-10 NOTE — Telephone Encounter (Signed)
Copied from CRM [id number]. Topic: Referral - Question >> Sep 10, 2022  4:51 PM Marlow Baars wrote: Reason for CRM: The patient called in stating Delice Bison told him to call by Wednesday if he had not heard back from the podiatrist concerning his referral. He got caught up yesterday and called back today to let her know he still hasn't heard anything. Please assist patient further.

## 2022-09-14 ENCOUNTER — Telehealth: Payer: Self-pay | Admitting: Family Medicine

## 2022-09-14 NOTE — Telephone Encounter (Signed)
Copied from CRM [id number]. Topic: Referral - Status >> Sep 14, 2022  9:37 AM Macon Large wrote: Reason for CRM: Pt stated he has not heard from the podiatrist and requests that Delice Bison return his call at [phone]

## 2022-09-22 ENCOUNTER — Other Ambulatory Visit: Payer: Self-pay

## 2022-09-22 DIAGNOSIS — IMO0001 Reserved for inherently not codable concepts without codable children: Secondary | ICD-10-CM

## 2022-09-22 DIAGNOSIS — C61 Malignant neoplasm of prostate: Secondary | ICD-10-CM | POA: Diagnosis not present

## 2022-09-22 DIAGNOSIS — Z79818 Long term (current) use of other agents affecting estrogen receptors and estrogen levels: Secondary | ICD-10-CM | POA: Diagnosis not present

## 2022-09-23 ENCOUNTER — Encounter: Payer: Self-pay | Admitting: Podiatry

## 2022-09-23 ENCOUNTER — Ambulatory Visit (INDEPENDENT_AMBULATORY_CARE_PROVIDER_SITE_OTHER): Payer: Medicare Other | Admitting: Podiatry

## 2022-09-23 ENCOUNTER — Telehealth: Payer: Self-pay | Admitting: Family Medicine

## 2022-09-23 DIAGNOSIS — D2372 Other benign neoplasm of skin of left lower limb, including hip: Secondary | ICD-10-CM

## 2022-09-23 DIAGNOSIS — M7752 Other enthesopathy of left foot: Secondary | ICD-10-CM

## 2022-09-23 MED ORDER — DEXAMETHASONE SODIUM PHOSPHATE 120 MG/30ML IJ SOLN
2.0000 mg | Freq: Once | INTRAMUSCULAR | Status: AC
Start: 2022-09-23 — End: 2022-09-23
  Administered 2022-09-23: 2 mg via INTRA_ARTICULAR

## 2022-09-23 NOTE — Telephone Encounter (Signed)
Copied from CRM [id number]. Topic: Medicare AWV >> Sep 23, 2022 11:16 AM Payton Doughty wrote: Reason for CRM: Called patient to schedule Medicare Annual Wellness Visit (AWV). Left message for patient to call back and schedule Medicare Annual Wellness Visit (AWV).  Please schedule an appointment at any time with Sydell Axon, CMA  .  If any questions, please contact me.  Thank you ,  Verlee Rossetti; Care Guide Ambulatory Clinical Support [facility] Direct Dial: [phone]

## 2022-09-23 NOTE — Progress Notes (Signed)
Subjective:  Patient ID: Juan [name], male    DOB: March 06, 1951,  MRN: [id number] HPI Chief Complaint  Patient presents with   Foot Pain    Sub 5th MPJ left - callused area x 2 months, feels like walking on a pebble, tender   Diabetes    Last A1c was 5.6   New Patient (Initial Visit)    72 y.o. male presents with the above complaint.   ROS: Denies fever chills nausea vomit muscle aches pains calf pain back pain chest pain shortness of breath  Past Medical History:  Diagnosis Date   Arthritis 08/28/2016   Atrophic kidney 01/13/2013   Last Assessment & Plan:  Atrophic and nonfunctional LEFT kidney.  Discussed at length today. Asymptomatic. No intervention needed.  Overview:  Last Assessment & Plan:  Atrophic and nonfunctional LEFT kidney.  Discussed at length today. Asymptomatic. No intervention needed. Last Assessment & Plan:  Atrophic and nonfunctional LEFT kidney.  Discussed at length today. Asymptomatic. No intervention needed.   Cancer [facility])    COPD (chronic obstructive pulmonary disease) (HCC) 08/28/2016   Diabetes mellitus type 2, uncomplicated (HCC) 08/28/2016   diet controlled   ED (erectile dysfunction) 08/28/2016   GERD (gastroesophageal reflux disease)    Gross hematuria 07/22/2016   Overview:  Last Assessment & Plan:  The differential diagnosis for hematuria was reviewed.  Possible etiologies include, but are not limited to urolithiasis, infection, urothelial or renal malignancies, medical renal disease, and benign idiopathic hematuria.  The workup was reviewed and he will need a urinary cytology testing, a CT urogram, and Cystourethroscopy.  He has moved to [place], [place] and would like to have all this done with a local urologist since it is an hour and a half drive. Will send records there and have the work performed locally.   > 15 minutes were spent in face to face contact with the patient, >50% of which was spent counseling about about the diagnosis and plan.  Last  Assessment & Plan:  The differential diagnosis for hematuria was reviewed.  Possible etiologies include, but are not limited to urolithiasis, infection, urothelial or renal malignancies, medical renal disease, and benign idiopathic hematuria.  The workup was reviewed and he will need a urinary cytology testing,    H/O vagotomy 10/11/2012   Hyperlipemia    Hypertension 08/28/2016   (Pt denies, Lisinopril for kidney protection)   Kidney stones 08/28/2016   Last Assessment & Plan:  Can't see stone on KUB today again   Pt instructed to call or go to ER for acute flank pain since he only has one functioning kidney.   Currently no evidence of any obstructive uropathy.  Check chem 8 and call with results.   F/u one year for KUB   Knee contusion 10/20/2013   Peptic ulcer 08/28/2016   Peripheral neuropathy    Prostate cancer [facility])    Sarcoidosis 04/04/2015   Status post partial gastrectomy 10/11/2012   Thyroid nodule    Wears dentures    partial upper and lower   Wears hearing aid in both ears    Past Surgical History:  Procedure Laterality Date   APPENDECTOMY     CHOLECYSTECTOMY     COLONOSCOPY WITH PROPOFOL N/A 10/30/2019   Procedure: COLONOSCOPY WITH BIOPSY;  Surgeon: Midge Minium, MD;  Location: [facility];  Service: Endoscopy;  Laterality: N/A;  Diabetic - diet controlled priority 4   EYE SURGERY     bialteral cataract extraction   GASTRECTOMY  INSERTION PROSTATE RADIATION SEED  2002   Brachytherapy   nasal tumor     POLYPECTOMY N/A 10/30/2019   Procedure: POLYPECTOMY;  Surgeon: Midge Minium, MD;  Location: [facility];  Service: Endoscopy;  Laterality: N/A;    Current Outpatient Medications:    acetaminophen (TYLENOL) 500 MG tablet, Take 1,000 mg by mouth 2 (two) times daily as needed for pain., Disp: , Rfl:    Biotin 1000 MCG tablet, Take by mouth., Disp: , Rfl:    Cholecalciferol (GNP VITAMIN D MAXIMUM STRENGTH) 2000 units TABS, Take 1 tablet (2,000 Units total) by  mouth daily., Disp: 30 each, Rfl:    Cyanocobalamin (B-12) 500 MCG TABS, Take 1 tablet by mouth daily., Disp: 150 tablet, Rfl:    darolutamide (NUBEQA) 300 MG tablet, Take 600 mg by mouth 2 (two) times daily. berry, Disp: , Rfl:    dutasteride (AVODART) 0.5 MG capsule, Take 0.5 mg by mouth daily. Dr Allyson Sabal, Disp: , Rfl:    leuprolide (LUPRON) 22.5 MG injection, Inject into the muscle. Dr Allyson Sabal- cancer center, Disp: , Rfl:    lisinopril (ZESTRIL) 5 MG tablet, Take 1 tablet (5 mg total) by mouth daily., Disp: 90 tablet, Rfl: 1   Multiple Vitamin (MULTI-VITAMINS) TABS, Take by mouth., Disp: , Rfl:    pantoprazole (PROTONIX) 40 MG tablet, Take 1 tablet (40 mg total) by mouth 2 (two) times daily., Disp: 180 tablet, Rfl: 1  Allergies  Allergen Reactions   Nsaids Other (See Comments)    Hx of PUD   Statins Rash    Hyperglycemia   Review of Systems Objective:  There were no vitals filed for this visit.  General: Well developed, nourished, in no acute distress, alert and oriented x3   Dermatological: Skin is warm, dry and supple bilateral. Nails x 10 are well maintained; remaining integument appears unremarkable at this time. There are no open sores, no preulcerative lesions, no rash or signs of infection present.  Benign skin lesion subfifth metatarsal head of the left foot with underlying bursitis  Vascular: Dorsalis Pedis artery and Posterior Tibial artery pedal pulses are 2/4 bilateral with immedate capillary fill time. Pedal hair growth present. No varicosities and no lower extremity edema present bilateral.   Neruologic: Grossly intact via light touch bilateral. Vibratory intact via tuning fork bilateral. Protective threshold with Semmes Wienstein monofilament intact to all pedal sites bilateral. Patellar and Achilles deep tendon reflexes 2+ bilateral. No Babinski or clonus noted bilateral.   Musculoskeletal: No gross boney pedal deformities bilateral. No pain, crepitus, or limitation noted  with foot and ankle range of motion bilateral. Muscular strength 5/5 in all groups tested bilateral.  Gait: Unassisted, Nonantalgic.    Radiographs:  None taken  Assessment & Plan:   Assessment: Bursitis subfifth met head left benign skin lesion subfifth met head left  Plan: Discussed etiology pathology conservative surgical therapies at this point I injected dexamethasone local anesthetic 3 mg beneath the lesion into the bursa.  He tolerated procedure well.  Debrided benign skin lesion I discussed appropriate shoe gear with him we will follow-up with him on an as-needed basis.     Stephanie Littman T. Roberts, [place]

## 2022-09-29 ENCOUNTER — Other Ambulatory Visit: Payer: Self-pay

## 2022-09-29 DIAGNOSIS — C61 Malignant neoplasm of prostate: Secondary | ICD-10-CM

## 2022-10-06 ENCOUNTER — Ambulatory Visit
Admission: RE | Admit: 2022-10-06 | Discharge: 2022-10-06 | Disposition: A | Discharge status: Home / Self Care | Payer: Medicare Other | Source: Ambulatory Visit

## 2022-10-06 DIAGNOSIS — C61 Malignant neoplasm of prostate: Secondary | ICD-10-CM | POA: Insufficient documentation

## 2022-10-06 DIAGNOSIS — E119 Type 2 diabetes mellitus without complications: Secondary | ICD-10-CM | POA: Diagnosis not present

## 2022-10-06 DIAGNOSIS — M810 Age-related osteoporosis without current pathological fracture: Secondary | ICD-10-CM | POA: Diagnosis not present

## 2022-10-06 DIAGNOSIS — Z13820 Encounter for screening for osteoporosis: Secondary | ICD-10-CM | POA: Diagnosis not present

## 2022-10-06 DIAGNOSIS — E559 Vitamin D deficiency, unspecified: Secondary | ICD-10-CM | POA: Insufficient documentation

## 2022-10-06 DIAGNOSIS — Z79818 Long term (current) use of other agents affecting estrogen receptors and estrogen levels: Secondary | ICD-10-CM | POA: Diagnosis not present

## 2022-10-08 ENCOUNTER — Ambulatory Visit (INDEPENDENT_AMBULATORY_CARE_PROVIDER_SITE_OTHER): Payer: Medicare Other

## 2022-10-08 VITALS — BP 118/70 | Ht 68.0 in | Wt 200.8 lb

## 2022-10-08 DIAGNOSIS — Z0000 Encounter for general adult medical examination without abnormal findings: Secondary | ICD-10-CM | POA: Diagnosis not present

## 2022-10-08 NOTE — Patient Instructions (Signed)
Juan [name] , Thank you for taking time to come for your Medicare Wellness Visit. I appreciate your ongoing commitment to your health goals. Please review the following plan we discussed and let me know if I can assist you in the future.   These are the goals we discussed:  Goals      DIET - EAT MORE FRUITS AND VEGETABLES     DIET - INCREASE WATER INTAKE     Recommend to drink at least 6-8 8oz glasses of water per day.     Weight (lb) < 200 lb (90.7 kg)     Pt states he would like to lose weight over the next year with healthy eating and physical activity         This is a list of the screening recommended for you and due dates:  Health Maintenance  Topic Date Due   COVID-19 Vaccine (7 - 2023-24 season) 04/23/2022   Zoster (Shingles) Vaccine (2 of 2) 12/08/2022*   Yearly kidney function blood test for diabetes  06/06/2023*   Flu Shot  12/03/2022   Yearly kidney health urinalysis for diabetes  03/10/2023   Hemoglobin A1C  03/10/2023   Eye exam for diabetics  05/16/2023   Complete foot exam   09/07/2023   Medicare Annual Wellness Visit  10/08/2023   DTaP/Tdap/Td vaccine (2 - Td or Tdap) 10/16/2023   Colon Cancer Screening  10/29/2024   Pneumonia Vaccine  Completed   Hepatitis C Screening  Completed   HPV Vaccine  Aged Out  *Topic was postponed. The date shown is not the original due date.    Advanced directives: no  Conditions/risks identified: none  Next appointment: Follow up in one year for your annual wellness visit. 10/14/23 @ 9:45 am in person  Preventive Care 74 Years and Older, Male  Preventive care refers to lifestyle choices and visits with your health care provider that can promote health and wellness. What does preventive care include? A yearly physical exam. This is also called an annual well check. Dental exams once or twice a year. Routine eye exams. Ask your health care provider how often you should have your eyes checked. Personal lifestyle choices,  including: Daily care of your teeth and gums. Regular physical activity. Eating a healthy diet. Avoiding tobacco and drug use. Limiting alcohol use. Practicing safe sex. Taking low doses of aspirin every day. Taking vitamin and mineral supplements as recommended by your health care provider. What happens during an annual well check? The services and screenings done by your health care provider during your annual well check will depend on your age, overall health, lifestyle risk factors, and family history of disease. Counseling  Your health care provider may ask you questions about your: Alcohol use. Tobacco use. Drug use. Emotional well-being. Home and relationship well-being. Sexual activity. Eating habits. History of falls. Memory and ability to understand (cognition). Work and work Astronomer. Screening  You may have the following tests or measurements: Height, weight, and BMI. Blood pressure. Lipid and cholesterol levels. These may be checked every 5 years, or more frequently if you are over 74 years old. Skin check. Lung cancer screening. You may have this screening every year starting at age 74 if you have a 30-pack-year history of smoking and currently smoke or have quit within the past 15 years. Fecal occult blood test (FOBT) of the stool. You may have this test every year starting at age 74. Flexible sigmoidoscopy or colonoscopy. You may have a sigmoidoscopy  every 5 years or a colonoscopy every 10 years starting at age 15. Prostate cancer screening. Recommendations will vary depending on your family history and other risks. Hepatitis C blood test. Hepatitis B blood test. Sexually transmitted disease (STD) testing. Diabetes screening. This is done by checking your blood sugar (glucose) after you have not eaten for a while (fasting). You may have this done every 1-3 years. Abdominal aortic aneurysm (AAA) screening. You may need this if you are a current or former  smoker. Osteoporosis. You may be screened starting at age 31 if you are at high risk. Talk with your health care provider about your test results, treatment options, and if necessary, the need for more tests. Vaccines  Your health care provider may recommend certain vaccines, such as: Influenza vaccine. This is recommended every year. Tetanus, diphtheria, and acellular pertussis (Tdap, Td) vaccine. You may need a Td booster every 10 years. Zoster vaccine. You may need this after age 33. Pneumococcal 13-valent conjugate (PCV13) vaccine. One dose is recommended after age 27. Pneumococcal polysaccharide (PPSV23) vaccine. One dose is recommended after age 33. Talk to your health care provider about which screenings and vaccines you need and how often you need them. This information is not intended to replace advice given to you by your health care provider. Make sure you discuss any questions you have with your health care provider. Document Released: 05/17/2015 Document Revised: 01/08/2016 Document Reviewed: 02/19/2015 Elsevier Interactive Patient Education  2017 [employer].  Fall Prevention in the Home Falls can cause injuries. They can happen to people of all ages. There are many things you can do to make your home safe and to help prevent falls. What can I do on the outside of my home? Regularly fix the edges of walkways and driveways and fix any cracks. Remove anything that might make you trip as you walk through a door, such as a raised step or threshold. Trim any bushes or trees on the path to your home. Use bright outdoor lighting. Clear any walking paths of anything that might make someone trip, such as rocks or tools. Regularly check to see if handrails are loose or broken. Make sure that both sides of any steps have handrails. Any raised decks and porches should have guardrails on the edges. Have any leaves, snow, or ice cleared regularly. Use sand or salt on walking paths during  winter. Clean up any spills in your garage right away. This includes oil or grease spills. What can I do in the bathroom? Use night lights. Install grab bars by the toilet and in the tub and shower. Do not use towel bars as grab bars. Use non-skid mats or decals in the tub or shower. If you need to sit down in the shower, use a plastic, non-slip stool. Keep the floor dry. Clean up any water that spills on the floor as soon as it happens. Remove soap buildup in the tub or shower regularly. Attach bath mats securely with double-sided non-slip rug tape. Do not have throw rugs and other things on the floor that can make you trip. What can I do in the bedroom? Use night lights. Make sure that you have a light by your bed that is easy to reach. Do not use any sheets or blankets that are too big for your bed. They should not hang down onto the floor. Have a firm chair that has side arms. You can use this for support while you get dressed. Do not have  throw rugs and other things on the floor that can make you trip. What can I do in the kitchen? Clean up any spills right away. Avoid walking on wet floors. Keep items that you use a lot in easy-to-reach places. If you need to reach something above you, use a strong step stool that has a grab bar. Keep electrical cords out of the way. Do not use floor polish or wax that makes floors slippery. If you must use wax, use non-skid floor wax. Do not have throw rugs and other things on the floor that can make you trip. What can I do with my stairs? Do not leave any items on the stairs. Make sure that there are handrails on both sides of the stairs and use them. Fix handrails that are broken or loose. Make sure that handrails are as long as the stairways. Check any carpeting to make sure that it is firmly attached to the stairs. Fix any carpet that is loose or worn. Avoid having throw rugs at the top or bottom of the stairs. If you do have throw rugs,  attach them to the floor with carpet tape. Make sure that you have a light switch at the top of the stairs and the bottom of the stairs. If you do not have them, ask someone to add them for you. What else can I do to help prevent falls? Wear shoes that: Do not have high heels. Have rubber bottoms. Are comfortable and fit you well. Are closed at the toe. Do not wear sandals. If you use a stepladder: Make sure that it is fully opened. Do not climb a closed stepladder. Make sure that both sides of the stepladder are locked into place. Ask someone to hold it for you, if possible. Clearly mark and make sure that you can see: Any grab bars or handrails. First and last steps. Where the edge of each step is. Use tools that help you move around (mobility aids) if they are needed. These include: Canes. Walkers. Scooters. Crutches. Turn on the lights when you go into a dark area. Replace any light bulbs as soon as they burn out. Set up your furniture so you have a clear path. Avoid moving your furniture around. If any of your floors are uneven, fix them. If there are any pets around you, be aware of where they are. Review your medicines with your doctor. Some medicines can make you feel dizzy. This can increase your chance of falling. Ask your doctor what other things that you can do to help prevent falls. This information is not intended to replace advice given to you by your health care provider. Make sure you discuss any questions you have with your health care provider. Document Released: 02/14/2009 Document Revised: 09/26/2015 Document Reviewed: 05/25/2014 Elsevier Interactive Patient Education  2017 [employer].

## 2022-10-08 NOTE — Progress Notes (Signed)
Subjective:   Juan [name] is a 72 y.o. male who presents for Medicare Annual/Subsequent preventive examination.  Review of Systems     Cardiac Risk Factors include: advanced age (>1men, >21 women);hypertension;male gender;diabetes mellitus     Objective:    Today's Vitals   10/08/22 1119  BP: 118/70  Weight: 200 lb 12.8 oz (91.1 kg)  Height: 5\' 8"  (1.727 m)   Body mass index is 30.53 kg/m.     10/08/2022   11:27 AM 10/06/2021    1:39 PM 10/02/2020    1:38 PM 10/30/2019    7:14 AM 09/27/2019    1:43 PM 09/21/2018    1:43 PM 04/28/2018    3:41 PM  Advanced Directives  Does Patient Have a Medical Advance Directive? No No No No No No No  Would patient like information on creating a medical advance directive? No - Patient declined No - Patient declined No - Patient declined No - Patient declined No - Patient declined Yes (MAU/Ambulatory/Procedural Areas - Information given)     Current Medications (verified) Outpatient Encounter Medications as of 10/08/2022  Medication Sig   acetaminophen (TYLENOL) 500 MG tablet Take 1,000 mg by mouth 2 (two) times daily as needed for pain.   Biotin 1000 MCG tablet Take by mouth.   Cholecalciferol (GNP VITAMIN D MAXIMUM STRENGTH) 2000 units TABS Take 1 tablet (2,000 Units total) by mouth daily.   Cyanocobalamin (B-12) 500 MCG TABS Take 1 tablet by mouth daily.   darolutamide (NUBEQA) 300 MG tablet Take 600 mg by mouth 2 (two) times daily. berry   dutasteride (AVODART) 0.5 MG capsule Take 0.5 mg by mouth daily. Dr Allyson Sabal   leuprolide (LUPRON) 22.5 MG injection Inject into the muscle. Dr Allyson Sabal- cancer center   lisinopril (ZESTRIL) 5 MG tablet Take 1 tablet (5 mg total) by mouth daily.   Multiple Vitamin (MULTI-VITAMINS) TABS Take by mouth.   pantoprazole (PROTONIX) 40 MG tablet Take 1 tablet (40 mg total) by mouth 2 (two) times daily.   No facility-administered encounter medications on file as of 10/08/2022.    Allergies (verified) Nsaids and  Statins   History: Past Medical History:  Diagnosis Date   Arthritis 08/28/2016   Atrophic kidney 01/13/2013   Last Assessment & Plan:  Atrophic and nonfunctional LEFT kidney.  Discussed at length today. Asymptomatic. No intervention needed.  Overview:  Last Assessment & Plan:  Atrophic and nonfunctional LEFT kidney.  Discussed at length today. Asymptomatic. No intervention needed. Last Assessment & Plan:  Atrophic and nonfunctional LEFT kidney.  Discussed at length today. Asymptomatic. No intervention needed.   Cancer [facility])    COPD (chronic obstructive pulmonary disease) (HCC) 08/28/2016   Diabetes mellitus type 2, uncomplicated (HCC) 08/28/2016   diet controlled   ED (erectile dysfunction) 08/28/2016   GERD (gastroesophageal reflux disease)    Gross hematuria 07/22/2016   Overview:  Last Assessment & Plan:  The differential diagnosis for hematuria was reviewed.  Possible etiologies include, but are not limited to urolithiasis, infection, urothelial or renal malignancies, medical renal disease, and benign idiopathic hematuria.  The workup was reviewed and he will need a urinary cytology testing, a CT urogram, and Cystourethroscopy.  He has moved to [place], [place] and would like to have all this done with a local urologist since it is an hour and a half drive. Will send records there and have the work performed locally.   > 15 minutes were spent in face to face contact with the patient, >50%  of which was spent counseling about about the diagnosis and plan.  Last Assessment & Plan:  The differential diagnosis for hematuria was reviewed.  Possible etiologies include, but are not limited to urolithiasis, infection, urothelial or renal malignancies, medical renal disease, and benign idiopathic hematuria.  The workup was reviewed and he will need a urinary cytology testing,    H/O vagotomy 10/11/2012   Hyperlipemia    Hypertension 08/28/2016   (Pt denies, Lisinopril for kidney protection)   Kidney stones  08/28/2016   Last Assessment & Plan:  Can't see stone on KUB today again   Pt instructed to call or go to ER for acute flank pain since he only has one functioning kidney.   Currently no evidence of any obstructive uropathy.  Check chem 8 and call with results.   F/u one year for KUB   Knee contusion 10/20/2013   Peptic ulcer 08/28/2016   Peripheral neuropathy    Prostate cancer [facility])    Sarcoidosis 04/04/2015   Status post partial gastrectomy 10/11/2012   Thyroid nodule    Wears dentures    partial upper and lower   Wears hearing aid in both ears    Past Surgical History:  Procedure Laterality Date   APPENDECTOMY     CHOLECYSTECTOMY     COLONOSCOPY WITH PROPOFOL N/A 10/30/2019   Procedure: COLONOSCOPY WITH BIOPSY;  Surgeon: Midge Minium, MD;  Location: [facility];  Service: Endoscopy;  Laterality: N/A;  Diabetic - diet controlled priority 4   EYE SURGERY     bialteral cataract extraction   GASTRECTOMY     INSERTION PROSTATE RADIATION SEED  2002   Brachytherapy   nasal tumor     POLYPECTOMY N/A 10/30/2019   Procedure: POLYPECTOMY;  Surgeon: Midge Minium, MD;  Location: [facility];  Service: Endoscopy;  Laterality: N/A;   Family History  Problem Relation Age of Onset   Cancer Mother        lung   Diabetes Father    Congestive Heart Failure Father    Cancer Brother    Prostate cancer Neg Hx    Kidney cancer Neg Hx    Social History   Socioeconomic History   Marital status: Divorced    Spouse name: Not on file   Number of children: 2   Years of education: Not on file   Highest education level: Bachelor's degree (e.g., BA, AB, BS)  Occupational History    Employer: [employer]  Tobacco Use   Smoking status: Former    Packs/day: 2.00    Years: 35.00    Additional pack years: 0.00    Total pack years: 70.00    Types: Cigarettes    Quit date: 05/04/1996    Years since quitting: 26.4   Smokeless tobacco: Never   Tobacco comments:    smoking cessation  materials not required  Vaping Use   Vaping Use: Never used  Substance and Sexual Activity   Alcohol use: Yes    Comment: Occasional beer   Drug use: No   Sexual activity: Not Currently    Birth control/protection: None  Other Topics Concern   Not on file  Social History Narrative   Pt's sister lives with him   Social Determinants of Health   Financial Resource Strain: Low Risk  (10/08/2022)   Overall Financial Resource Strain (CARDIA)    Difficulty of Paying Living Expenses: Not hard at all  Food Insecurity: No Food Insecurity (10/08/2022)   Hunger Vital Sign  Worried About Programme researcher, broadcasting/film/video in the Last Year: Never true    Ran Out of Food in the Last Year: Never true  Transportation Needs: No Transportation Needs (10/08/2022)   PRAPARE - Administrator, Civil Service (Medical): No    Lack of Transportation (Non-Medical): No  Physical Activity: Sufficiently Active (10/08/2022)   Exercise Vital Sign    Days of Exercise per Week: 3 days    Minutes of Exercise per Session: 50 min  Recent Concern: Physical Activity - Insufficiently Active (09/03/2022)   Exercise Vital Sign    Days of Exercise per Week: 3 days    Minutes of Exercise per Session: 40 min  Stress: No Stress Concern Present (10/08/2022)   [employer] of Occupational Health - Occupational Stress Questionnaire    Feeling of Stress : Not at all  Social Connections: Socially Isolated (10/08/2022)   Social Connection and Isolation Panel [NHANES]    Frequency of Communication with Friends and Family: Twice a week    Frequency of Social Gatherings with Friends and Family: More than three times a week    Attends Religious Services: Never    Database administrator or Organizations: No    Attends Engineer, structural: Never    Marital Status: Divorced    Tobacco Counseling Counseling given: Not Answered Tobacco comments: smoking cessation materials not required   Clinical Intake:  Pre-visit  preparation completed: Yes  Pain : No/denies pain     Nutritional Risks: None Diabetes: Yes CBG done?: No Did pt. bring in CBG monitor from home?: No  How often do you need to have someone help you when you read instructions, pamphlets, or other written materials from your doctor or pharmacy?: 1 - Never  Diabetic?yes Nutrition Risk Assessment:  Has the patient had any N/V/D within the last 2 months?  Yes  Does the patient have any non-healing wounds?  No  Has the patient had any unintentional weight loss or weight gain?  No   Diabetes:  Is the patient diabetic?  Yes  If diabetic, was a CBG obtained today?  No  Did the patient bring in their glucometer from home?  No  How often do you monitor your CBG's? never.   Financial Strains and Diabetes Management:  Are you having any financial strains with the device, your supplies or your medication? No .  Does the patient want to be seen by Chronic Care Management for management of their diabetes?  No  Would the patient like to be referred to a Nutritionist or for Diabetic Management?  No   Diabetic Exams:  Diabetic Eye Exam: Completed 05/15/22. Pt has been advised about the importance in completing this exam.   Diabetic Foot Exam: Completed 09/07/22. Pt has been advised about the importance in completing this exam.    Interpreter Needed?: No  Information entered by :: Kennedy Bucker, LPN   Activities of Daily Living    10/08/2022   11:28 AM 10/04/2022   11:45 AM  In your present state of health, do you have any difficulty performing the following activities:  Hearing? 1 1  Vision? 0 0  Difficulty concentrating or making decisions? 0 0  Walking or climbing stairs? 0 0  Dressing or bathing? 0 0  Doing errands, shopping? 0 0  Preparing Food and eating ? N N  Using the Toilet? N N  In the past six months, have you accidently leaked urine? N N  Do you have  problems with loss of bowel control? N N  Managing your Medications? N  N  Managing your Finances? N N  Housekeeping or managing your Housekeeping? N N    Patient Care Team: Duanne Limerick, MD as PCP - General (Family Medicine) Demetrios Loll, MD as Consulting Physician (Internal Medicine) Sherlon Handing, MD as Consulting Physician (Internal Medicine)  Indicate any recent Medical Services you may have received from other than [facility] providers in the past year (date may be approximate).     Assessment:   This is a routine wellness examination for Chrissie Noa.  Hearing/Vision screen Hearing Screening - Comments:: Wears aids Vision Screening - Comments:: No glasses- Dr.King  Dietary issues and exercise activities discussed: Current Exercise Habits: Home exercise routine, Type of exercise: walking, Time (Minutes): 50, Frequency (Times/Week): 4, Weekly Exercise (Minutes/Week): 200, Intensity: Mild   Goals Addressed             This Visit's Progress    DIET - EAT MORE FRUITS AND VEGETABLES         Depression Screen    10/08/2022   11:25 AM 09/07/2022    1:23 PM 03/09/2022   10:46 AM 10/06/2021    1:38 PM 09/09/2021    8:57 AM 09/03/2021    9:24 AM 03/06/2021    8:58 AM  PHQ 2/9 Scores  PHQ - 2 Score 0 0 0 0 0 0 0  PHQ- 9 Score 0 0 0 0 0 2 0    Fall Risk    10/08/2022   11:27 AM 10/04/2022   11:45 AM 09/07/2022    1:23 PM 03/09/2022   10:46 AM 03/09/2022   10:45 AM  Fall Risk   Falls in the past year? 0 0 0 1 0  Number falls in past yr: 0  0 0 0  Injury with Fall? 0 0 0 0 0  Risk for fall due to : No Fall Risks  No Fall Risks No Fall Risks No Fall Risks  Follow up Falls prevention discussed;Falls evaluation completed  Falls evaluation completed Falls evaluation completed Falls evaluation completed    FALL RISK PREVENTION PERTAINING TO THE HOME:  Any stairs in or around the home? Yes  If so, are there any without handrails? No  Home free of loose throw rugs in walkways, pet beds, electrical cords, etc? Yes  Adequate lighting in your home to  reduce risk of falls? Yes   ASSISTIVE DEVICES UTILIZED TO PREVENT FALLS:  Life alert? No  Use of a cane, walker or w/c? No  Grab bars in the bathroom? Yes  Shower chair or bench in shower? Yes  Elevated toilet seat or a handicapped toilet? No   TIMED UP AND GO:  Was the test performed? Yes .  Length of time to ambulate 10 feet: 4 sec.   Gait steady and fast without use of assistive device  Cognitive Function:        10/08/2022   11:32 AM 09/21/2018    1:49 PM 09/20/2017    1:56 PM  6CIT Screen  What Year? 0 points 0 points 0 points  What month? 0 points 0 points 0 points  What time? 0 points 0 points 0 points  Count back from 20 0 points 0 points 0 points  Months in reverse 0 points 0 points 0 points  Repeat phrase 2 points 0 points 0 points  Total Score 2 points 0 points 0 points    Immunizations Immunization History  Administered  Date(s) Administered   Fluad Quad(high Dose 65+) 02/19/2020, 02/26/2022   Influenza, High Dose Seasonal PF 01/23/2016, 03/01/2017, 03/29/2018, 03/22/2019, 03/22/2019, 02/28/2021   Influenza-Unspecified 03/01/2017, 03/22/2019   Moderna Covid-19 Vaccine Bivalent Booster 30yrs & up 02/28/2021   Moderna Sars-Covid-2 Vaccination 03/07/2020   PFIZER Comirnaty(Gray Top)Covid-19 Tri-Sucrose Vaccine 10/30/2020, 02/26/2022   PFIZER(Purple Top)SARS-COV-2 Vaccination 08/09/2019, 08/30/2019   Pneumococcal Conjugate-13 03/01/2017   Pneumococcal Polysaccharide-23 03/21/2009, 04/01/2015   Tdap 10/15/2013   Tetanus 11/09/2006   Zoster Recombinat (Shingrix) 03/03/2022   Zoster, Live 05/04/2008    TDAP status: Up to date  Flu Vaccine status: Up to date  Pneumococcal vaccine status: Up to date  Covid-19 vaccine status: Completed vaccines  Qualifies for Shingles Vaccine? Yes   Zostavax completed Yes   Shingrix Completed?: No.    Education has been provided regarding the importance of this vaccine. Patient has been advised to call insurance company to  determine out of pocket expense if they have not yet received this vaccine. Advised may also receive vaccine at local pharmacy or Health Dept. Verbalized acceptance and understanding.  Screening Tests Health Maintenance  Topic Date Due   COVID-19 Vaccine (7 - 2023-24 season) 04/23/2022   Zoster Vaccines- Shingrix (2 of 2) 12/08/2022 (Originally 04/28/2022)   Diabetic kidney evaluation - eGFR measurement  06/06/2023 (Originally 10/05/2020)   INFLUENZA VACCINE  12/03/2022   Diabetic kidney evaluation - Urine ACR  03/10/2023   HEMOGLOBIN A1C  03/10/2023   OPHTHALMOLOGY EXAM  05/16/2023   FOOT EXAM  09/07/2023   Medicare Annual Wellness (AWV)  10/08/2023   DTaP/Tdap/Td (2 - Td or Tdap) 10/16/2023   Colonoscopy  10/29/2024   Pneumonia Vaccine 72+ Years old  Completed   Hepatitis C Screening  Completed   HPV VACCINES  Aged Out  Had 1st Shingrix  Health Maintenance  Health Maintenance Due  Topic Date Due   COVID-19 Vaccine (7 - 2023-24 season) 04/23/2022    Colorectal cancer screening: Type of screening: Colonoscopy. Completed 10/30/19. Repeat every 5 years  Lung Cancer Screening: (Low Dose CT Chest recommended if Age 23-80 years, 30 pack-year currently smoking OR have quit w/in 15years.) does not qualify.    Additional Screening:  Hepatitis C Screening: does qualify; Completed 06/30/17  Vision Screening: Recommended annual ophthalmology exams for early detection of glaucoma and other disorders of the eye. Is the patient up to date with their annual eye exam?  Yes  Who is the provider or what is the name of the office in which the patient attends annual eye exams? Dr.King If pt is not established with a provider, would they like to be referred to a provider to establish care? No .   Dental Screening: Recommended annual dental exams for proper oral hygiene  Community Resource Referral / Chronic Care Management: CRR required this visit?  No   CCM required this visit?  No       Plan:     I have personally reviewed and noted the following in the patient's chart:   Medical and social history Use of alcohol, tobacco or illicit drugs  Current medications and supplements including opioid prescriptions. Patient is not currently taking opioid prescriptions. Functional ability and status Nutritional status Physical activity Advanced directives List of other physicians Hospitalizations, surgeries, and ER visits in previous 12 months Vitals Screenings to include cognitive, depression, and falls Referrals and appointments  In addition, I have reviewed and discussed with patient certain preventive protocols, quality metrics, and best practice recommendations. A written personalized  care plan for preventive services as well as general preventive health recommendations were provided to patient.     Hal Hope, LPN   4/0/9811   Nurse Notes: none

## 2022-10-13 ENCOUNTER — Ambulatory Visit
Admission: RE | Admit: 2022-10-13 | Discharge: 2022-10-13 | Disposition: A | Discharge status: Home / Self Care | Payer: Medicare Other | Source: Ambulatory Visit

## 2022-10-13 DIAGNOSIS — I2510 Atherosclerotic heart disease of native coronary artery without angina pectoris: Secondary | ICD-10-CM | POA: Diagnosis not present

## 2022-10-13 DIAGNOSIS — N200 Calculus of kidney: Secondary | ICD-10-CM | POA: Diagnosis not present

## 2022-10-13 DIAGNOSIS — C61 Malignant neoplasm of prostate: Secondary | ICD-10-CM | POA: Insufficient documentation

## 2022-10-13 DIAGNOSIS — I700 Atherosclerosis of aorta: Secondary | ICD-10-CM | POA: Diagnosis not present

## 2022-10-13 DIAGNOSIS — D869 Sarcoidosis, unspecified: Secondary | ICD-10-CM | POA: Insufficient documentation

## 2022-10-13 DIAGNOSIS — N261 Atrophy of kidney (terminal): Secondary | ICD-10-CM | POA: Diagnosis not present

## 2022-10-13 DIAGNOSIS — Z9889 Other specified postprocedural states: Secondary | ICD-10-CM | POA: Diagnosis not present

## 2022-10-13 DIAGNOSIS — Z923 Personal history of irradiation: Secondary | ICD-10-CM | POA: Diagnosis not present

## 2022-10-13 MED ORDER — PIFLIFOLASTAT F 18 (PYLARIFY) INJECTION
9.0000 | Freq: Once | INTRAVENOUS | Status: AC
  Administered 2022-10-13: 9.72 via INTRAVENOUS

## 2022-11-02 DIAGNOSIS — M818 Other osteoporosis without current pathological fracture: Secondary | ICD-10-CM | POA: Diagnosis not present

## 2022-11-02 DIAGNOSIS — C61 Malignant neoplasm of prostate: Secondary | ICD-10-CM | POA: Diagnosis not present

## 2022-12-23 DIAGNOSIS — T387X5A Adverse effect of androgens and anabolic congeners, initial encounter: Secondary | ICD-10-CM | POA: Diagnosis not present

## 2022-12-23 DIAGNOSIS — M818 Other osteoporosis without current pathological fracture: Secondary | ICD-10-CM | POA: Diagnosis not present

## 2022-12-23 DIAGNOSIS — Z87891 Personal history of nicotine dependence: Secondary | ICD-10-CM | POA: Diagnosis not present

## 2022-12-23 DIAGNOSIS — Z5111 Encounter for antineoplastic chemotherapy: Secondary | ICD-10-CM | POA: Diagnosis not present

## 2022-12-23 DIAGNOSIS — Z5112 Encounter for antineoplastic immunotherapy: Secondary | ICD-10-CM | POA: Diagnosis not present

## 2022-12-23 DIAGNOSIS — C61 Malignant neoplasm of prostate: Secondary | ICD-10-CM | POA: Diagnosis not present

## 2022-12-28 ENCOUNTER — Ambulatory Visit
Admission: EM | Admit: 2022-12-28 | Discharge: 2022-12-28 | Disposition: A | Discharge status: Home / Self Care | Payer: Medicare Other | Attending: Internal Medicine | Admitting: Internal Medicine

## 2022-12-28 DIAGNOSIS — J029 Acute pharyngitis, unspecified: Secondary | ICD-10-CM

## 2022-12-28 DIAGNOSIS — U071 COVID-19: Secondary | ICD-10-CM

## 2022-12-28 LAB — RESP PANEL BY RT-PCR (FLU A&B, COVID) ARPGX2
Influenza A by PCR: NEGATIVE
Influenza B by PCR: NEGATIVE
SARS Coronavirus 2 by RT PCR: POSITIVE — AB

## 2022-12-28 LAB — GROUP A STREP BY PCR: Group A Strep by PCR: NOT DETECTED

## 2022-12-28 MED ORDER — MOLNUPIRAVIR EUA 200MG CAPSULE
4.0000 | ORAL_CAPSULE | Freq: Two times a day (BID) | ORAL | 0 refills | Status: AC
Start: 2022-12-28 — End: 2023-01-02

## 2022-12-28 NOTE — ED Triage Notes (Addendum)
Patient presents with sinus drainage, sore throat, coughing up mucus x day 3.  Treated with Claritin.

## 2022-12-28 NOTE — ED Provider Notes (Signed)
[facility]    CSN: [id number] Arrival date & time: 12/28/22  0801      History   Chief Complaint Chief Complaint  Patient presents with   Cough    HPI Juan [name] is a 72 y.o. male  presents for evaluation of URI symptoms for 3 days. Patient reports associated symptoms of sore throat, cough, congestion, chills, diarrhea. Denies N/V, fevers, ear pain, body aches, shortness of breath. Patient does not have a hx of asthma.  He is a previous smoker and has a history of COPD.  He denies any COPD exacerbations or hospitalizations in the past year.  He does not use any inhalers.  No known sick contacts.  Pt has taken Claritin OTC for symptoms. Pt has no other concerns at this time.    Cough Associated symptoms: chills and sore throat     Past Medical History:  Diagnosis Date   Arthritis 08/28/2016   Atrophic kidney 01/13/2013   Last Assessment & Plan:  Atrophic and nonfunctional LEFT kidney.  Discussed at length today. Asymptomatic. No intervention needed.  Overview:  Last Assessment & Plan:  Atrophic and nonfunctional LEFT kidney.  Discussed at length today. Asymptomatic. No intervention needed. Last Assessment & Plan:  Atrophic and nonfunctional LEFT kidney.  Discussed at length today. Asymptomatic. No intervention needed.   Cancer [facility])    COPD (chronic obstructive pulmonary disease) (HCC) 08/28/2016   Diabetes mellitus type 2, uncomplicated (HCC) 08/28/2016   diet controlled   ED (erectile dysfunction) 08/28/2016   GERD (gastroesophageal reflux disease)    Gross hematuria 07/22/2016   Overview:  Last Assessment & Plan:  The differential diagnosis for hematuria was reviewed.  Possible etiologies include, but are not limited to urolithiasis, infection, urothelial or renal malignancies, medical renal disease, and benign idiopathic hematuria.  The workup was reviewed and he will need a urinary cytology testing, a CT urogram, and Cystourethroscopy.  He has moved to  [place], [place] and would like to have all this done with a local urologist since it is an hour and a half drive. Will send records there and have the work performed locally.   > 15 minutes were spent in face to face contact with the patient, >50% of which was spent counseling about about the diagnosis and plan.  Last Assessment & Plan:  The differential diagnosis for hematuria was reviewed.  Possible etiologies include, but are not limited to urolithiasis, infection, urothelial or renal malignancies, medical renal disease, and benign idiopathic hematuria.  The workup was reviewed and he will need a urinary cytology testing,    H/O vagotomy 10/11/2012   Hyperlipemia    Hypertension 08/28/2016   (Pt denies, Lisinopril for kidney protection)   Kidney stones 08/28/2016   Last Assessment & Plan:  Can't see stone on KUB today again   Pt instructed to call or go to ER for acute flank pain since he only has one functioning kidney.   Currently no evidence of any obstructive uropathy.  Check chem 8 and call with results.   F/u one year for KUB   Knee contusion 10/20/2013   Peptic ulcer 08/28/2016   Peripheral neuropathy    Prostate cancer [facility])    Sarcoidosis 04/04/2015   Status post partial gastrectomy 10/11/2012   Thyroid nodule    Wears dentures    partial upper and lower   Wears hearing aid in both ears     Patient Active Problem List   Diagnosis Date Noted   Osteoarthritis  of fingers of hands, bilateral 09/09/2021   Aortic atherosclerosis (HCC) 09/18/2020   Encounter for screening colonoscopy    Polyp of transverse colon    Multinodular goiter 08/29/2018   History of sarcoidosis 06/29/2018   Thyroid nodule 05/24/2018   Acetonemia due to secondary diabetes (HCC) 09/29/2017   Gastric hypersecretion 09/29/2017   Microalbuminuria 03/31/2017   B12 deficiency 03/01/2017   Prostate cancer (HCC) 08/28/2016   Obesity (BMI 30-39.9) 08/28/2016   Kidney stones, calcium oxalate 08/28/2016   ED (erectile  dysfunction) 08/28/2016   History of duodenal ulcer 08/28/2016   Type 2 diabetes, controlled, with neuropathy (HCC) 08/28/2016   History of cholelithiasis 08/28/2016   Arthritis 08/28/2016   Gastroesophageal reflux disease 08/28/2016   Hyperlipidemia, unspecified 04/04/2015   Hyperlipidemia 04/04/2015   Vitamin D deficiency, unspecified 07/12/2013   Vitamin D deficiency 07/12/2013   Atrophic kidney 01/13/2013   Status post partial gastrectomy 10/11/2012   H/O vagotomy 10/11/2012    Past Surgical History:  Procedure Laterality Date   APPENDECTOMY     CHOLECYSTECTOMY     COLONOSCOPY WITH PROPOFOL N/A 10/30/2019   Procedure: COLONOSCOPY WITH BIOPSY;  Surgeon: Midge Minium, MD;  Location: [facility];  Service: Endoscopy;  Laterality: N/A;  Diabetic - diet controlled priority 4   EYE SURGERY     bialteral cataract extraction   GASTRECTOMY     INSERTION PROSTATE RADIATION SEED  2002   Brachytherapy   nasal tumor     POLYPECTOMY N/A 10/30/2019   Procedure: POLYPECTOMY;  Surgeon: Midge Minium, MD;  Location: [facility];  Service: Endoscopy;  Laterality: N/A;       Home Medications    Prior to Admission medications   Medication Sig Start Date End Date Taking? Authorizing Provider  molnupiravir EUA (LAGEVRIO) 200 mg CAPS capsule Take 4 capsules (800 mg total) by mouth 2 (two) times daily for 5 days. 12/28/22 01/02/23 Yes Radford Pax, NP  acetaminophen (TYLENOL) 500 MG tablet Take 1,000 mg by mouth 2 (two) times daily as needed for pain.    [provider]  Biotin 1000 MCG tablet Take by mouth.    [provider]  Cholecalciferol (GNP VITAMIN D MAXIMUM STRENGTH) 2000 units TABS Take 1 tablet (2,000 Units total) by mouth daily. 03/02/17   Plonk, Chrissie Noa, MD  Cyanocobalamin (B-12) 500 MCG TABS Take 1 tablet by mouth daily. 03/02/17   Plonk, Chrissie Noa, MD  darolutamide (NUBEQA) 300 MG tablet Take 600 mg by mouth 2 (two) times daily. berry    [provider]  dutasteride (AVODART) 0.5 MG capsule Take 0.5 mg by mouth daily. Dr Allyson Sabal    [provider]  leuprolide (LUPRON) 22.5 MG injection Inject into the muscle. Dr Allyson Sabal- cancer center    [provider]  lisinopril (ZESTRIL) 5 MG tablet Take 1 tablet (5 mg total) by mouth daily. 09/07/22   Duanne Limerick, MD  Multiple Vitamin (MULTI-VITAMINS) TABS Take by mouth.    [provider]  pantoprazole (PROTONIX) 40 MG tablet Take 1 tablet (40 mg total) by mouth 2 (two) times daily. 09/07/22   Duanne Limerick, MD    Family History Family History  Problem Relation Age of Onset   Cancer Mother        lung   Diabetes Father    Congestive Heart Failure Father    Cancer Brother    Prostate cancer Neg Hx    Kidney cancer Neg Hx     Social History  Social History   Tobacco Use   Smoking status: Former    Current packs/day: 0.00    Average packs/day: 2.0 packs/day for 35.0 years (70.0 ttl pk-yrs)    Types: Cigarettes    Start date: 05/04/1961    Quit date: 05/04/1996    Years since quitting: 26.6   Smokeless tobacco: Never   Tobacco comments:    smoking cessation materials not required  Vaping Use   Vaping status: Never Used  Substance Use Topics   Alcohol use: Yes    Comment: Occasional beer   Drug use: No     Allergies   Nsaids and Statins   Review of Systems Review of Systems  Constitutional:  Positive for chills.  HENT:  Positive for congestion and sore throat.   Respiratory:  Positive for cough.   Gastrointestinal:  Positive for diarrhea.     Physical Exam Triage Vital Signs ED Triage Vitals  Encounter Vitals Group     BP 12/28/22 0811 (!) 146/82     Systolic BP Percentile --      Diastolic BP Percentile --      Pulse Rate 12/28/22 0811 67     Resp 12/28/22 0811 18     Temp 12/28/22 0811 99.1 F (37.3 C)     Temp Source 12/28/22 0811 Oral     SpO2 12/28/22 0811 95 %     Weight 12/28/22 0810 200 lb (90.7 kg)     Height 12/28/22  0810 5\' 7"  (1.702 m)     Head Circumference --      Peak Flow --      Pain Score 12/28/22 0810 0     Pain Loc --      Pain Education --      Exclude from Growth Chart --    No data found.  Updated Vital Signs BP (!) 146/82 (BP Location: Left Arm)   Pulse 67   Temp 99.1 F (37.3 C) (Oral)   Resp 18   Ht 5\' 7"  (1.702 m)   Wt 200 lb (90.7 kg)   SpO2 95%   BMI 31.32 kg/m   Visual Acuity Right Eye Distance:   Left Eye Distance:   Bilateral Distance:    Right Eye Near:   Left Eye Near:    Bilateral Near:     Physical Exam Vitals and nursing note reviewed.  Constitutional:      General: He is not in acute distress.    Appearance: Normal appearance. He is not ill-appearing or toxic-appearing.  HENT:     Head: Normocephalic and atraumatic.     Right Ear: Tympanic membrane and ear canal normal.     Left Ear: Tympanic membrane and ear canal normal.     Nose: Congestion present.     Mouth/Throat:     Mouth: Mucous membranes are moist.     Pharynx: Posterior oropharyngeal erythema present.  Eyes:     Pupils: Pupils are equal, round, and reactive to light.  Cardiovascular:     Rate and Rhythm: Normal rate and regular rhythm.     Heart sounds: Normal heart sounds.  Pulmonary:     Effort: Pulmonary effort is normal.     Breath sounds: Normal breath sounds. No wheezing or rhonchi.  Musculoskeletal:     Cervical back: Normal range of motion and neck supple.  Lymphadenopathy:     Cervical: No cervical adenopathy.  Skin:    General: Skin is warm and dry.  Neurological:  General: No focal deficit present.     Mental Status: He is alert and oriented to person, place, and time.  Psychiatric:        Mood and Affect: Mood normal.        Behavior: Behavior normal.      UC Treatments / Results  Labs (all labs ordered are listed, but only abnormal results are displayed) Labs Reviewed  RESP PANEL BY RT-PCR (FLU A&B, COVID) ARPGX2 - Abnormal; Notable for the following  components:      Result Value   SARS Coronavirus 2 by RT PCR POSITIVE (*)    All other components within normal limits  GROUP A STREP BY PCR    EKG   Radiology No results found.  Procedures Procedures (including critical care time)  Medications Ordered in UC Medications - No data to display  Initial Impression / Assessment and Plan / UC Course  I have reviewed the triage vital signs and the nursing notes.  Pertinent labs & imaging results that were available during my care of the patient were reviewed by me and considered in my medical decision making (see chart for details).     Reviewed exam and symptoms with patient.  No red flags.  Negative flu and strep, positive COVID PCR.  Given age patient does qualify for antiviral treatment.  Given his oncology medications Paxlovid is contraindicated.  Recent labs reviewed.  Will do molnupiravir.  He declined cough Rx and will use over-the-counter cough drops or cough medicine as needed.  Discussed fluids and rest.  PCP follow-up 2 days for recheck.  Strict ER precautions reviewed and patient verbalized understanding. Final Clinical Impressions(s) / UC Diagnoses   Final diagnoses:  Sore throat  COVID-19     Discharge Instructions      Start molnupiravir twice daily for 5 days.  Stay hydrated and you may use over-the-counter Tylenol as needed for fevers.  Use over-the-counter cough drops or cough medicine as needed.  Lots of rest.  Please follow-up with your PCP in 2 days for recheck.  Please go to the ER for any worsening symptoms.  I hope you feel better soon!     ED Prescriptions     Medication Sig Dispense Auth. Provider   molnupiravir EUA (LAGEVRIO) 200 mg CAPS capsule Take 4 capsules (800 mg total) by mouth 2 (two) times daily for 5 days. 40 capsule Radford Pax, NP      PDMP not reviewed this encounter.   Radford Pax, NP 12/28/22 [phone]

## 2022-12-28 NOTE — Discharge Instructions (Signed)
Start molnupiravir twice daily for 5 days.  Stay hydrated and you may use over-the-counter Tylenol as needed for fevers.  Use over-the-counter cough drops or cough medicine as needed.  Lots of rest.  Please follow-up with your PCP in 2 days for recheck.  Please go to the ER for any worsening symptoms.  I hope you feel better soon!

## 2023-01-18 DIAGNOSIS — C61 Malignant neoplasm of prostate: Secondary | ICD-10-CM | POA: Diagnosis not present

## 2023-02-01 DIAGNOSIS — C61 Malignant neoplasm of prostate: Secondary | ICD-10-CM | POA: Diagnosis not present

## 2023-02-05 ENCOUNTER — Other Ambulatory Visit: Payer: Self-pay | Admitting: Family Medicine

## 2023-02-05 DIAGNOSIS — I10 Essential (primary) hypertension: Secondary | ICD-10-CM

## 2023-02-05 DIAGNOSIS — K219 Gastro-esophageal reflux disease without esophagitis: Secondary | ICD-10-CM

## 2023-02-05 NOTE — Telephone Encounter (Signed)
Requested medication (s) are due for refill today: Yes  Requested medication (s) are on the active medication list: Yes  Last refill:  09/07/22  Future visit scheduled: Yes  Notes to clinic:  Unable to refill per protocol due to failed labs, no updated results.      Requested Prescriptions  Pending Prescriptions Disp Refills   lisinopril (ZESTRIL) 5 MG tablet [Pharmacy Med Name: LISINOPRIL 5 MG TABLET] 90 tablet 1    Sig: TAKE 1 TABLET (5 MG TOTAL) BY MOUTH DAILY.     Cardiovascular:  ACE Inhibitors Failed - 02/05/2023  2:31 PM      Failed - Cr in normal range and within 180 days    Creatinine, Ser  Date Value Ref Range Status  10/06/2019 1.18 0.76 - 1.27 mg/dL Final         Failed - K in normal range and within 180 days    Potassium  Date Value Ref Range Status  10/06/2019 4.5 3.5 - 5.2 mmol/L Final         Failed - Last BP in normal range    BP Readings from Last 1 Encounters:  12/28/22 (!) 146/82         Passed - Patient is not pregnant      Passed - Valid encounter within last 6 months    Recent Outpatient Visits           5 months ago Gastroesophageal reflux disease, unspecified whether esophagitis present   [facility], [facility]   11 months ago Essential hypertension   [place] [facility], [facility]   1 year ago Osteoarthritis of fingers of hands, bilateral   [place] [facility], Ocie Bob, MD   1 year ago Essential hypertension   [place] [facility], MD   1 year ago Essential hypertension   [place] [facility], MD       Future Appointments             In 1 month Duanne Limerick, MD [facility] at [facility], [facility]            Signed  Prescriptions Disp Refills   pantoprazole (PROTONIX) 40 MG tablet 180 tablet 1    Sig: TAKE 1 TABLET BY MOUTH TWICE A DAY     Gastroenterology: Proton Pump Inhibitors Passed - 02/05/2023  2:31 PM      Passed - Valid encounter within last 12 months    Recent Outpatient Visits           5 months ago Gastroesophageal reflux disease, unspecified whether esophagitis present   [facility], [facility]   11 months ago Essential hypertension   [place] [facility], MD   1 year ago Osteoarthritis of fingers of hands, bilateral   [place] [facility], Ocie Bob, MD   1 year ago Essential hypertension   [place] [facility], MD   1 year ago Essential hypertension   [place] [facility]  at [facility], MD       Future Appointments             In 1 month Duanne Limerick, MD [facility] at [facility], [facility]

## 2023-02-05 NOTE — Telephone Encounter (Signed)
Requested Prescriptions  Pending Prescriptions Disp Refills   pantoprazole (PROTONIX) 40 MG tablet [Pharmacy Med Name: PANTOPRAZOLE SOD DR 40 MG TAB] 180 tablet 1    Sig: TAKE 1 TABLET BY MOUTH TWICE A DAY     Gastroenterology: Proton Pump Inhibitors Passed - 02/05/2023  2:31 PM      Passed - Valid encounter within last 12 months    Recent Outpatient Visits           5 months ago Gastroesophageal reflux disease, unspecified whether esophagitis present   [facility], [facility]   11 months ago Essential hypertension   [place] [facility], MD   1 year ago Osteoarthritis of fingers of hands, bilateral   [place] [facility], Ocie Bob, MD   1 year ago Essential hypertension   [place] [facility], MD   1 year ago Essential hypertension   [place] [facility], MD       Future Appointments             In 1 month Duanne Limerick, MD [facility] at [facility], [facility]             lisinopril (ZESTRIL) 5 MG tablet [Pharmacy Med Name: LISINOPRIL 5 MG TABLET] 90 tablet 1    Sig: TAKE 1 TABLET (5 MG TOTAL) BY MOUTH DAILY.     Cardiovascular:  ACE Inhibitors Failed - 02/05/2023  2:31 PM      Failed - Cr in normal range and within 180 days    Creatinine, Ser  Date Value Ref Range Status  10/06/2019 1.18 0.76 - 1.27 mg/dL Final         Failed - K in normal range and within 180 days    Potassium  Date Value Ref Range Status  10/06/2019 4.5 3.5 - 5.2 mmol/L Final         Failed - Last BP in normal range    BP Readings from Last 1 Encounters:  12/28/22 (!) 146/82         Passed - Patient is not pregnant      Passed - Valid encounter within last 6 months    Recent  Outpatient Visits           5 months ago Gastroesophageal reflux disease, unspecified whether esophagitis present   [facility], MD   11 months ago Essential hypertension   [place] [facility], MD   1 year ago Osteoarthritis of fingers of hands, bilateral   [place] [facility], Ocie Bob, MD   1 year ago Essential hypertension   [place] [facility], MD   1 year ago Essential hypertension   [place] [facility], MD       Future Appointments             In 1 month Duanne Limerick, MD [facility] & [facility] Medicine at [facility], [facility]

## 2023-02-09 DIAGNOSIS — M79641 Pain in right hand: Secondary | ICD-10-CM | POA: Diagnosis not present

## 2023-02-09 DIAGNOSIS — S0093XA Contusion of unspecified part of head, initial encounter: Secondary | ICD-10-CM | POA: Diagnosis not present

## 2023-02-09 DIAGNOSIS — S199XXA Unspecified injury of neck, initial encounter: Secondary | ICD-10-CM | POA: Diagnosis not present

## 2023-02-09 DIAGNOSIS — N3001 Acute cystitis with hematuria: Secondary | ICD-10-CM | POA: Diagnosis not present

## 2023-02-09 DIAGNOSIS — S0511XA Contusion of eyeball and orbital tissues, right eye, initial encounter: Secondary | ICD-10-CM | POA: Diagnosis not present

## 2023-02-09 DIAGNOSIS — I10 Essential (primary) hypertension: Secondary | ICD-10-CM | POA: Diagnosis not present

## 2023-02-09 DIAGNOSIS — M25532 Pain in left wrist: Secondary | ICD-10-CM | POA: Diagnosis not present

## 2023-02-09 DIAGNOSIS — W19XXXA Unspecified fall, initial encounter: Secondary | ICD-10-CM | POA: Diagnosis not present

## 2023-02-09 DIAGNOSIS — E119 Type 2 diabetes mellitus without complications: Secondary | ICD-10-CM | POA: Diagnosis not present

## 2023-02-09 DIAGNOSIS — C61 Malignant neoplasm of prostate: Secondary | ICD-10-CM | POA: Diagnosis not present

## 2023-02-09 DIAGNOSIS — S59912A Unspecified injury of left forearm, initial encounter: Secondary | ICD-10-CM | POA: Diagnosis not present

## 2023-02-09 DIAGNOSIS — S61411A Laceration without foreign body of right hand, initial encounter: Secondary | ICD-10-CM | POA: Diagnosis not present

## 2023-02-09 DIAGNOSIS — R8271 Bacteriuria: Secondary | ICD-10-CM | POA: Diagnosis not present

## 2023-02-09 DIAGNOSIS — S0990XA Unspecified injury of head, initial encounter: Secondary | ICD-10-CM | POA: Diagnosis not present

## 2023-02-09 DIAGNOSIS — S61412A Laceration without foreign body of left hand, initial encounter: Secondary | ICD-10-CM | POA: Diagnosis not present

## 2023-02-09 DIAGNOSIS — Z888 Allergy status to other drugs, medicaments and biological substances status: Secondary | ICD-10-CM | POA: Diagnosis not present

## 2023-02-09 DIAGNOSIS — J449 Chronic obstructive pulmonary disease, unspecified: Secondary | ICD-10-CM | POA: Diagnosis not present

## 2023-02-09 DIAGNOSIS — E785 Hyperlipidemia, unspecified: Secondary | ICD-10-CM | POA: Diagnosis not present

## 2023-02-09 DIAGNOSIS — S2222XA Fracture of body of sternum, initial encounter for closed fracture: Secondary | ICD-10-CM | POA: Diagnosis not present

## 2023-02-09 DIAGNOSIS — Z87891 Personal history of nicotine dependence: Secondary | ICD-10-CM | POA: Diagnosis not present

## 2023-02-17 DIAGNOSIS — C61 Malignant neoplasm of prostate: Secondary | ICD-10-CM | POA: Diagnosis not present

## 2023-03-11 ENCOUNTER — Encounter: Payer: Self-pay | Admitting: Family Medicine

## 2023-03-11 ENCOUNTER — Ambulatory Visit (INDEPENDENT_AMBULATORY_CARE_PROVIDER_SITE_OTHER): Payer: Medicare Other | Admitting: Family Medicine

## 2023-03-11 VITALS — BP 118/60 | HR 69 | Ht 67.0 in | Wt 201.0 lb

## 2023-03-11 DIAGNOSIS — K219 Gastro-esophageal reflux disease without esophagitis: Secondary | ICD-10-CM

## 2023-03-11 DIAGNOSIS — D539 Nutritional anemia, unspecified: Secondary | ICD-10-CM | POA: Diagnosis not present

## 2023-03-11 DIAGNOSIS — Z8711 Personal history of peptic ulcer disease: Secondary | ICD-10-CM | POA: Diagnosis not present

## 2023-03-11 DIAGNOSIS — I10 Essential (primary) hypertension: Secondary | ICD-10-CM | POA: Diagnosis not present

## 2023-03-11 MED ORDER — PANTOPRAZOLE SODIUM 40 MG PO TBEC: 40.0000 mg | DELAYED_RELEASE_TABLET | Freq: Two times a day (BID) | ORAL | 1 refills | Status: DC

## 2023-03-11 MED ORDER — LISINOPRIL 5 MG PO TABS: 5.0000 mg | ORAL_TABLET | Freq: Every day | ORAL | 1 refills | Status: DC

## 2023-03-11 NOTE — Progress Notes (Signed)
Date:  03/11/2023   Name:  Juan [name]   DOB:  [birth year]   MRN:  [id number]   Chief Complaint: Hypertension and Gastroesophageal Reflux  Hypertension This is a chronic problem. The current episode started more than 1 year ago. The problem has been gradually improving since onset. The problem is controlled. Pertinent negatives include no anxiety, blurred vision, chest pain, headaches, malaise/fatigue, neck pain, orthopnea, palpitations, peripheral edema, PND, shortness of breath or sweats. (Chest wall pain only) There are no associated agents to hypertension. There are no known risk factors for coronary artery disease. Past treatments include ACE inhibitors. The current treatment provides moderate improvement. There are no compliance problems.  There is no history of angina, CAD/MI or heart failure. There is no history of chronic renal disease, a hypertension causing med or renovascular disease.  Gastroesophageal Reflux He reports no abdominal pain, no belching, no chest pain, no choking, no coughing, no dysphagia, no globus sensation, no heartburn, no nausea, no sore throat or no wheezing. The current episode started more than 1 year ago. The symptoms are aggravated by certain foods. Pertinent negatives include no fatigue. He has tried a PPI for the symptoms. The treatment provided moderate relief.    Lab Results  Component Value Date   NA 141 10/06/2019   K 4.5 10/06/2019   CO2 21 10/06/2019   GLUCOSE 90 10/06/2019   BUN 19 10/06/2019   CREATININE 1.18 10/06/2019   CALCIUM 9.5 10/06/2019   GFRNONAA 62 10/06/2019   Lab Results  Component Value Date   CHOL 155 09/07/2022   HDL 62 09/07/2022   LDLCALC 72 09/07/2022   TRIG 121 09/07/2022   CHOLHDL 2.7 10/06/2018   Lab Results  Component Value Date   TSH 1.467 03/01/2017   Lab Results  Component Value Date   HGBA1C 5.6 09/07/2022   Lab Results  Component Value Date   WBC 8.1 05/11/2018   HGB 13.3 05/11/2018   HCT 38.3  05/11/2018   MCV 91 05/11/2018   PLT 444 05/11/2018   Lab Results  Component Value Date   ALT 17 04/28/2018   AST 25 04/28/2018   ALKPHOS 78 04/28/2018   BILITOT 1.2 04/28/2018   Lab Results  Component Value Date   VD25OH 51.5 06/30/2017     Review of Systems  Constitutional:  Negative for diaphoresis, fatigue, fever, malaise/fatigue and unexpected weight change.  HENT:  Negative for sore throat.   Eyes:  Negative for blurred vision and visual disturbance.  Respiratory:  Negative for cough, choking, chest tightness, shortness of breath, wheezing and stridor.   Cardiovascular:  Negative for chest pain, palpitations, orthopnea, leg swelling and PND.  Gastrointestinal:  Negative for abdominal pain, blood in stool, constipation, dysphagia, heartburn and nausea.       No melana  Genitourinary:  Negative for difficulty urinating and hematuria.  Musculoskeletal:  Negative for neck pain.  Neurological:  Negative for headaches.    Patient Active Problem List   Diagnosis Date Noted   Osteoarthritis of fingers of hands, bilateral 09/09/2021   Aortic atherosclerosis (HCC) 09/18/2020   Encounter for screening colonoscopy    Polyp of transverse colon    Multinodular goiter 08/29/2018   History of sarcoidosis 06/29/2018   Thyroid nodule 05/24/2018   Acetonemia due to secondary diabetes (HCC) 09/29/2017   Gastric hypersecretion 09/29/2017   Microalbuminuria 03/31/2017   B12 deficiency 03/01/2017   Prostate cancer (HCC) 08/28/2016   Obesity (BMI 30-39.9) 08/28/2016   Kidney stones,  calcium oxalate 08/28/2016   ED (erectile dysfunction) 08/28/2016   History of duodenal ulcer 08/28/2016   Type 2 diabetes, controlled, with neuropathy (HCC) 08/28/2016   History of cholelithiasis 08/28/2016   Arthritis 08/28/2016   Gastroesophageal reflux disease 08/28/2016   Hyperlipidemia, unspecified 04/04/2015   Hyperlipidemia 04/04/2015   Vitamin D deficiency, unspecified 07/12/2013   Vitamin D  deficiency 07/12/2013   Atrophic kidney 01/13/2013   Status post partial gastrectomy 10/11/2012   H/O vagotomy 10/11/2012    Allergies  Allergen Reactions   Nsaids Other (See Comments)    Hx of PUD   Statins Rash    Hyperglycemia    Past Surgical History:  Procedure Laterality Date   APPENDECTOMY     CHOLECYSTECTOMY     COLONOSCOPY WITH PROPOFOL N/A 10/30/2019   Procedure: COLONOSCOPY WITH BIOPSY;  Surgeon: Midge Minium, MD;  Location: [facility];  Service: Endoscopy;  Laterality: N/A;  Diabetic - diet controlled priority 4   EYE SURGERY     bialteral cataract extraction   GASTRECTOMY     INSERTION PROSTATE RADIATION SEED  2002   Brachytherapy   nasal tumor     POLYPECTOMY N/A 10/30/2019   Procedure: POLYPECTOMY;  Surgeon: Midge Minium, MD;  Location: [facility];  Service: Endoscopy;  Laterality: N/A;    Social History   Tobacco Use   Smoking status: Former    Current packs/day: 0.00    Average packs/day: 2.0 packs/day for 35.0 years (70.0 ttl pk-yrs)    Types: Cigarettes    Start date: 05/04/1961    Quit date: 05/04/1996    Years since quitting: 26.8   Smokeless tobacco: Never   Tobacco comments:    smoking cessation materials not required  Vaping Use   Vaping status: Never Used  Substance Use Topics   Alcohol use: Yes    Comment: Occasional beer   Drug use: No     Medication list has been reviewed and updated.  Current Meds  Medication Sig   acetaminophen (TYLENOL) 500 MG tablet Take 1,000 mg by mouth 2 (two) times daily as needed for pain.   Biotin 1000 MCG tablet Take by mouth.   Cholecalciferol (GNP VITAMIN D MAXIMUM STRENGTH) 2000 units TABS Take 1 tablet (2,000 Units total) by mouth daily.   Cyanocobalamin (B-12) 500 MCG TABS Take 1 tablet by mouth daily.   darolutamide (NUBEQA) 300 MG tablet Take 600 mg by mouth 2 (two) times daily. berry   dutasteride (AVODART) 0.5 MG capsule Take 0.5 mg by mouth daily. Dr Allyson Sabal   leuprolide (LUPRON)  22.5 MG injection Inject into the muscle. Dr Allyson Sabal- cancer center   lisinopril (ZESTRIL) 5 MG tablet Take 1 tablet (5 mg total) by mouth daily.   Multiple Vitamin (MULTI-VITAMINS) TABS Take by mouth.   pantoprazole (PROTONIX) 40 MG tablet TAKE 1 TABLET BY MOUTH TWICE A DAY       03/11/2023    1:22 PM 09/07/2022    1:23 PM 03/09/2022   10:46 AM 09/09/2021    8:58 AM  GAD 7 : Generalized Anxiety Score  Nervous, Anxious, on Edge 0 0 0 0  Control/stop worrying 0 0 0 0  Worry too much - different things 0 0 0 0  Trouble relaxing 0 0 0 0  Restless 0 0 0 0  Easily annoyed or irritable 0 0 0 0  Afraid - awful might happen 0 0 0 0  Total GAD 7 Score 0 0 0 0  Anxiety Difficulty  Not difficult at all Not difficult at all Not difficult at all        03/11/2023    1:22 PM 10/08/2022   11:25 AM 09/07/2022    1:23 PM  Depression screen PHQ 2/9  Decreased Interest 0 0 0  Down, Depressed, Hopeless 0 0 0  PHQ - 2 Score 0 0 0  Altered sleeping 0 0 0  Tired, decreased energy 0 0 0  Change in appetite 0 0 0  Feeling bad or failure about yourself  0 0 0  Trouble concentrating 0 0 0  Moving slowly or fidgety/restless 0 0 0  Suicidal thoughts 0 0 0  PHQ-9 Score 0 0 0  Difficult doing work/chores Not difficult at all Not difficult at all Not difficult at all    BP Readings from Last 3 Encounters:  03/11/23 118/60  12/28/22 (!) 146/82  10/08/22 118/70    Physical Exam Vitals and nursing note reviewed.  HENT:     Head: Normocephalic.     Right Ear: Tympanic membrane and external ear normal.     Left Ear: Tympanic membrane and external ear normal.     Nose: Nose normal.     Mouth/Throat:     Mouth: Mucous membranes are moist.     Tongue: No lesions.     Palate: No mass.     Comments: Tongue no pallor Eyes:     General: No scleral icterus.       Right eye: No discharge.        Left eye: No discharge.     Conjunctiva/sclera: Conjunctivae normal.     Pupils: Pupils are equal, round, and  reactive to light.  Neck:     Thyroid: No thyromegaly.     Vascular: No JVD.     Trachea: No tracheal deviation.  Cardiovascular:     Rate and Rhythm: Normal rate and regular rhythm.     Heart sounds: Normal heart sounds. No murmur heard.    No friction rub. No gallop.  Pulmonary:     Effort: No respiratory distress.     Breath sounds: Normal breath sounds. No wheezing or rales.  Abdominal:     General: Bowel sounds are normal.     Palpations: Abdomen is soft. There is no mass.     Tenderness: There is no abdominal tenderness. There is no guarding or rebound.  Musculoskeletal:        General: No tenderness. Normal range of motion.     Cervical back: Normal range of motion and neck supple.  Lymphadenopathy:     Cervical: No cervical adenopathy.  Skin:    General: Skin is warm.     Capillary Refill: Capillary refill takes less than 2 seconds.     Findings: No rash.  Neurological:     Mental Status: He is alert and oriented to person, place, and time.     Cranial Nerves: No cranial nerve deficit.     Deep Tendon Reflexes: Reflexes are normal and symmetric.     Wt Readings from Last 3 Encounters:  03/11/23 201 lb (91.2 kg)  12/28/22 200 lb (90.7 kg)  10/08/22 200 lb 12.8 oz (91.1 kg)    BP 118/60   Pulse 69   Ht 5\' 7"  (1.702 m)   Wt 201 lb (91.2 kg)   SpO2 96%   BMI 31.48 kg/m   Assessment and Plan: 1. Essential hypertension Chronic.  Controlled.  Stable.  Asymptomatic.  Tolerating medications well.  Current blood pressure is 118/60 without symptomatology.  Continue lisinopril 5 mg once a day.  Will check renal function panel for electrolytes and GFR. - lisinopril (ZESTRIL) 5 MG tablet; Take 1 tablet (5 mg total) by mouth daily.  Dispense: 90 tablet; Refill: 1 - Renal Function Panel  2. Gastroesophageal reflux disease, unspecified whether esophagitis present Chronic.  Controlled.  Stable.  Continue pantoprazole 40 mg 1 twice a day.  Patient's had no episodes of melena  nor lower GI bleed.  Will check CBC given his history of bleeding peptic ulcer. - pantoprazole (PROTONIX) 40 MG tablet; Take 1 tablet (40 mg total) by mouth 2 (two) times daily.  Dispense: 180 tablet; Refill: 1 - CBC with Differential/Platelet  3. History of bleeding ulcers Patient back in 2012 2013 had bleeding ulcer of a duodenal nature.  Patient has a pale complexion but mucous membranes are pink and red.  Will check CBC for surveillance of blood loss. - CBC with Differential/Platelet     Elizabeth Sauer, MD

## 2023-03-12 DIAGNOSIS — C61 Malignant neoplasm of prostate: Secondary | ICD-10-CM | POA: Diagnosis not present

## 2023-03-12 DIAGNOSIS — C801 Malignant (primary) neoplasm, unspecified: Secondary | ICD-10-CM | POA: Diagnosis not present

## 2023-03-12 DIAGNOSIS — N132 Hydronephrosis with renal and ureteral calculous obstruction: Secondary | ICD-10-CM | POA: Diagnosis not present

## 2023-03-12 LAB — CBC WITH DIFFERENTIAL/PLATELET
Basophils Absolute: 0 10*3/uL (ref 0.0–0.2)
Basos: 0 %
EOS (ABSOLUTE): 0.1 10*3/uL (ref 0.0–0.4)
Eos: 1 %
Hematocrit: 38.8 % (ref 37.5–51.0)
Hemoglobin: 12.9 g/dL — ABNORMAL LOW (ref 13.0–17.7)
Immature Grans (Abs): 0 10*3/uL (ref 0.0–0.1)
Immature Granulocytes: 0 %
Lymphocytes Absolute: 0.9 10*3/uL (ref 0.7–3.1)
Lymphs: 14 %
MCH: 32.7 pg (ref 26.6–33.0)
MCHC: 33.2 g/dL (ref 31.5–35.7)
MCV: 99 fL — ABNORMAL HIGH (ref 79–97)
Monocytes Absolute: 0.8 10*3/uL (ref 0.1–0.9)
Monocytes: 12 %
Neutrophils Absolute: 4.7 10*3/uL (ref 1.4–7.0)
Neutrophils: 73 %
Platelets: 229 10*3/uL (ref 150–450)
RBC: 3.94 x10E6/uL — ABNORMAL LOW (ref 4.14–5.80)
RDW: 12.8 % (ref 11.6–15.4)
WBC: 6.6 10*3/uL (ref 3.4–10.8)

## 2023-03-12 LAB — RENAL FUNCTION PANEL
Albumin: 4 g/dL (ref 3.8–4.8)
BUN/Creatinine Ratio: 17 (ref 10–24)
BUN: 17 mg/dL (ref 8–27)
CO2: 21 mmol/L (ref 20–29)
Calcium: 9.2 mg/dL (ref 8.6–10.2)
Chloride: 103 mmol/L (ref 96–106)
Creatinine, Ser: 0.98 mg/dL (ref 0.76–1.27)
Glucose: 99 mg/dL (ref 70–99)
Phosphorus: 3.5 mg/dL (ref 2.8–4.1)
Potassium: 4.7 mmol/L (ref 3.5–5.2)
Sodium: 139 mmol/L (ref 134–144)
eGFR: 81 mL/min/{1.73_m2} (ref >59)

## 2023-03-15 DIAGNOSIS — M810 Age-related osteoporosis without current pathological fracture: Secondary | ICD-10-CM | POA: Diagnosis not present

## 2023-03-15 DIAGNOSIS — C61 Malignant neoplasm of prostate: Secondary | ICD-10-CM | POA: Diagnosis not present

## 2023-03-15 DIAGNOSIS — Z5111 Encounter for antineoplastic chemotherapy: Secondary | ICD-10-CM | POA: Diagnosis not present

## 2023-03-15 DIAGNOSIS — Z87891 Personal history of nicotine dependence: Secondary | ICD-10-CM | POA: Diagnosis not present

## 2023-03-17 ENCOUNTER — Encounter: Payer: Self-pay | Admitting: Family Medicine

## 2023-03-17 LAB — VITAMIN B12: Vitamin B-12: >2000 pg/mL — ABNORMAL HIGH (ref 232–1245)

## 2023-03-17 LAB — SPECIMEN STATUS REPORT

## 2023-03-17 LAB — FOLATE: Folate: 13.4 ng/mL (ref >3.0)

## 2023-03-20 DIAGNOSIS — Z23 Encounter for immunization: Secondary | ICD-10-CM | POA: Diagnosis not present

## 2023-04-05 DIAGNOSIS — C61 Malignant neoplasm of prostate: Secondary | ICD-10-CM | POA: Diagnosis not present

## 2023-04-05 DIAGNOSIS — Z5111 Encounter for antineoplastic chemotherapy: Secondary | ICD-10-CM | POA: Diagnosis not present

## 2023-04-27 DIAGNOSIS — Z5111 Encounter for antineoplastic chemotherapy: Secondary | ICD-10-CM | POA: Diagnosis not present

## 2023-04-27 DIAGNOSIS — C61 Malignant neoplasm of prostate: Secondary | ICD-10-CM | POA: Diagnosis not present

## 2023-05-14 DIAGNOSIS — Z961 Presence of intraocular lens: Secondary | ICD-10-CM | POA: Diagnosis not present

## 2023-05-14 DIAGNOSIS — H35342 Macular cyst, hole, or pseudohole, left eye: Secondary | ICD-10-CM | POA: Diagnosis not present

## 2023-05-14 DIAGNOSIS — H26491 Other secondary cataract, right eye: Secondary | ICD-10-CM | POA: Diagnosis not present

## 2023-05-14 DIAGNOSIS — E119 Type 2 diabetes mellitus without complications: Secondary | ICD-10-CM | POA: Diagnosis not present

## 2023-05-14 DIAGNOSIS — H43811 Vitreous degeneration, right eye: Secondary | ICD-10-CM | POA: Diagnosis not present

## 2023-05-14 LAB — HM DIABETES EYE EXAM

## 2023-05-17 DIAGNOSIS — Z5111 Encounter for antineoplastic chemotherapy: Secondary | ICD-10-CM | POA: Diagnosis not present

## 2023-05-17 DIAGNOSIS — C61 Malignant neoplasm of prostate: Secondary | ICD-10-CM | POA: Diagnosis not present

## 2023-05-17 DIAGNOSIS — M810 Age-related osteoporosis without current pathological fracture: Secondary | ICD-10-CM | POA: Diagnosis not present

## 2023-05-17 DIAGNOSIS — Z7952 Long term (current) use of systemic steroids: Secondary | ICD-10-CM | POA: Diagnosis not present

## 2023-05-17 DIAGNOSIS — Z87891 Personal history of nicotine dependence: Secondary | ICD-10-CM | POA: Diagnosis not present

## 2023-05-20 DIAGNOSIS — E042 Nontoxic multinodular goiter: Secondary | ICD-10-CM | POA: Diagnosis not present

## 2023-05-28 DIAGNOSIS — E042 Nontoxic multinodular goiter: Secondary | ICD-10-CM | POA: Diagnosis not present

## 2023-06-07 DIAGNOSIS — C61 Malignant neoplasm of prostate: Secondary | ICD-10-CM | POA: Diagnosis not present

## 2023-06-07 DIAGNOSIS — Z7952 Long term (current) use of systemic steroids: Secondary | ICD-10-CM | POA: Diagnosis not present

## 2023-06-07 DIAGNOSIS — M810 Age-related osteoporosis without current pathological fracture: Secondary | ICD-10-CM | POA: Diagnosis not present

## 2023-06-07 DIAGNOSIS — F17290 Nicotine dependence, other tobacco product, uncomplicated: Secondary | ICD-10-CM | POA: Diagnosis not present

## 2023-06-07 DIAGNOSIS — Z5111 Encounter for antineoplastic chemotherapy: Secondary | ICD-10-CM | POA: Diagnosis not present

## 2023-06-07 DIAGNOSIS — G629 Polyneuropathy, unspecified: Secondary | ICD-10-CM | POA: Diagnosis not present

## 2023-06-28 DIAGNOSIS — Z87891 Personal history of nicotine dependence: Secondary | ICD-10-CM | POA: Diagnosis not present

## 2023-06-28 DIAGNOSIS — Z5111 Encounter for antineoplastic chemotherapy: Secondary | ICD-10-CM | POA: Diagnosis not present

## 2023-06-28 DIAGNOSIS — C61 Malignant neoplasm of prostate: Secondary | ICD-10-CM | POA: Diagnosis not present

## 2023-06-28 DIAGNOSIS — G629 Polyneuropathy, unspecified: Secondary | ICD-10-CM | POA: Diagnosis not present

## 2023-06-28 DIAGNOSIS — Z7952 Long term (current) use of systemic steroids: Secondary | ICD-10-CM | POA: Diagnosis not present

## 2023-07-19 DIAGNOSIS — Z5111 Encounter for antineoplastic chemotherapy: Secondary | ICD-10-CM | POA: Diagnosis not present

## 2023-07-19 DIAGNOSIS — M8580 Other specified disorders of bone density and structure, unspecified site: Secondary | ICD-10-CM | POA: Diagnosis not present

## 2023-07-19 DIAGNOSIS — C61 Malignant neoplasm of prostate: Secondary | ICD-10-CM | POA: Diagnosis not present

## 2023-07-19 DIAGNOSIS — Z87891 Personal history of nicotine dependence: Secondary | ICD-10-CM | POA: Diagnosis not present

## 2023-07-19 DIAGNOSIS — R600 Localized edema: Secondary | ICD-10-CM | POA: Diagnosis not present

## 2023-07-20 DIAGNOSIS — I82411 Acute embolism and thrombosis of right femoral vein: Secondary | ICD-10-CM | POA: Diagnosis not present

## 2023-07-20 DIAGNOSIS — R600 Localized edema: Secondary | ICD-10-CM | POA: Diagnosis not present

## 2023-07-20 DIAGNOSIS — I82431 Acute embolism and thrombosis of right popliteal vein: Secondary | ICD-10-CM | POA: Diagnosis not present

## 2023-09-09 ENCOUNTER — Encounter: Payer: Self-pay | Admitting: Family Medicine

## 2023-09-09 ENCOUNTER — Ambulatory Visit (INDEPENDENT_AMBULATORY_CARE_PROVIDER_SITE_OTHER): Payer: Self-pay | Admitting: Family Medicine

## 2023-09-09 VITALS — BP 120/66 | HR 68 | Ht 67.0 in | Wt 195.0 lb

## 2023-09-09 DIAGNOSIS — I10 Essential (primary) hypertension: Secondary | ICD-10-CM | POA: Diagnosis not present

## 2023-09-09 DIAGNOSIS — K219 Gastro-esophageal reflux disease without esophagitis: Secondary | ICD-10-CM

## 2023-09-09 MED ORDER — LISINOPRIL 5 MG PO TABS
5.0000 mg | ORAL_TABLET | Freq: Every day | ORAL | 1 refills | Status: AC
Start: 2023-09-09 — End: ?

## 2023-09-09 MED ORDER — PANTOPRAZOLE SODIUM 40 MG PO TBEC: 40.0000 mg | DELAYED_RELEASE_TABLET | Freq: Two times a day (BID) | ORAL | 1 refills | Status: AC

## 2023-09-09 NOTE — Progress Notes (Signed)
 Date:  09/09/2023   Name:  Juan [name]   DOB:  08/03/1950   MRN:  [id number]   Chief Complaint: Medical Management of Chronic Issues (Patient presents today for medication refills for his diabetes and HTN. )  Hypertension This is a chronic problem. The current episode started more than 1 year ago. The problem has been gradually improving since onset. The problem is controlled. Pertinent negatives include no anxiety, blurred vision, chest pain, headaches, malaise/fatigue, neck pain, orthopnea, palpitations, peripheral edema, PND, shortness of breath or sweats. There are no associated agents to hypertension. Risk factors for coronary artery disease include dyslipidemia. Past treatments include ACE inhibitors. The current treatment provides moderate improvement. There are no compliance problems.  There is no history of CAD/MI or CVA. There is no history of chronic renal disease, a hypertension causing med or renovascular disease.  Gastroesophageal Reflux He reports no abdominal pain, no belching, no chest pain, no dysphagia, no globus sensation, no heartburn, no hoarse voice, no nausea, no sore throat or no stridor. This is a chronic problem. The current episode started more than 1 year ago. The problem has been gradually improving. The symptoms are aggravated by certain foods. Pertinent negatives include no anemia, muscle weakness or orthopnea. He has tried a PPI for the symptoms. The treatment provided moderate relief.    Lab Results  Component Value Date   NA 139 03/11/2023   K 4.7 03/11/2023   CO2 21 03/11/2023   GLUCOSE 99 03/11/2023   BUN 17 03/11/2023   CREATININE 0.98 03/11/2023   CALCIUM 9.2 03/11/2023   EGFR 81 03/11/2023   GFRNONAA 62 10/06/2019   Lab Results  Component Value Date   CHOL 155 09/07/2022   HDL 62 09/07/2022   LDLCALC 72 09/07/2022   TRIG 121 09/07/2022   CHOLHDL 2.7 10/06/2018   Lab Results  Component Value Date   TSH 1.467 03/01/2017   Lab Results   Component Value Date   HGBA1C 5.6 09/07/2022   Lab Results  Component Value Date   WBC 6.6 03/11/2023   HGB 12.9 (L) 03/11/2023   HCT 38.8 03/11/2023   MCV 99 (H) 03/11/2023   PLT 229 03/11/2023   Lab Results  Component Value Date   ALT 17 04/28/2018   AST 25 04/28/2018   ALKPHOS 78 04/28/2018   BILITOT 1.2 04/28/2018   Lab Results  Component Value Date   VD25OH 51.5 06/30/2017     Review of Systems  Constitutional:  Negative for malaise/fatigue.  HENT:  Negative for hoarse voice and sore throat.   Eyes:  Negative for blurred vision.  Respiratory:  Negative for shortness of breath.   Cardiovascular:  Negative for chest pain, palpitations, orthopnea and PND.  Gastrointestinal:  Negative for abdominal pain, blood in stool, constipation, dysphagia, heartburn and nausea.  Genitourinary:  Negative for difficulty urinating and penile discharge.  Musculoskeletal:  Negative for muscle weakness and neck pain.  Neurological:  Negative for headaches.    Patient Active Problem List   Diagnosis Date Noted   Osteoarthritis of fingers of hands, bilateral 09/09/2021   Aortic atherosclerosis (HCC) 09/18/2020   Encounter for screening colonoscopy    Polyp of transverse colon    Multinodular goiter 08/29/2018   History of sarcoidosis 06/29/2018   Thyroid  nodule 05/24/2018   Acetonemia due to secondary diabetes (HCC) 09/29/2017   Gastric hypersecretion 09/29/2017   Microalbuminuria 03/31/2017   B12 deficiency 03/01/2017   Prostate cancer (HCC) 08/28/2016   Obesity (BMI  30-39.9) 08/28/2016   Kidney stones, calcium oxalate 08/28/2016   ED (erectile dysfunction) 08/28/2016   History of duodenal ulcer 08/28/2016   Type 2 diabetes, controlled, with neuropathy (HCC) 08/28/2016   History of cholelithiasis 08/28/2016   Arthritis 08/28/2016   Gastroesophageal reflux disease 08/28/2016   Hyperlipidemia, unspecified 04/04/2015   Hyperlipidemia 04/04/2015   Vitamin D  deficiency,  unspecified 07/12/2013   Vitamin D  deficiency 07/12/2013   Atrophic kidney 01/13/2013   Status post partial gastrectomy 10/11/2012   H/O vagotomy 10/11/2012    Allergies  Allergen Reactions   Nsaids Other (See Comments)    Hx of PUD   Statins Rash    Hyperglycemia    Past Surgical History:  Procedure Laterality Date   APPENDECTOMY     CHOLECYSTECTOMY     COLONOSCOPY WITH PROPOFOL  N/A 10/30/2019   Procedure: COLONOSCOPY WITH BIOPSY;  Surgeon: Marnee Sink, MD;  Location: [facility];  Service: Endoscopy;  Laterality: N/A;  Diabetic - diet controlled priority 4   EYE SURGERY     bialteral cataract extraction   GASTRECTOMY     INSERTION PROSTATE RADIATION SEED  2002   Brachytherapy   nasal tumor     POLYPECTOMY N/A 10/30/2019   Procedure: POLYPECTOMY;  Surgeon: Marnee Sink, MD;  Location: [facility];  Service: Endoscopy;  Laterality: N/A;    Social History   Tobacco Use   Smoking status: Former    Current packs/day: 0.00    Average packs/day: 2.0 packs/day for 35.0 years (70.0 ttl pk-yrs)    Types: Cigarettes    Start date: 05/04/1961    Quit date: 05/04/1996    Years since quitting: 27.3   Smokeless tobacco: Never   Tobacco comments:    smoking cessation materials not required  Vaping Use   Vaping status: Never Used  Substance Use Topics   Alcohol use: Yes    Comment: Occasional beer   Drug use: No     Medication list has been reviewed and updated.  Current Meds  Medication Sig   acetaminophen (TYLENOL) 500 MG tablet Take 1,000 mg by mouth 2 (two) times daily as needed for pain.   apixaban (ELIQUIS) 5 MG TABS tablet Take 5 mg by mouth 2 (two) times daily.   Biotin 1000 MCG tablet Take by mouth.   Cholecalciferol  (GNP VITAMIN D  MAXIMUM STRENGTH) 2000 units TABS Take 1 tablet (2,000 Units total) by mouth daily.   Cyanocobalamin  (B-12) 500 MCG TABS Take 1 tablet by mouth daily.   darolutamide (NUBEQA) 300 MG tablet Take 600 mg by mouth 2 (two)  times daily. berry   dutasteride (AVODART) 0.5 MG capsule Take 0.5 mg by mouth daily. Dr Katheryne Pane   leuprolide  (LUPRON ) 22.5 MG injection Inject into the muscle. Dr Katheryne Pane- cancer center   lisinopril  (ZESTRIL ) 5 MG tablet Take 1 tablet (5 mg total) by mouth daily.   Multiple Vitamin (MULTI-VITAMINS) TABS Take by mouth.   pantoprazole  (PROTONIX ) 40 MG tablet Take 1 tablet (40 mg total) by mouth 2 (two) times daily.       09/09/2023    1:21 PM 03/11/2023    1:22 PM 09/07/2022    1:23 PM 03/09/2022   10:46 AM  GAD 7 : Generalized Anxiety Score  Nervous, Anxious, on Edge 0 0 0 0  Control/stop worrying 0 0 0 0  Worry too much - different things 0 0 0 0  Trouble relaxing 0 0 0 0  Restless 0 0 0 0  Easily annoyed or  irritable 0 0 0 0  Afraid - awful might happen 0 0 0 0  Total GAD 7 Score 0 0 0 0  Anxiety Difficulty Not difficult at all Not difficult at all Not difficult at all Not difficult at all       09/09/2023    1:21 PM 03/11/2023    1:22 PM 10/08/2022   11:25 AM  Depression screen PHQ 2/9  Decreased Interest 0 0 0  Down, Depressed, Hopeless 0 0 0  PHQ - 2 Score 0 0 0  Altered sleeping 0 0 0  Tired, decreased energy 3 0 0  Change in appetite 0 0 0  Feeling bad or failure about yourself  0 0 0  Trouble concentrating 0 0 0  Moving slowly or fidgety/restless 3 0 0  Suicidal thoughts 0 0 0  PHQ-9 Score 6 0 0  Difficult doing work/chores Not difficult at all Not difficult at all Not difficult at all    BP Readings from Last 3 Encounters:  09/09/23 120/66  03/11/23 118/60  12/28/22 (!) 146/82    Physical Exam Vitals and nursing note reviewed.  Constitutional:      Appearance: He is well-developed.  HENT:     Head: Normocephalic and atraumatic.     Right Ear: Tympanic membrane, ear canal and external ear normal.     Left Ear: Tympanic membrane, ear canal and external ear normal.     Nose: Nose normal. No congestion or rhinorrhea.     Mouth/Throat:     Mouth: Mucous membranes  are moist.     Dentition: Normal dentition.  Eyes:     General: Lids are normal. No scleral icterus.    Extraocular Movements: Extraocular movements intact.     Conjunctiva/sclera: Conjunctivae normal.     Pupils: Pupils are equal, round, and reactive to light.  Neck:     Thyroid : No thyromegaly.     Vascular: No carotid bruit, hepatojugular reflux or JVD.     Trachea: No tracheal deviation.  Cardiovascular:     Rate and Rhythm: Normal rate and regular rhythm.     Heart sounds: Normal heart sounds. No murmur heard.    No friction rub. No gallop.  Pulmonary:     Effort: Pulmonary effort is normal.     Breath sounds: Normal breath sounds. No wheezing, rhonchi or rales.  Abdominal:     General: Bowel sounds are normal.     Palpations: Abdomen is soft. There is no hepatomegaly, splenomegaly or mass.     Tenderness: There is no abdominal tenderness. There is no guarding or rebound.     Hernia: There is no hernia in the left inguinal area.  Musculoskeletal:        General: Normal range of motion.     Cervical back: Normal range of motion and neck supple.  Lymphadenopathy:     Cervical: No cervical adenopathy.  Skin:    General: Skin is warm and dry.     Findings: No rash.  Neurological:     Mental Status: He is alert and oriented to person, place, and time.     Sensory: No sensory deficit.     Deep Tendon Reflexes: Reflexes are normal and symmetric.  Psychiatric:        Mood and Affect: Mood is not anxious or depressed.     Wt Readings from Last 3 Encounters:  09/09/23 195 lb (88.5 kg)  03/11/23 201 lb (91.2 kg)  12/28/22 200 lb (90.7 kg)  BP 120/66   Pulse 68   Ht 5\' 7"  (1.702 m)   Wt 195 lb (88.5 kg)   SpO2 96%   BMI 30.54 kg/m   Assessment and Plan:  1. Essential hypertension (Primary) Chronic.  Controlled.  Stable.  Blood pressure is 120/66.  Asymptomatic.  Tolerating medications well.  Will continue lisinopril  5 mg once a day.  And will recheck patient in 6  months.  Review of basic metabolic panel from hematology-oncology is acceptable with normal range of electrolytes except slight elevation of potassium and normal GFR creatinine. - lisinopril  (ZESTRIL ) 5 MG tablet; Take 1 tablet (5 mg total) by mouth daily.  Dispense: 90 tablet; Refill: 1  2. Gastroesophageal reflux disease, unspecified whether esophagitis present Chronic.  Controlled.  Stable.  Asymptomatic.  Patient has a history of bleeding ulcers and he is currently on pantoprazole  40 mg twice a day - pantoprazole  (PROTONIX ) 40 MG tablet; Take 1 tablet (40 mg total) by mouth 2 (two) times daily.  Dispense: 180 tablet; Refill: 1   Patient is followed by [facility] oncology Dr. Angelo Barge for small cell carcinoma and prostate cancer.  Patient is also followed by Dr. Shelvy Dickens at [facility] for his multinodular goiter..  Patient would like to have a geriatrician his primary care and would like to investigate the possibility of Dr. Spence Dux. Alayne Allis, MD

## 2023-10-14 ENCOUNTER — Ambulatory Visit: Payer: Medicare Other

## 2024-05-11 ENCOUNTER — Encounter
Admission: RE | Disposition: A | Discharge status: Home / Self Care | Payer: Self-pay | Source: Home / Self Care | Attending: Gastroenterology

## 2024-05-11 ENCOUNTER — Ambulatory Visit: Admitting: General Practice

## 2024-05-11 ENCOUNTER — Ambulatory Visit
Admission: RE | Admit: 2024-05-11 | Discharge: 2024-05-11 | Disposition: A | Discharge status: Home / Self Care | Attending: Gastroenterology | Admitting: Gastroenterology

## 2024-05-11 ENCOUNTER — Encounter: Payer: Self-pay | Admitting: Gastroenterology

## 2024-05-11 DIAGNOSIS — K644 Residual hemorrhoidal skin tags: Secondary | ICD-10-CM | POA: Insufficient documentation

## 2024-05-11 DIAGNOSIS — E119 Type 2 diabetes mellitus without complications: Secondary | ICD-10-CM | POA: Insufficient documentation

## 2024-05-11 DIAGNOSIS — I10 Essential (primary) hypertension: Secondary | ICD-10-CM | POA: Insufficient documentation

## 2024-05-11 DIAGNOSIS — D120 Benign neoplasm of cecum: Secondary | ICD-10-CM | POA: Diagnosis not present

## 2024-05-11 DIAGNOSIS — J449 Chronic obstructive pulmonary disease, unspecified: Secondary | ICD-10-CM | POA: Diagnosis not present

## 2024-05-11 DIAGNOSIS — Z1211 Encounter for screening for malignant neoplasm of colon: Secondary | ICD-10-CM | POA: Insufficient documentation

## 2024-05-11 DIAGNOSIS — Z7901 Long term (current) use of anticoagulants: Secondary | ICD-10-CM | POA: Diagnosis not present

## 2024-05-11 DIAGNOSIS — K5730 Diverticulosis of large intestine without perforation or abscess without bleeding: Secondary | ICD-10-CM | POA: Diagnosis not present

## 2024-05-11 DIAGNOSIS — K219 Gastro-esophageal reflux disease without esophagitis: Secondary | ICD-10-CM | POA: Diagnosis not present

## 2024-05-11 DIAGNOSIS — Z87891 Personal history of nicotine dependence: Secondary | ICD-10-CM | POA: Insufficient documentation

## 2024-05-11 DIAGNOSIS — K635 Polyp of colon: Secondary | ICD-10-CM | POA: Insufficient documentation

## 2024-05-11 HISTORY — PX: POLYPECTOMY: SHX149

## 2024-05-11 HISTORY — PX: SUBMUCOSAL INJECTION: SHX5543

## 2024-05-11 HISTORY — PX: COLONOSCOPY: SHX5424

## 2024-05-11 HISTORY — PX: HEMOSTASIS CLIP PLACEMENT: SHX6857

## 2024-05-11 SURGERY — COLONOSCOPY
Anesthesia: General

## 2024-05-11 MED ORDER — PROPOFOL 500 MG/50ML IV EMUL
INTRAVENOUS | Status: DC | PRN
  Administered 2024-05-11: 75 ug/kg/min via INTRAVENOUS

## 2024-05-11 MED ORDER — DEXMEDETOMIDINE HCL IN NACL 80 MCG/20ML IV SOLN
INTRAVENOUS | Status: DC | PRN
  Administered 2024-05-11: 12 ug via INTRAVENOUS
  Administered 2024-05-11 (×2): 4 ug via INTRAVENOUS

## 2024-05-11 MED ORDER — SODIUM CHLORIDE 0.9 % IV SOLN: INTRAVENOUS | Status: DC

## 2024-05-11 MED ORDER — LIDOCAINE HCL (CARDIAC) PF 100 MG/5ML IV SOSY
PREFILLED_SYRINGE | INTRAVENOUS | Status: DC | PRN
  Administered 2024-05-11: 80 mg via INTRAVENOUS

## 2024-05-11 MED ORDER — PROPOFOL 10 MG/ML IV BOLUS
INTRAVENOUS | Status: DC | PRN
  Administered 2024-05-11 (×2): 50 mg via INTRAVENOUS

## 2024-05-11 NOTE — Op Note (Addendum)
 [facility] Patient Name: Juan [name] Procedure Date: 05/11/2024 8:25 AM MRN: [id number] Account #: [id number] Date of Birth: 1950/10/14 Admit Type: Outpatient Age: 74 Room: [facility] ENDO ROOM 3 Gender: Male Note Status: Finalized Instrument Name: Colon Scope [id number] Procedure:             Colonoscopy Indications:           Surveillance: Personal history of adenomatous polyps                         on last colonoscopy 5 years ago, Last colonoscopy:                         June 2021 Providers:             Corinn Jess Brooklyn MD, MD Referring MD:          Salli Medicines:             General Anesthesia Complications:         No immediate complications. Estimated blood loss: None. Procedure:             Pre-Anesthesia Assessment:                        - Prior to the procedure, a History and Physical was                         performed, and patient medications and allergies were                         reviewed. The patient is competent. The risks and                         benefits of the procedure and the sedation options and                         risks were discussed with the patient. All questions                         were answered and informed consent was obtained.                         Patient identification and proposed procedure were                         verified by the physician, the nurse, the                         anesthesiologist, the anesthetist and the technician                         in the pre-procedure area in the procedure room in the                         endoscopy suite. Mental Status Examination: alert and                         oriented. Airway Examination: normal oropharyngeal  airway and neck mobility. Respiratory Examination:                         clear to auscultation. CV Examination: normal.                         Prophylactic Antibiotics: The patient does not require                          prophylactic antibiotics. Prior Anticoagulants: The                         patient has taken Eliquis (apixaban), last dose was 3                         days prior to procedure. ASA Grade Assessment: III - A                         patient with severe systemic disease. After reviewing                         the risks and benefits, the patient was deemed in                         satisfactory condition to undergo the procedure. The                         anesthesia plan was to use general anesthesia.                         Immediately prior to administration of medications,                         the patient was re-assessed for adequacy to receive                         sedatives. The heart rate, respiratory rate, oxygen                         saturations, blood pressure, adequacy of pulmonary                         ventilation, and response to care were monitored                         throughout the procedure. The physical status of the                         patient was re-assessed after the procedure.                        After obtaining informed consent, the colonoscope was                         passed under direct vision. Throughout the procedure,                         the patient's blood pressure, pulse, and oxygen  saturations were monitored continuously. The                         Colonoscope was introduced through the anus and                         advanced to the the cecum, identified by appendiceal                         orifice and ileocecal valve. The colonoscopy was                         performed with moderate difficulty due to significant                         looping. Successful completion of the procedure was                         aided by applying abdominal pressure. The patient                         tolerated the procedure well. The quality of the bowel                         preparation was evaluated  using the BBPS [facility] Bowel                         Preparation Scale) with scores of: Right Colon = 3,                         Transverse Colon = 3 and Left Colon = 3 (entire mucosa                         seen well with no residual staining, small fragments                         of stool or opaque liquid). The total BBPS score                         equals 9. The ileocecal valve, appendiceal orifice,                         and rectum were photographed. Findings:      The perianal and digital rectal examinations were normal. Pertinent       negatives include normal sphincter tone and no palpable rectal lesions.      Two sessile polyps were found in the cecum. The polyps were 5 to 6 mm in       size. These polyps were removed with a cold snare. Resection and       retrieval were complete. Estimated blood loss: none.      A 20 mm polyp was found in the proximal transverse colon. The polyp was       flat. Preparations were made for mucosal resection. Demarcation of the       lesion was performed with narrow band imaging to clearly identify the       boundaries of the lesion. Eleview was injected to raise the  lesion.       Snare mucosal resection was performed. Resection and retrieval were       complete. Resected tissue including tissue margins will be examined by       histology. To prevent bleeding after mucosal resection, two hemostatic       clips were successfully placed (MR safe). Clip manufacturer: [employer]. There was no bleeding during, or at the end, of the       procedure. Estimated blood loss: none.      Many small-mouthed diverticula were found in the recto-sigmoid colon and       sigmoid colon.      Non-bleeding external hemorrhoids were found during retroflexion. The       hemorrhoids were medium-sized. Impression:            - Two 5 to 6 mm polyps in the cecum, removed with a                         cold snare. Resected and retrieved.                         - One 20 mm polyp in the proximal transverse colon,                         removed with mucosal resection. Resected and                         retrieved. Clip manufacturer: [employer]. Clips                         (MR safe) were placed.                        - Diverticulosis in the recto-sigmoid colon and in the                         sigmoid colon.                        - Non-bleeding external hemorrhoids.                        - Mucosal resection was performed. Resection and                         retrieval were complete. Recommendation:        - Discharge patient to home (with escort).                        - Resume previous diet today.                        - Continue present medications.                        - Await pathology results.                        - Repeat colonoscopy in 1 year for surveillance after  piecemeal polypectomy.                        - Resume Eliquis (apixaban) at prior dose in 2 days.                         Refer to managing physician for further adjustment of                         therapy. Procedure Code(s):     --- Professional ---                        [phone], Colonoscopy, flexible; with endoscopic mucosal                         resection                        45385, 59, Colonoscopy, flexible; with removal of                         tumor(s), polyp(s), or other lesion(s) by snare                         technique Diagnosis Code(s):     --- Professional ---                        Z86.010, Personal history of colonic polyps                        D12.0, Benign neoplasm of cecum                        D12.3, Benign neoplasm of transverse colon (hepatic                         flexure or splenic flexure)                        K57.30, Diverticulosis of large intestine without                         perforation or abscess without bleeding                        K64.4, Residual hemorrhoidal skin tags CPT  copyright 2022 American Medical Association. All rights reserved. The codes documented in this report are preliminary and upon coder review may  be revised to meet current compliance requirements. Dr. Corinn Brooklyn Corinn Jess Brooklyn MD, MD 05/11/2024 9:22:12 AM This report has been signed electronically. Number of Addenda: 0 Note Initiated On: 05/11/2024 8:25 AM Scope Withdrawal Time: 0 hours 35 minutes 32 seconds  Total Procedure Duration: 0 hours 38 minutes 10 seconds  Estimated Blood Loss:  Estimated blood loss: none.      [place]

## 2024-05-11 NOTE — Anesthesia Postprocedure Evaluation (Signed)
"   Anesthesia Post Note  Patient: Juan [name]  Procedure(s) Performed: COLONOSCOPY POLYPECTOMY, INTESTINE INJECTION, SUBMUCOSAL  Patient location during evaluation: PACU Anesthesia Type: General Level of consciousness: awake and alert Pain management: pain level controlled Vital Signs Assessment: post-procedure vital signs reviewed and stable Respiratory status: spontaneous breathing, nonlabored ventilation, respiratory function stable and patient connected to nasal cannula oxygen Cardiovascular status: blood pressure returned to baseline and stable Postop Assessment: no apparent nausea or vomiting Anesthetic complications: no   No notable events documented.   Last Vitals:  Vitals:   05/11/24 0930 05/11/24 0940  BP: 125/74 136/73  Pulse: (!) 55 (!) 51  Resp: 19 10  Temp:    SpO2: 99% 100%    Last Pain:  Vitals:   05/11/24 0930  TempSrc:   PainSc: 0-No pain                 Debby [place]      "

## 2024-05-11 NOTE — Transfer of Care (Signed)
 Immediate Anesthesia Transfer of Care Note  Patient: Juan [name]  Procedure(s) Performed: COLONOSCOPY POLYPECTOMY, INTESTINE INJECTION, SUBMUCOSAL  Patient Location: PACU  Anesthesia Type:General  Level of Consciousness: sedated  Airway & Oxygen Therapy: Patient Spontanous Breathing  Post-op Assessment: Report given to RN and Post -op Vital signs reviewed and stable  Post vital signs: Reviewed and stable  Last Vitals:  Vitals Value Taken Time  BP    Temp    Pulse 56 05/11/24 09:20  Resp 17 05/11/24 09:20  SpO2 100 % 05/11/24 09:20  Vitals shown include unfiled device data.  Last Pain:  Vitals:   05/11/24 0744  TempSrc: Temporal  PainSc: 0-No pain         Complications: No notable events documented.

## 2024-05-11 NOTE — Anesthesia Preprocedure Evaluation (Signed)
 "                                  Anesthesia Evaluation  Patient identified by MRN, date of birth, ID band Patient awake    Reviewed: Allergy & Precautions, NPO status , Patient's Chart, lab work & pertinent test results  History of Anesthesia Complications Negative for: history of anesthetic complications  Airway Mallampati: II  TM Distance: >3 FB Neck ROM: Full    Dental no notable dental hx. (+) Chipped   Pulmonary COPD, former smoker   Pulmonary exam normal        Cardiovascular hypertension, Pt. on medications Normal cardiovascular exam     Neuro/Psych negative neurological ROS  negative psych ROS   GI/Hepatic Neg liver ROS, PUD,GERD  ,,  Endo/Other  diabetes    Renal/GU negative Renal ROS  negative genitourinary   Musculoskeletal   Abdominal   Peds  Hematology negative hematology ROS (+)   Anesthesia Other Findings Past Medical History: 08/28/2016: Arthritis 01/13/2013: Atrophic kidney     Comment:  Last Assessment & Plan:  Atrophic and nonfunctional LEFT              kidney.  Discussed at length today. Asymptomatic. No               intervention needed.  Overview:  Last Assessment & Plan:               Atrophic and nonfunctional LEFT kidney.  Discussed at               length today. Asymptomatic. No intervention needed. Last               Assessment & Plan:  Atrophic and nonfunctional LEFT               kidney.  Discussed at length today. Asymptomatic. No               intervention needed. No date: Cancer (HCC) 08/28/2016: COPD (chronic obstructive pulmonary disease) (HCC) 08/28/2016: Diabetes mellitus type 2, uncomplicated (HCC)     Comment:  diet controlled 08/28/2016: ED (erectile dysfunction) No date: GERD (gastroesophageal reflux disease) 07/22/2016: [place] hematuria     Comment:  Overview:  Last Assessment & Plan:  The differential               diagnosis for hematuria was reviewed.  Possible               etiologies include, but  are not limited to urolithiasis,               infection, urothelial or renal malignancies, medical               renal disease, and benign idiopathic hematuria.  The               workup was reviewed and he will need a urinary cytology               testing, a CT urogram, and Cystourethroscopy.  He has               moved to [place], [place] and would like to have all this               done with a local urologist since it is an hour and a  half drive. Will send records there and have the work               performed locally.   > 15 minutes were spent in face to               face contact with the patient, >50% of which was spent               counseling about about the diagnosis and plan.  Last               Assessment & Plan:  The differential diagnosis for               hematuria was reviewed.  Possible etiologies include, but              are not limited to urolithiasis, infection, urothelial or              renal malignancies, medical renal disease, and benign               idiopathic hematuria.  The workup was reviewed and he               will need a urinary cytology testing,  10/11/2012: H/O vagotomy No date: Hyperlipemia 08/28/2016: Hypertension     Comment:  (Pt denies, Lisinopril  for kidney protection) 08/28/2016: Kidney stones     Comment:  Last Assessment & Plan:  Can't see stone on KUB today               again  Pt instructed to call or go to ER for acute flank              pain since he only has one functioning kidney.                Currently no evidence of any obstructive uropathy.  Check              chem 8 and call with results.  F/u one year for KUB 10/20/2013: Knee contusion 08/28/2016: Peptic ulcer No date: Peripheral neuropathy No date: Prostate cancer (HCC) 04/04/2015: Sarcoidosis 10/11/2012: Status post partial gastrectomy No date: Thyroid  nodule No date: Wears dentures     Comment:  partial upper and lower No date: Wears hearing aid in both  ears  Past Surgical History: No date: APPENDECTOMY No date: CHOLECYSTECTOMY 10/30/2019: COLONOSCOPY WITH PROPOFOL ; N/A     Comment:  Procedure: COLONOSCOPY WITH BIOPSY;  Surgeon: Jinny Carmine, MD;  Location[facility];  Service:               Endoscopy;  Laterality: N/A;  Diabetic - diet               controlled priority 4 No date: EYE SURGERY     Comment:  bialteral cataract extraction No date: GASTRECTOMY 2002: INSERTION PROSTATE RADIATION SEED     Comment:  Brachytherapy 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 10/30/2019: POLYPECTOMY; N/A     Comment:  Procedure: POLYPECTOMY;  Surgeon: Jinny Carmine, MD;                Location: [facility];  Service: Endoscopy;                Laterality: N/A;  BMI    Body Mass Index: 32.64 kg/m      Reproductive/Obstetrics negative OB ROS  Anesthesia Physical Anesthesia Plan  ASA: 3  Anesthesia Plan: General   Post-op Pain Management: Minimal or no pain anticipated   Induction: Intravenous  PONV Risk Score and Plan: 2 and Propofol  infusion and TIVA  Airway Management Planned: Nasal Cannula  Additional Equipment: None  Intra-op Plan:   Post-operative Plan:   Informed Consent: I have reviewed the patients History and Physical, chart, labs and discussed the procedure including the risks, benefits and alternatives for the proposed anesthesia with the patient or authorized representative who has indicated his/her understanding and acceptance.     Dental advisory given  Plan Discussed with: CRNA and Surgeon  Anesthesia Plan Comments: (Discussed risks of anesthesia with patient, including possibility of difficulty with spontaneous ventilation under anesthesia necessitating airway intervention, PONV, and rare risks such as cardiac or respiratory or neurological events, and allergic reactions. Discussed the role of CRNA in patient's perioperative care. Patient  understands.)        Anesthesia Quick Evaluation  "

## 2024-05-11 NOTE — H&P (Addendum)
 "   Juan JONELLE Brooklyn, MD [facility], [facility]IP [address]  [place], [place] [address]  Main: [phone] Fax:  [phone] Pager: [phone]   Primary Care Physician:  Salli Amato, MD Primary Gastroenterologist:  Dr. Corinn JONELLE [name]  Pre-Procedure History & Physical: HPI:  Juan [name] is a 74 y.o. male is here for an colonoscopy.   Past Medical History:  Diagnosis Date   Arthritis 08/28/2016   Atrophic kidney 01/13/2013   Last Assessment & Plan:  Atrophic and nonfunctional LEFT kidney.  Discussed at length today. Asymptomatic. No intervention needed.  Overview:  Last Assessment & Plan:  Atrophic and nonfunctional LEFT kidney.  Discussed at length today. Asymptomatic. No intervention needed. Last Assessment & Plan:  Atrophic and nonfunctional LEFT kidney.  Discussed at length today. Asymptomatic. No intervention needed.   Cancer [facility])    COPD (chronic obstructive pulmonary disease) (HCC) 08/28/2016   Diabetes mellitus type 2, uncomplicated (HCC) 08/28/2016   diet controlled   ED (erectile dysfunction) 08/28/2016   GERD (gastroesophageal reflux disease)    Gross hematuria 07/22/2016   Overview:  Last Assessment & Plan:  The differential diagnosis for hematuria was reviewed.  Possible etiologies include, but are not limited to urolithiasis, infection, urothelial or renal malignancies, medical renal disease, and benign idiopathic hematuria.  The workup was reviewed and he will need a urinary cytology testing, a CT urogram, and Cystourethroscopy.  He has moved to [place], [place] and would like to have all this done with a local urologist since it is an hour and a half drive. Will send records there and have the work performed locally.   > 15 minutes were spent in face to face contact with the patient, >50% of which was spent counseling about about the diagnosis and plan.  Last Assessment & Plan:  The differential diagnosis for hematuria was reviewed.  Possible  etiologies include, but are not limited to urolithiasis, infection, urothelial or renal malignancies, medical renal disease, and benign idiopathic hematuria.  The workup was reviewed and he will need a urinary cytology testing,    H/O vagotomy 10/11/2012   Hyperlipemia    Hypertension 08/28/2016   (Pt denies, Lisinopril  for kidney protection)   Kidney stones 08/28/2016   Last Assessment & Plan:  Can't see stone on KUB today again   Pt instructed to call or go to ER for acute flank pain since he only has one functioning kidney.   Currently no evidence of any obstructive uropathy.  Check chem 8 and call with results.   F/u one year for KUB   Knee contusion 10/20/2013   Peptic ulcer 08/28/2016   Peripheral neuropathy    Prostate cancer [facility])    Sarcoidosis 04/04/2015   Status post partial gastrectomy 10/11/2012   Thyroid  nodule    Wears dentures    partial upper and lower   Wears hearing aid in both ears     Past Surgical History:  Procedure Laterality Date   APPENDECTOMY     CHOLECYSTECTOMY     COLONOSCOPY WITH PROPOFOL  N/A 10/30/2019   Procedure: COLONOSCOPY WITH BIOPSY;  Surgeon: Jinny Carmine, MD;  Location: [facility];  Service: Endoscopy;  Laterality: N/A;  Diabetic - diet controlled priority 4   EYE SURGERY     bialteral cataract extraction   GASTRECTOMY     INSERTION PROSTATE RADIATION SEED  2002   Brachytherapy   nasal tumor     POLYPECTOMY N/A 10/30/2019   Procedure: POLYPECTOMY;  Surgeon: Jinny,  Darren, MD;  Location: [facility];  Service: Endoscopy;  Laterality: N/A;    Prior to Admission medications  Medication Sig Start Date End Date Taking? Authorizing Provider  Cyanocobalamin  (B-12) 500 MCG TABS Take 1 tablet by mouth daily. 03/02/17  Yes Plonk, Elsie, MD  darolutamide (NUBEQA) 300 MG tablet Take 600 mg by mouth 2 (two) times daily. berry   Yes [provider]  dutasteride (AVODART) 0.5 MG capsule Take 0.5 mg by mouth daily. Dr Court   Yes  [provider]  lisinopril  (ZESTRIL ) 5 MG tablet Take 1 tablet (5 mg total) by mouth daily. 09/09/23  Yes Joshua Cathryne BROCKS, MD  Multiple Vitamin (MULTI-VITAMINS) TABS Take by mouth.   Yes [provider]  pantoprazole  (PROTONIX ) 40 MG tablet Take 1 tablet (40 mg total) by mouth 2 (two) times daily. 09/09/23  Yes Joshua Cathryne BROCKS, MD  acetaminophen (TYLENOL) 500 MG tablet Take 1,000 mg by mouth 2 (two) times daily as needed for pain.    [provider]  apixaban (ELIQUIS) 5 MG TABS tablet Take 5 mg by mouth 2 (two) times daily. 07/19/23   [provider]  Biotin 1000 MCG tablet Take by mouth.    [provider]  Cholecalciferol  (GNP VITAMIN D  MAXIMUM STRENGTH) 2000 units TABS Take 1 tablet (2,000 Units total) by mouth daily. 03/02/17   Plonk, Elsie, MD  leuprolide  (LUPRON ) 22.5 MG injection Inject into the muscle. Dr Court- cancer center    [provider]    Allergies as of 04/11/2024 - Review Complete 09/09/2023  Allergen Reaction Noted   Nsaids Other (See Comments) 03/16/2016   Statins Rash 08/29/2014    Family History  Problem Relation Age of Onset   Cancer Mother        lung   Diabetes Father    Congestive Heart Failure Father    Cancer Brother    Prostate cancer Neg Hx    Kidney cancer Neg Hx     Social History   Socioeconomic History   Marital status: Divorced    Spouse name: Not on file   Number of children: 2   Years of education: Not on file   Highest education level: Bachelor's degree (e.g., BA, AB, BS)  Occupational History    Employer: [employer]  Tobacco Use   Smoking status: Former    Current packs/day: 0.00    Average packs/day: 2.0 packs/day for 35.0 years (70.0 ttl pk-yrs)    Types: Cigarettes    Start date: 05/04/1961    Quit date: 05/04/1996    Years since quitting: 28.0   Smokeless tobacco: Never   Tobacco comments:    smoking cessation materials not required  Vaping Use   Vaping status: Never Used   Substance and Sexual Activity   Alcohol use: Yes    Comment: Occasional beer   Drug use: No   Sexual activity: Not Currently    Birth control/protection: None  Other Topics Concern   Not on file  Social History Narrative   Pt's sister lives with him   Social Drivers of Health   Tobacco Use: Medium Risk (05/11/2024)   Patient History    Smoking Tobacco Use: Former    Smokeless Tobacco Use: Never    Passive Exposure: Not on file  Financial Resource Strain: Low Risk  (04/11/2024)   Received from [facility]   Overall Financial Resource Strain (CARDIA)    Difficulty of Paying Living Expenses: Not hard at all  Food Insecurity: No Food Insecurity (04/11/2024)   Received from [facility]   Epic    Within the past 12 months, you worried that your food would run out before you got the money to buy more.: Never true    Within the past 12 months, the food you bought just didn't last and you didn't have money to get more.: Never true  Transportation Needs: No Transportation Needs (04/11/2024)   Received from [facility] - Transportation    In the past 12 months, has lack of transportation kept you from medical appointments or from getting medications?: No    Lack of Transportation (Non-Medical): No  Physical Activity: Sufficiently Active (09/05/2023)   Exercise Vital Sign    Days of Exercise per Week: 5 days    Minutes of Exercise per Session: 30 min  Stress: No Stress Concern Present (09/05/2023)   [employer] - Occupational Stress Questionnaire    Feeling of Stress : Only a little  Social Connections: Socially Isolated (09/05/2023)   Social Connection and Isolation Panel    Frequency of Communication with Friends and Family: Once a week    Frequency of Social Gatherings with Friends and Family: More than three times a week    Attends Religious Services: Never    Database Administrator or  Organizations: No    Attends Engineer, Structural: Not on file    Marital Status: Divorced  Intimate Partner Violence: Not At Risk (10/08/2022)   Humiliation, Afraid, Rape, and Kick questionnaire    Fear of Current or Ex-Partner: No    Emotionally Abused: No    Physically Abused: No    Sexually Abused: No  Depression (PHQ2-9): Medium Risk (09/09/2023)   Depression (PHQ2-9)    PHQ-2 Score: 6  Alcohol Screen: Low Risk (09/05/2023)   Alcohol Screen    Last Alcohol Screening Score (AUDIT): 1  Housing: Low Risk  (04/11/2024)   Received from [facility]   Epic    In the last 12 months, was there a time when you were not able to pay the mortgage or rent on time?: No    In the past 12 months, how many times have you moved where you were living?: 0    At any time in the past 12 months, were you homeless or living in a shelter (including now)?: No  Utilities: Not At Risk (04/11/2024)   Received from [facility]   Epic    In the past 12 months has the electric, gas, oil, or water  company threatened to shut off services in your home?: No  Health Literacy: Not on file    Review of Systems: See HPI, otherwise negative ROS  Physical Exam: BP 127/81   Pulse 63   Temp (!) 97.3 F (36.3 C) (Temporal)   Resp 17   Ht 5' 7 (1.702 m)   Wt 94.5 kg   SpO2 98%   BMI 32.64 kg/m  General:   Alert,  pleasant and cooperative in NAD Head:  Normocephalic and atraumatic. Neck:  Supple; no masses or thyromegaly. Lungs:  Clear throughout to auscultation.    Heart:  Regular rate and rhythm. Abdomen:  Soft, nontender and nondistended. Normal bowel sounds, without guarding, and without rebound.   Neurologic:  Alert and  oriented x4;  grossly normal neurologically.  Impression/Plan: Juan [name] is here for an colonoscopy to be performed for h/o colon adenomas  Risks, benefits, limitations, and alternatives regarding  colonoscopy have been reviewed with the  patient.  Questions have been answered.  All parties agreeable.   Juan Brooklyn, MD  05/11/2024, 8:02 AM "

## 2024-05-12 LAB — SURGICAL PATHOLOGY

## 2024-05-17 ENCOUNTER — Ambulatory Visit: Payer: Self-pay | Admitting: Gastroenterology
